# Patient Record
Sex: Male | Born: 1950 | Race: White | Hispanic: No | Marital: Single | Smoking: Former smoker
Health system: Southern US, Community
[De-identification: ages and names within clinical notes are randomized; demographics above are authoritative.]

## PROBLEM LIST (undated history)

## (undated) DIAGNOSIS — K219 Gastro-esophageal reflux disease without esophagitis: Secondary | ICD-10-CM

## (undated) DIAGNOSIS — IMO0001 Reserved for inherently not codable concepts without codable children: Secondary | ICD-10-CM

## (undated) DIAGNOSIS — F17200 Nicotine dependence, unspecified, uncomplicated: Secondary | ICD-10-CM

## (undated) DIAGNOSIS — I639 Cerebral infarction, unspecified: Secondary | ICD-10-CM

## (undated) DIAGNOSIS — F419 Anxiety disorder, unspecified: Secondary | ICD-10-CM

## (undated) DIAGNOSIS — I509 Heart failure, unspecified: Secondary | ICD-10-CM

## (undated) HISTORY — PX: LEG SURGERY: SHX1003

## (undated) HISTORY — PX: SPINE SURGERY: SHX786

---

## 2007-08-19 ENCOUNTER — Inpatient Hospital Stay (HOSPITAL_COMMUNITY): Admission: EM | Admit: 2007-08-19 | Discharge: 2007-08-23 | Payer: Self-pay | Admitting: Emergency Medicine

## 2007-08-22 ENCOUNTER — Ambulatory Visit: Payer: Self-pay | Admitting: Physical Medicine & Rehabilitation

## 2007-10-30 ENCOUNTER — Encounter: Admission: RE | Admit: 2007-10-30 | Discharge: 2007-11-28 | Payer: Self-pay | Admitting: Neurosurgery

## 2009-12-06 ENCOUNTER — Emergency Department (HOSPITAL_BASED_OUTPATIENT_CLINIC_OR_DEPARTMENT_OTHER): Admission: EM | Admit: 2009-12-06 | Discharge: 2009-12-06 | Payer: Self-pay | Admitting: Emergency Medicine

## 2009-12-06 ENCOUNTER — Ambulatory Visit: Payer: Self-pay | Admitting: Diagnostic Radiology

## 2010-01-11 ENCOUNTER — Encounter: Admission: RE | Admit: 2010-01-11 | Discharge: 2010-03-05 | Payer: Self-pay | Admitting: Orthopedic Surgery

## 2010-11-09 NOTE — Op Note (Signed)
[name] [name], [name] [name]                  ACCOUNT NO.:  [id number]   MEDICAL RECORD NO.:  [id number]          PATIENT TYPE:  INP   LOCATION:  3002                         FACILITY:  [facility]   PHYSICIAN:  Clydene Fake, M.D.  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   PROCEDURE:  Anterior cervical decompression, diskectomy, and fusion at  C3-4, with LifeNet allograft bone.  Trestle anterior cervical plate.   SURGEON:  Clydene Fake, M.D.   ASSISTANT:  Stefani Dama, M.D.   ANESTHESIA:  General endotracheal tube anesthesia.   ESTIMATED BLOOD LOSS:  Minimal.   BLOOD GIVEN:  None.   DRAINS:  None.   COMPLICATIONS:  None.   REASON FOR PROCEDURE:  The patient is a 60 year old gentleman who has  had progressive myelopathy.  He had a fall over the weekend and hit the  back of his head that prompted medical attention, and myelopathy was  found.  Workup included MRI of the cervical spine showing spondylytic  change, disc herniation causing cord compression and cord edema at C3-4,  and the patient is now admitted and now brought in for decompression.   PROCEDURE IN DETAIL:  The patient was brought into the operating room,  and anesthesia was induced.  The patient was placed in 10 pounds halter  traction, prepped, draped in a sterile fashion.  Site of incision was  injected with 10 mL of 1% lidocaine with epinephrine.  Incision was then  made from the midline to the anterior border of the sternocleidomastoid  muscle on the left side of the neck.  Incision taken down to the  platysma, and hemostasis obtained with Bovie cauterization.  The  platysma was incised with the Bovie, and blunt dissection  taken through  the anterior cervical fascia  to the anterior cervical spine.  Needle  was placed in the interspace.  X-rays were obtained showing we were at  the C3-4 interspace.  The disc space was incised with a 15 blade as the  needle was removed, and partial diskectomy performed with pituitary  rongeurs.  Longus colli muscle was reflected laterally on each side  using the Bovie, and self-retaining retractors were placed.  Diskectomy  continue with pituitary rongeurs, curettes, and Kerrison punches used to  remove anterior osteophytes.  Distraction pins placed in C3-4 and the  interspace distracted.  The microscope was brought in for  microdissection, and diskectomy continued with pituitary rongeurs and  the curettes.  1 and 2 mm Kerrison punches were used to remove posterior  disc, ligament, and osteophytes, decompressing the central canal and  performing bilateral foraminotomies.  Significant disk herniation to the  right side of the right foramen was seen.  This was all removed.  When  we were finished, we had good central decompression of the spinal cord,  and the  bilateral foramen were opened.  We used a high-speed drill to  remove cartilaginous endplate and prepared for interbody fusion.  We  measured the height of the disc space to be 6 mm, and a 6 mm LifeNet  allograft bone was tapped into place, countersunk a couple millimeters.  The distraction pins were removed.  Hemostasis obtained with Gelfoam and  thrombin.  The weight was removed from the traction.  A Trestle anterior  cervical plate was placed over the anterior cervical spine, and two  screws placed in the C3, and two in the C4.  These were tightened down.  Lateral x-rays were obtained showing good position of plates, screws,  and bone plug.  We had good hemostasis.  The wound was irrigated with  antibiotic solution, and the platysma was closed with 3-0 Vicryl with  interrupted sutures.  Subcutaneous tissue was closed  with the same.  The  skin was closed with benzoin and Steri-Strips.  Dressing was placed.  The patient was placed in a soft cervical collar, awakened from  anesthesia, and transferred to the recovery room in stable condition.           ______________________________  Clydene Fake, M.D.     JRH/MEDQ  D:  08/21/2007  T:  08/22/2007  Job:  [id number]

## 2010-11-12 NOTE — Discharge Summary (Signed)
[name], [name] [name]                  ACCOUNT NO.:  [id number]   MEDICAL RECORD NO.:  [id number]          PATIENT TYPE:  INP   LOCATION:  3002                         FACILITY:  [facility]   PHYSICIAN:  Clydene Fake, M.D.  DATE OF BIRTH:  [birth year]   DATE OF ADMISSION:  08/19/2007  DATE OF DISCHARGE:  08/23/2007                               DISCHARGE SUMMARY   ADMITTING DIAGNOSES:  1. Cervical stenosis.  2. Spondylosis.  3. Cord compression.  4. Myelopathy.   DISCHARGE DIAGNOSES:  1. Cervical stenosis.  2. Spondylosis.  3. Cord compression.  4. Myelopathy.   PROCEDURE:  Intracervical discectomy and fusion C3-C4 LifeNet bone  trestle anterior cervical plate.   REASON FOR ADMISSION:  The patient is a 60 year old white male with long  history of progressive upper and lower extremity clumsiness, weakness,  worsening over the last 3-4 weeks prior to admission.  He had a fall on  day of admission with a laceration.  Actually  he was found to be  extremely spastic when brought to the emergency room.  He had an MRI of  C-spine that was done.  CT of the head was negative.  C-spine showed  multiple degenerative changes with severe stenosis at C3-C4 with cord  compression, cord change, and the patient was admitted for IV steroids,  then prepared for surgical  intervention.   HOSPITAL COURSE:  The patient was admitted and remained stable on the  September 17, 2007, and on September 18, 2007, he underwent surgical  intervention of ACF and C3-C4 and did well.  Postop, he was transferred  to recovery room, then to floor.  On September 19, 2007, he was swallowing  well.  Voicewas doing well.  He was still _________ but he was moving  upper and lower extremities slightly better.  Worked on PT/OT consults,  trying to increase activity and he made some progress there.  Rehab was  consulted,  but thought he was doing too well and thought he would do  well with  home therapy.  On September 20, 2007, rapid  alternating  movements were improved, gait was steady, but improved.  Incision was  clean, dry, and intact.  He was eating well.  He was discharged to home  in a stable condition.  He was discharged with home PT, rolling walker;  medications are:  Wellbutrin, Percocet p.r.n., Flexeril p.r.n.  Follow  up will be in 3-4 weeks in our office.   Note:  His history of alcohol abuse and significant alcohol use so was  treated with alcohol withdrawal protocol during the hospitalization with  no problems.          ______________________________  Clydene Fake, M.D.    JRH/MEDQ  D:  09/20/2007  T:  09/21/2007  Job:  [id number]

## 2011-03-21 LAB — BASIC METABOLIC PANEL
BUN: 14
CO2: 24
Calcium: 8.9
Calcium: 9.4
Chloride: 106
Creatinine, Ser: 0.7
Creatinine, Ser: 0.71
GFR calc non Af Amer: >60
Glucose, Bld: 100 — ABNORMAL HIGH
Sodium: 139

## 2011-03-21 LAB — I-STAT 8, (EC8 V) (CONVERTED LAB)
Acid-base deficit: 2
BUN: 14
Glucose, Bld: 89
Operator id: 284141
Sodium: 136
TCO2: 20
pCO2, Ven: 26.4 — ABNORMAL LOW
pH, Ven: 7.475 — ABNORMAL HIGH

## 2011-03-21 LAB — PROTIME-INR: Prothrombin Time: 12.8

## 2011-03-21 LAB — APTT: aPTT: 26

## 2011-03-21 LAB — POCT CARDIAC MARKERS
Myoglobin, poc: 227
Operator id: 284141

## 2011-03-21 LAB — CBC
Hemoglobin: 14.1
MCV: 101.5 — ABNORMAL HIGH
RBC: 3.98 — ABNORMAL LOW
RDW: 12.7

## 2011-03-21 LAB — POCT I-STAT CREATININE
Creatinine, Ser: 0.9
Operator id: 284141

## 2014-07-21 ENCOUNTER — Inpatient Hospital Stay (HOSPITAL_COMMUNITY)
Admission: EM | Admit: 2014-07-21 | Discharge: 2014-08-04 | DRG: 291 | Disposition: A | Discharge status: Home Health | Payer: Self-pay | Attending: Family Medicine | Admitting: Family Medicine

## 2014-07-21 ENCOUNTER — Encounter (HOSPITAL_COMMUNITY): Payer: Self-pay | Admitting: Emergency Medicine

## 2014-07-21 ENCOUNTER — Emergency Department (HOSPITAL_COMMUNITY): Payer: Self-pay

## 2014-07-21 DIAGNOSIS — D539 Nutritional anemia, unspecified: Secondary | ICD-10-CM | POA: Diagnosis present

## 2014-07-21 DIAGNOSIS — E46 Unspecified protein-calorie malnutrition: Secondary | ICD-10-CM | POA: Diagnosis present

## 2014-07-21 DIAGNOSIS — F1010 Alcohol abuse, uncomplicated: Secondary | ICD-10-CM | POA: Insufficient documentation

## 2014-07-21 DIAGNOSIS — I10 Essential (primary) hypertension: Secondary | ICD-10-CM | POA: Diagnosis present

## 2014-07-21 DIAGNOSIS — D6959 Other secondary thrombocytopenia: Secondary | ICD-10-CM | POA: Diagnosis present

## 2014-07-21 DIAGNOSIS — Z8249 Family history of ischemic heart disease and other diseases of the circulatory system: Secondary | ICD-10-CM

## 2014-07-21 DIAGNOSIS — J9601 Acute respiratory failure with hypoxia: Secondary | ICD-10-CM | POA: Diagnosis present

## 2014-07-21 DIAGNOSIS — I248 Other forms of acute ischemic heart disease: Secondary | ICD-10-CM | POA: Diagnosis present

## 2014-07-21 DIAGNOSIS — R0602 Shortness of breath: Secondary | ICD-10-CM | POA: Insufficient documentation

## 2014-07-21 DIAGNOSIS — R0600 Dyspnea, unspecified: Secondary | ICD-10-CM

## 2014-07-21 DIAGNOSIS — Z452 Encounter for adjustment and management of vascular access device: Secondary | ICD-10-CM

## 2014-07-21 DIAGNOSIS — R404 Transient alteration of awareness: Secondary | ICD-10-CM | POA: Insufficient documentation

## 2014-07-21 DIAGNOSIS — N179 Acute kidney failure, unspecified: Secondary | ICD-10-CM | POA: Diagnosis not present

## 2014-07-21 DIAGNOSIS — E874 Mixed disorder of acid-base balance: Secondary | ICD-10-CM | POA: Diagnosis not present

## 2014-07-21 DIAGNOSIS — F04 Amnestic disorder due to known physiological condition: Secondary | ICD-10-CM | POA: Insufficient documentation

## 2014-07-21 DIAGNOSIS — J449 Chronic obstructive pulmonary disease, unspecified: Secondary | ICD-10-CM | POA: Diagnosis present

## 2014-07-21 DIAGNOSIS — R9431 Abnormal electrocardiogram [ECG] [EKG]: Secondary | ICD-10-CM | POA: Insufficient documentation

## 2014-07-21 DIAGNOSIS — E876 Hypokalemia: Secondary | ICD-10-CM | POA: Diagnosis not present

## 2014-07-21 DIAGNOSIS — F17210 Nicotine dependence, cigarettes, uncomplicated: Secondary | ICD-10-CM | POA: Diagnosis present

## 2014-07-21 DIAGNOSIS — J9600 Acute respiratory failure, unspecified whether with hypoxia or hypercapnia: Secondary | ICD-10-CM

## 2014-07-21 DIAGNOSIS — I426 Alcoholic cardiomyopathy: Secondary | ICD-10-CM | POA: Diagnosis present

## 2014-07-21 DIAGNOSIS — F10239 Alcohol dependence with withdrawal, unspecified: Secondary | ICD-10-CM | POA: Diagnosis not present

## 2014-07-21 DIAGNOSIS — E162 Hypoglycemia, unspecified: Secondary | ICD-10-CM | POA: Diagnosis not present

## 2014-07-21 DIAGNOSIS — Z6824 Body mass index (BMI) 24.0-24.9, adult: Secondary | ICD-10-CM

## 2014-07-21 DIAGNOSIS — J9690 Respiratory failure, unspecified, unspecified whether with hypoxia or hypercapnia: Secondary | ICD-10-CM

## 2014-07-21 DIAGNOSIS — G9341 Metabolic encephalopathy: Secondary | ICD-10-CM | POA: Diagnosis not present

## 2014-07-21 DIAGNOSIS — I4581 Long QT syndrome: Secondary | ICD-10-CM | POA: Diagnosis not present

## 2014-07-21 DIAGNOSIS — F10259 Alcohol dependence with alcohol-induced psychotic disorder, unspecified: Secondary | ICD-10-CM | POA: Diagnosis present

## 2014-07-21 DIAGNOSIS — J189 Pneumonia, unspecified organism: Secondary | ICD-10-CM | POA: Insufficient documentation

## 2014-07-21 DIAGNOSIS — I4891 Unspecified atrial fibrillation: Secondary | ICD-10-CM | POA: Diagnosis not present

## 2014-07-21 DIAGNOSIS — F10939 Alcohol use, unspecified with withdrawal, unspecified: Secondary | ICD-10-CM

## 2014-07-21 DIAGNOSIS — R4182 Altered mental status, unspecified: Secondary | ICD-10-CM

## 2014-07-21 DIAGNOSIS — I5023 Acute on chronic systolic (congestive) heart failure: Principal | ICD-10-CM | POA: Diagnosis present

## 2014-07-21 DIAGNOSIS — I509 Heart failure, unspecified: Secondary | ICD-10-CM

## 2014-07-21 DIAGNOSIS — J811 Chronic pulmonary edema: Secondary | ICD-10-CM

## 2014-07-21 DIAGNOSIS — D696 Thrombocytopenia, unspecified: Secondary | ICD-10-CM | POA: Insufficient documentation

## 2014-07-21 HISTORY — DX: Gastro-esophageal reflux disease without esophagitis: K21.9

## 2014-07-21 HISTORY — DX: Heart failure, unspecified: I50.9

## 2014-07-21 HISTORY — DX: Nicotine dependence, unspecified, uncomplicated: F17.200

## 2014-07-21 HISTORY — DX: Anxiety disorder, unspecified: F41.9

## 2014-07-21 HISTORY — DX: Reserved for inherently not codable concepts without codable children: IMO0001

## 2014-07-21 HISTORY — DX: Cerebral infarction, unspecified: I63.9

## 2014-07-21 LAB — COMPREHENSIVE METABOLIC PANEL
ALT: 30 U/L (ref 0–53)
ANION GAP: 11 (ref 5–15)
AST: 51 U/L — ABNORMAL HIGH (ref 0–37)
Albumin: 3.3 g/dL — ABNORMAL LOW (ref 3.5–5.2)
Alkaline Phosphatase: 78 U/L (ref 39–117)
BUN: 42 mg/dL — AB (ref 6–23)
CALCIUM: 9.3 mg/dL (ref 8.4–10.5)
CHLORIDE: 95 mmol/L — AB (ref 96–112)
CO2: 32 mmol/L (ref 19–32)
Creatinine, Ser: 1.31 mg/dL (ref 0.50–1.35)
GFR calc Af Amer: 65 mL/min — ABNORMAL LOW (ref >90)
GFR calc non Af Amer: 56 mL/min — ABNORMAL LOW (ref >90)
Glucose, Bld: 106 mg/dL — ABNORMAL HIGH (ref 70–99)
Potassium: 4 mmol/L (ref 3.5–5.1)
SODIUM: 138 mmol/L (ref 135–145)
TOTAL PROTEIN: 7.8 g/dL (ref 6.0–8.3)
Total Bilirubin: 2 mg/dL — ABNORMAL HIGH (ref 0.3–1.2)

## 2014-07-21 LAB — I-STAT ARTERIAL BLOOD GAS, ED
ACID-BASE EXCESS: 4 mmol/L — AB (ref 0.0–2.0)
Bicarbonate: 33.7 mEq/L — ABNORMAL HIGH (ref 20.0–24.0)
O2 Saturation: 97 %
PO2 ART: 98 mmHg (ref 80.0–100.0)
Patient temperature: 98.6
TCO2: 36 mmol/L (ref 0–100)
pCO2 arterial: 66.6 mmHg (ref 35.0–45.0)
pH, Arterial: 7.312 — ABNORMAL LOW (ref 7.350–7.450)

## 2014-07-21 LAB — CBC WITH DIFFERENTIAL/PLATELET
Basophils Absolute: 0 10*3/uL (ref 0.0–0.1)
Basophils Relative: 0 % (ref 0–1)
EOS ABS: 0 10*3/uL (ref 0.0–0.7)
EOS PCT: 0 % (ref 0–5)
HCT: 50.6 % (ref 39.0–52.0)
HEMOGLOBIN: 17.2 g/dL — AB (ref 13.0–17.0)
Lymphocytes Relative: 10 % — ABNORMAL LOW (ref 12–46)
Lymphs Abs: 0.9 10*3/uL (ref 0.7–4.0)
MCH: 36.6 pg — ABNORMAL HIGH (ref 26.0–34.0)
MCHC: 34 g/dL (ref 30.0–36.0)
MCV: 107.7 fL — AB (ref 78.0–100.0)
MONO ABS: 1 10*3/uL (ref 0.1–1.0)
Monocytes Relative: 12 % (ref 3–12)
Neutro Abs: 6.7 10*3/uL (ref 1.7–7.7)
Neutrophils Relative %: 78 % — ABNORMAL HIGH (ref 43–77)
PLATELETS: 127 10*3/uL — AB (ref 150–400)
RBC: 4.7 MIL/uL (ref 4.22–5.81)
RDW: 14.4 % (ref 11.5–15.5)
WBC: 8.5 10*3/uL (ref 4.0–10.5)

## 2014-07-21 LAB — AMMONIA: Ammonia: 21 umol/L (ref 11–32)

## 2014-07-21 LAB — PROTIME-INR
INR: 1.35 (ref 0.00–1.49)
PROTHROMBIN TIME: 16.8 s — AB (ref 11.6–15.2)

## 2014-07-21 LAB — TROPONIN I: Troponin I: 0.21 ng/mL — ABNORMAL HIGH (ref <0.031)

## 2014-07-21 LAB — BRAIN NATRIURETIC PEPTIDE: B NATRIURETIC PEPTIDE 5: 1685.5 pg/mL — AB (ref 0.0–100.0)

## 2014-07-21 MED ORDER — DEXTROSE 5 % IV SOLN
1.0000 g | Freq: Once | INTRAVENOUS | Status: AC
  Administered 2014-07-21: 1 g via INTRAVENOUS
  Filled 2014-07-21: qty 10

## 2014-07-21 MED ORDER — FUROSEMIDE 10 MG/ML IJ SOLN
40.0000 mg | Freq: Once | INTRAMUSCULAR | Status: AC
  Administered 2014-07-21: 40 mg via INTRAVENOUS
  Filled 2014-07-21: qty 4

## 2014-07-21 MED ORDER — LORAZEPAM 2 MG/ML IJ SOLN
1.0000 mg | Freq: Once | INTRAMUSCULAR | Status: AC
  Administered 2014-07-21: 1 mg via INTRAVENOUS
  Filled 2014-07-21: qty 1

## 2014-07-21 MED ORDER — DEXTROSE 5 % IV SOLN: 500.0000 mg | Freq: Once | INTRAVENOUS | Status: DC

## 2014-07-21 NOTE — ED Notes (Signed)
Pt. slid from a recliner at home and landed on the floor , SOB with edema at extremities and abdomen . O2 sat= 84% room air at triage .

## 2014-07-21 NOTE — ED Notes (Signed)
Pt monitored by pulse ox, bp cuff, and 12-lead. 

## 2014-07-21 NOTE — ED Provider Notes (Signed)
CSN: [id number]     Arrival date & time 07/21/14  2021 History   First MD Initiated Contact with Patient 07/21/14 2044     Chief Complaint  Patient presents with  . Fall  . Shortness of Breath     (Consider location/radiation/quality/duration/timing/severity/associated sxs/prior Treatment) HPI Comments: Patient has noted increasing generalized edema for the past several months.    Patient is a 64 y.o. male presenting with shortness of breath. The history is provided by the patient and a relative. No language interpreter was used.  Shortness of Breath Severity:  Severe Onset quality:  Gradual Timing:  Constant Progression:  Worsening Chronicity:  New Relieved by:  Nothing Worsened by:  Nothing tried Ineffective treatments:  None tried Associated symptoms: wheezing   Associated symptoms: no abdominal pain, no chest pain, no cough, no fever, no neck pain, no sore throat, no sputum production and no vomiting   Wheezing:    Severity:  Moderate   Onset quality:  Gradual   Timing:  Constant   Progression:  Unchanged   Past Medical History  Diagnosis Date  . Heavy smoker   . CHF (congestive heart failure)   . Anxiety   . Shortness of breath dyspnea   . GERD (gastroesophageal reflux disease)    Past Surgical History  Procedure Laterality Date  . Spine surgery    . Leg surgery     History reviewed. No pertinent family history. History  Substance Use Topics  . Smoking status: Current Every Day Smoker    Types: Cigarettes  . Smokeless tobacco: Not on file  . Alcohol Use: 105.0 oz/week    175 Cans of beer per week    Review of Systems  Constitutional: Negative for fever and chills.  HENT: Negative for sore throat.   Respiratory: Positive for shortness of breath and wheezing. Negative for cough and sputum production.   Cardiovascular: Positive for leg swelling. Negative for chest pain.  Gastrointestinal: Negative for vomiting and abdominal pain.  Genitourinary: Negative  for dysuria, urgency and frequency.  Musculoskeletal: Negative for neck pain.  All other systems reviewed and are negative.     Allergies  Review of patient's allergies indicates no known allergies.  Home Medications   Prior to Admission medications   Medication Sig Start Date End Date Taking? Authorizing Provider  naproxen sodium (ANAPROX) 220 MG tablet Take 440 mg by mouth 2 (two) times daily as needed (pain).   Yes Historical Provider, MD   BP 147/92 mmHg  Pulse 105  Temp(Src) 97.6 F (36.4 C) (Axillary)  Resp 18  Ht 6' (1.829 m)  Wt 200 lb 6.4 oz (90.9 kg)  BMI 27.17 kg/m2  SpO2 97% Physical Exam  Constitutional: He is oriented to person, place, and time. He appears well-developed and well-nourished. No distress.  HENT:  Head: Normocephalic and atraumatic.  Eyes: Pupils are equal, round, and reactive to light.  Neck: Normal range of motion.  Cardiovascular: Normal rate, regular rhythm and normal heart sounds.   Pulses:      Radial pulses are 2+ on the right side, and 2+ on the left side.       Dorsalis pedis pulses are 0 on the right side, and 0 on the left side.       Posterior tibial pulses are 0 on the right side, and 0 on the left side.  Generalized edema worse in the sacrum and back.  Able to doppler DP pulses to bilateral feet.  Unable to doppler bilateral  PT pulses.    Pulmonary/Chest: Effort normal. No respiratory distress. He has no decreased breath sounds. He has wheezes in the right upper field, the right middle field, the left upper field, the left middle field and the left lower field. He has rhonchi in the right upper field, the right middle field, the right lower field, the left upper field, the left middle field and the left lower field. He has no rales.  Abdominal: Soft. He exhibits no distension. There is no tenderness. There is no rebound and no guarding.  Musculoskeletal: He exhibits no edema or tenderness.  Neurological: He is alert and oriented to  person, place, and time. He exhibits normal muscle tone.  Skin: Skin is warm and dry.  Erythema to the chest, abdomen and the bilateral legs above the knees.   Nursing note and vitals reviewed.   ED Course  Procedures (including critical care time) Labs Review Labs Reviewed  CBC WITH DIFFERENTIAL/PLATELET - Abnormal; Notable for the following:    Hemoglobin 17.2 (*)    MCV 107.7 (*)    MCH 36.6 (*)    Platelets 127 (*)    Neutrophils Relative % 78 (*)    Lymphocytes Relative 10 (*)    All other components within normal limits  COMPREHENSIVE METABOLIC PANEL - Abnormal; Notable for the following:    Chloride 95 (*)    Glucose, Bld 106 (*)    BUN 42 (*)    Albumin 3.3 (*)    AST 51 (*)    Total Bilirubin 2.0 (*)    GFR calc non Af Amer 56 (*)    GFR calc Af Amer 65 (*)    All other components within normal limits  BRAIN NATRIURETIC PEPTIDE - Abnormal; Notable for the following:    B Natriuretic Peptide 1685.5 (*)    All other components within normal limits  PROTIME-INR - Abnormal; Notable for the following:    Prothrombin Time 16.8 (*)    All other components within normal limits  TROPONIN I - Abnormal; Notable for the following:    Troponin I 0.21 (*)    All other components within normal limits  CBC - Abnormal; Notable for the following:    MCV 109.0 (*)    MCH 35.6 (*)    Platelets 110 (*)    All other components within normal limits  BASIC METABOLIC PANEL - Abnormal; Notable for the following:    BUN 40 (*)    GFR calc non Af Amer 66 (*)    GFR calc Af Amer 77 (*)    All other components within normal limits  TROPONIN I - Abnormal; Notable for the following:    Troponin I 0.21 (*)    All other components within normal limits  TROPONIN I - Abnormal; Notable for the following:    Troponin I 0.24 (*)    All other components within normal limits  LIPID PANEL - Abnormal; Notable for the following:    HDL 12 (*)    All other components within normal limits  BLOOD  GAS, ARTERIAL - Abnormal; Notable for the following:    pH, Arterial 7.296 (*)    pCO2 arterial 71.3 (*)    Bicarbonate 33.7 (*)    Acid-Base Excess 7.4 (*)    All other components within normal limits  I-STAT ARTERIAL BLOOD GAS, ED - Abnormal; Notable for the following:    pH, Arterial 7.312 (*)    pCO2 arterial 66.6 (*)    Bicarbonate  33.7 (*)    Acid-Base Excess 4.0 (*)    All other components within normal limits  MRSA PCR SCREENING  CULTURE, BLOOD (ROUTINE X 2)  CULTURE, BLOOD (ROUTINE X 2)  MRSA PCR SCREENING  AMMONIA  TSH  TROPONIN I  HEMOGLOBIN A1C  BLOOD GAS, ARTERIAL  URINALYSIS, ROUTINE W REFLEX MICROSCOPIC    Imaging Review Ct Angio Chest Pe W/cm &/or Wo Cm  07/22/2014   CLINICAL DATA:  Progressive dyspnea, swelling, and lethargy for 2 weeks. Initial symptoms 2 months ago.  EXAM: CT ANGIOGRAPHY CHEST WITH CONTRAST  TECHNIQUE: Multidetector CT imaging of the chest was performed using the standard protocol during bolus administration of intravenous contrast. Multiplanar CT image reconstructions and MIPs were obtained to evaluate the vascular anatomy.  CONTRAST:  80mL OMNIPAQUE IOHEXOL 350 MG/ML SOLN  COMPARISON:  None.  FINDINGS: Technically adequate study with good opacification of the central and segmental pulmonary arteries. No focal filling defects demonstrated. No evidence of significant pulmonary embolus.  Cardiac enlargement. Normal caliber thoracic aorta with calcification. Esophagus is decompressed. No significant lymphadenopathy in the chest.  Bilateral pleural effusions with basilar atelectasis or consolidation, greater on the right. Emphysematous changes throughout the lungs. No pneumothorax.  Included portions of the upper abdominal organs demonstrate diffuse abdominal ascites. Reflux of contrast material into the hepatic veins is consistent with passive congestion. Degenerative changes in the spine. No destructive bone lesions.  Review of the MIP images confirms the  above findings.  IMPRESSION: No evidence of significant pulmonary embolus. Cardiac enlargement with passive congestion of the liver. Bilateral pleural effusions and basilar atelectasis or consolidation, greater on the right. Findings suggest congestive heart failure. Diffuse abdominal ascites.   Electronically Signed   By: Burman Nieves M.D.   On: 07/22/2014 04:03   Dg Chest Port 1 View  07/22/2014   CLINICAL DATA:  LEFT jugular line, intubation, abdominal distension, acute respiratory failure, smoker, COPD, GERD, CHF, asthma  EXAM: PORTABLE CHEST - 1 VIEW  COMPARISON:  Portable exam 1147 hr compared to 07/21/2014  FINDINGS: Tip of endotracheal tube projects approximately 6.1 cm above carina.  Nasogastric tube extends into abdomen.  LEFT jugular central venous catheter tip projects over SVC.  Enlargement of cardiac silhouette with pulmonary vascular congestion.  Perihilar infiltrates bilaterally with RIGHT pleural effusion identified, favor pulmonary edema.  Atherosclerotic calcification aorta.  No pneumothorax or acute osseous findings.  IMPRESSION: No pneumothorax following central line placement.  Probable CHF with small RIGHT pleural effusion.   Electronically Signed   By: Ulyses Southward M.D.   On: 07/22/2014 12:18   Dg Chest Port 1 View  07/21/2014   CLINICAL DATA:  64 year old male with respiratory distress and shortness of breath for 1 day. Initial encounter.  EXAM: PORTABLE CHEST - 1 VIEW  COMPARISON:  08/19/2007.  FINDINGS: Portable AP view at 2043 hrs. New patchy in veiling opacity at both lung bases. Increased pulmonary vascularity. No pneumothorax. No definite consolidation. Calcified atherosclerosis of the aorta. Normal cardiac size and mediastinal contours. Visualized tracheal air column is within normal limits.  IMPRESSION: Increased pulmonary vascularity with patchy and veiling opacity at both lung bases.  Top differential considerations include pulmonary interstitial edema with pleural  effusions and bibasilar pneumonia.   Electronically Signed   By: Augusto Gamble M.D.   On: 07/21/2014 21:26     EKG Interpretation   Date/Time:  Monday July 21 2014 20:29:51 EST Ventricular Rate:  116 PR Interval:  194 QRS Duration: 114 QT Interval:  336 QTC Calculation: 467 R Axis:   -82 Text Interpretation:  Sinus tachycardia with Premature supraventricular  complexes and with occasional Premature ventricular complexes Left axis  deviation Low voltage QRS RSR' or QR pattern in V1 suggests right  ventricular conduction delay Inferior infarct , age undetermined  Anterolateral infarct , age undetermined Abnormal ECG Confirmed by RAY MD,  Duwayne HeckANIELLE [phone]([id number]) on 07/21/2014 8:34:04 PM      MDM   Final diagnoses:  SOB (shortness of breath)  Dyspnea  Acute respiratory failure  Encounter for central line placement  Acute respiratory failure with hypoxia   Patient is a 64 year old Caucasian male with no pertinent past medical history but no previous visits to a physician in the past several years who comes to the emergency department today after he slid out of his chair and was unable to get up. Patient has been having progressive generalized edema for the past several months which has not yet been evaluated. Patient was found to be hypoxic with an O2 saturation around 70% in triage. Patient was placed on a nonrebreather with improvement of his oxygen saturations to 97%. Physical exam as above. Patient is in moderate respiratory distress with significant crackles throughout all lung fields and significant edema concerning for liver failure, renal failure, heart failure, COPD, or pneumonia. Initial workup included a chest x-ray, ABG, ammonia, INR, troponin, CBC, CMP, EKG, and a BNP. Chest x-ray with significant pulmonary congestion versus consolidations. As result patient was treated with azithromycin and Rocephin for potential community-acquired pneumonia and blood cultures 2 are drawn. With  patient's pulmonary congestion a 40 mg dose of IV Lasix was administered. The remainder of the workup demonstrated a BNP which is elevated at 1600. Unremarkable CBC with hemoglobin of 17.2. Troponin was elevated at 0.21. An ABG demonstrated a PCO2 of 66 and pH of 7.31. The patient's elevated troponin is likely secondary to demand from his pulmonary congestion and potential pneumonia. Patient improved significantly after being placed on BiPAP with a decrease in his respiratory rate down into the teens and decrease of his heart rate from the 120s down into the 90s. I feel the patient requires admission to the stepdown unit for potential heart failure versus pneumonia or COPD exacerbation. The patient was admitted to the medicine service in a guarded stable condition. Labs and imaging reviewed by myself and considered in medical decision making. Imaging was interpreted by Radiology. Care was discussed with my attending Dr. Rosalia Hammersay.       Bethann BerkshireAaron Blenda Wisecup, MD 07/22/14 1400  Hilario Quarryanielle S Ray, MD 07/22/14 [phone]

## 2014-07-21 NOTE — H&P (Signed)
Family Medicine Teaching [facility] Admission History and Physical Service Pager: [phone]  Patient name: [name] [name] Hartzog Medical record number: [id number] Date of birth: Apr 30, 1951 Age: 64 y.o. Gender: male  Primary Care Provider: No primary care provider on file. Consultants: Cards Code Status: Full  Chief Complaint: SOB and swelling  Assessment and Plan: [name] [name] Wist is a 64 y.o. male presenting with edema and dyspnea. No past medical history.  Pulm: A: -Pulmonary edema- seen on CXR likely related to new onset CHF -Likely COPD - compensated CO2 retention with elevated bicarb -Concern for PE - Wells score 3.0; patient in moderate risk group -Tachypnea and hypoxia - elevated bicarb (34) on ABG  P: -CTA ordered due to concern for possible PE - and to further elucidate the source of his infiltrates -Continue on Bipap for CO2 retention -continue Lasix as below -repeat ABG in am and wean off BiPAP as tolerated  CV: A: -New-onset CHF: BNP 1685 - possibly ischemic given extensive smoking history vs non-ischemic given extensive alcohol abuse -HTN -Tachycardia -Abnormal ECG - Sinus tachycardia with Premature supraventricular complexes and with occasional Premature ventricular complexes, Inferior infarct(age undetermined), Anterolateral infarct (age undetermined) -Elevated Trop 0.21 - most likely elevated due to CHF exacerbation  P:  -cardiology consulted; follow-up recs -Continue IV Lasix 40mg  q12 hrs -Echo ordered -strict I/Os  -daily weights -cycle troponins -repeat EKG in am -risk stratification labs ordered: TSH, A1c, lipid panel -monitor BPs considering adding ACEi and b-blocker - though would wait until out of acute phase to initiate beta blocker -monitor on tele  ID: A:  -CAP - opacities at both lung bases concerning for bibasilar PNA.  -No leukocytosis or fevers.   P: -Patient initially given azithro/rocephin in ED however concern for worsening erythema so  discontinued -Abx switched to IV Levaquin -benadryl prn for itching and rash -blood cultures pending  Hepatorenal: A: Abdominal distention concerning for ascites - liver disease possible given history of alcohol abuse Enlarged liver - mildly elevated AST(51)  Elevated BUN/Cr - 42/1.3 - AKI  P: -trend CMET -avoid nephrotoxic agents -consider abdominal imaging if patient develops abdominal pain   Social: A:  -Known alcoholic(25 beers a day) and heavy smoker (1ppd for >8479yrs) -Lives alone with reported weakness  P: -CIWA protocol -PT/OT eval  -nicotine patch -encourage smoking cessation   FEN/GI: NPO/ SLIV Prophylaxis: Heparin Subq  Disposition: Admit to stepdown unit under Dr. Randolm IdolFletke  History of Present Illness: [name] [name] [name] [name] is a 64 y.o. male presenting with edema and dyspnea. Patient has no known medical conditions. He has not seen a doctor in over 7 years. Patient was brought to the hospital by EMS. He states that he has been having increased swelling since Christmas. Then as of 2 weeks ago he started having shortness of breath. Patient states that his shortness of breath worsens when he is lying flat on his back and occasionally wakes him up at night. He also endorses being weak. Denies any falls. Of note friend today found patient on floor. Patient states that he was found about 5-10 minutes later. Patient also states that he is short of breath when he walks. He has had a previous episode last year with the same symptoms but this went away after a short period of time and he had no issues with this until recently.  In the ED, patient was tachypneic and tachycardic. He was hypoxic to 70% on room air. BiPAP was started. Chest x-ray also concerning for pneumonia and pulmonary edema. He was  given Lasix and antibiotics (rocephin and azithro). Blood cultures were also drawn.   Review Of Systems: Per HPI.  Otherwise 12 point review of systems was performed and was  unremarkable.  Patient Active Problem List   Diagnosis Date Noted  . CHF (congestive heart failure) 07/21/2014   Past Medical History: Past Medical History  Diagnosis Date  . Heavy smoker    Past Surgical History: Past Surgical History  Procedure Laterality Date  . Spine surgery    . Leg surgery     Social History: History  Substance Use Topics  . Smoking status: Current Every Day Smoker    Types: Cigarettes  . Smokeless tobacco: Not on file  . Alcohol Use: Yes   Additional social history: Lives at home alone. Alcoholic and Heavy smoker. Denies illicit drug use. Please also refer to relevant sections of EMR.  Family History: No family history on file. Allergies and Medications: No Known Allergies No current facility-administered medications on file prior to encounter.   No current outpatient prescriptions on file prior to encounter.    Objective: BP 161/93 mmHg  Pulse 106  Temp(Src) 97.6 F (36.4 C) (Oral)  Resp 14  Ht 6' (1.829 m)  Wt 180 lb (81.647 kg)  BMI 24.41 kg/m2  SpO2 100% Exam: General: Alert, NAD, cooperative. Appears to have anasarca. HEENT: NCAT, vision grossly intact, PERRLA, no injection and anicteric. MMM. Bipap mask in place.  Lungs: Tachypneic, Coarse breath sounds, comfortable work of breathing, no wheezes appreciated Heart: Tachycardic, Normal heart sounds. Unable to appreciate if any murmur due to BiPAP and coarse breath sounds.  Abdomen: Bowel sounds normal; abdomen distended and nontender. Hepatomegaly.  Pulses: DP/PT are faint bilaterally.  Extremities: Upper and lower extremities with 2+ edema. Neurologic: No focal deficits, A&Ox3.  Skin: Intact. Erythematous rash noted diffusely. Warm and dry.   Labs and Imaging: Results for orders placed or performed during the hospital encounter of 07/21/14 (from the past 24 hour(s))  CBC with Differential     Status: Abnormal   Collection Time: 07/21/14  8:38 PM  Result Value Ref Range   WBC  8.5 4.0 - 10.5 K/uL   RBC 4.70 4.22 - 5.81 MIL/uL   Hemoglobin 17.2 (H) 13.0 - 17.0 g/dL   HCT 16.1 09.6 - 04.5 %   MCV 107.7 (H) 78.0 - 100.0 fL   MCH 36.6 (H) 26.0 - 34.0 pg   MCHC 34.0 30.0 - 36.0 g/dL   RDW 40.9 81.1 - 91.4 %   Platelets 127 (L) 150 - 400 K/uL   Neutrophils Relative % 78 (H) 43 - 77 %   Neutro Abs 6.7 1.7 - 7.7 K/uL   Lymphocytes Relative 10 (L) 12 - 46 %   Lymphs Abs 0.9 0.7 - 4.0 K/uL   Monocytes Relative 12 3 - 12 %   Monocytes Absolute 1.0 0.1 - 1.0 K/uL   Eosinophils Relative 0 0 - 5 %   Eosinophils Absolute 0.0 0.0 - 0.7 K/uL   Basophils Relative 0 0 - 1 %   Basophils Absolute 0.0 0.0 - 0.1 K/uL  Comprehensive metabolic panel     Status: Abnormal   Collection Time: 07/21/14  8:38 PM  Result Value Ref Range   Sodium 138 135 - 145 mmol/L   Potassium 4.0 3.5 - 5.1 mmol/L   Chloride 95 (L) 96 - 112 mmol/L   CO2 32 19 - 32 mmol/L   Glucose, Bld 106 (H) 70 - 99 mg/dL   BUN 42 (  H) 6 - 23 mg/dL   Creatinine, Ser 1.61 0.50 - 1.35 mg/dL   Calcium 9.3 8.4 - 09.6 mg/dL   Total Protein 7.8 6.0 - 8.3 g/dL   Albumin 3.3 (L) 3.5 - 5.2 g/dL   AST 51 (H) 0 - 37 U/L   ALT 30 0 - 53 U/L   Alkaline Phosphatase 78 39 - 117 U/L   Total Bilirubin 2.0 (H) 0.3 - 1.2 mg/dL   GFR calc non Af Amer 56 (L) >90 mL/min   GFR calc Af Amer 65 (L) >90 mL/min   Anion gap 11 5 - 15  Brain natriuretic peptide     Status: Abnormal   Collection Time: 07/21/14  8:38 PM  Result Value Ref Range   B Natriuretic Peptide 1685.5 (H) 0.0 - 100.0 pg/mL  Protime-INR     Status: Abnormal   Collection Time: 07/21/14  9:06 PM  Result Value Ref Range   Prothrombin Time 16.8 (H) 11.6 - 15.2 seconds   INR 1.35 0.00 - 1.49  Troponin I     Status: Abnormal   Collection Time: 07/21/14  9:06 PM  Result Value Ref Range   Troponin I 0.21 (H) <0.031 ng/mL  Ammonia     Status: None   Collection Time: 07/21/14  9:26 PM  Result Value Ref Range   Ammonia 21 11 - 32 umol/L  I-Stat Arterial Blood Gas,  ED - (order at [facility] and [facility] only)     Status: Abnormal   Collection Time: 07/21/14  9:32 PM  Result Value Ref Range   pH, Arterial 7.312 (L) 7.350 - 7.450   pCO2 arterial 66.6 (HH) 35.0 - 45.0 mmHg   pO2, Arterial 98.0 80.0 - 100.0 mmHg   Bicarbonate 33.7 (H) 20.0 - 24.0 mEq/L   TCO2 36 0 - 100 mmol/L   O2 Saturation 97.0 %   Acid-Base Excess 4.0 (H) 0.0 - 2.0 mmol/L   Patient temperature 98.6 F    Collection site RADIAL, ALLEN'S TEST ACCEPTABLE    Drawn by RT    Sample type ARTERIAL    Comment NOTIFIED PHYSICIAN   MRSA PCR Screening     Status: None   Collection Time: 07/22/14 12:46 AM  Result Value Ref Range   MRSA by PCR NEGATIVE NEGATIVE   Dg Chest Port 1 View  07/21/2014   CLINICAL DATA:  64 year old male with respiratory distress and shortness of breath for 1 day. Initial encounter.  EXAM: PORTABLE CHEST - 1 VIEW  COMPARISON:  08/19/2007.  FINDINGS: Portable AP view at 2043 hrs. New patchy in veiling opacity at both lung bases. Increased pulmonary vascularity. No pneumothorax. No definite consolidation. Calcified atherosclerosis of the aorta. Normal cardiac size and mediastinal contours. Visualized tracheal air column is within normal limits.  IMPRESSION: Increased pulmonary vascularity with patchy and veiling opacity at both lung bases.  Top differential considerations include pulmonary interstitial edema with pleural effusions and bibasilar pneumonia.   Electronically Signed   By: Augusto Gamble M.D.   On: 07/21/2014 21:26    Pincus Large, DO 07/22/2014, 2:58 AM PGY-1, [place] [facility] FPTS Intern pager: [phone], text pages welcome  Upper Level Addendum:  I have seen and evaluated this patient along with Dr. Doroteo Glassman and reviewed the above note, making necessary revisions in red.   Marikay Alar, MD Family Medicine PGY-3

## 2014-07-21 NOTE — ED Notes (Signed)
Dr Rosalia Hammersay and Dr Craige CottaSchmitt at bedside.

## 2014-07-22 ENCOUNTER — Encounter (HOSPITAL_COMMUNITY): Payer: Self-pay | Admitting: *Deleted

## 2014-07-22 ENCOUNTER — Inpatient Hospital Stay (HOSPITAL_COMMUNITY): Payer: Self-pay

## 2014-07-22 DIAGNOSIS — I509 Heart failure, unspecified: Secondary | ICD-10-CM

## 2014-07-22 DIAGNOSIS — F10239 Alcohol dependence with withdrawal, unspecified: Secondary | ICD-10-CM

## 2014-07-22 DIAGNOSIS — J189 Pneumonia, unspecified organism: Secondary | ICD-10-CM | POA: Insufficient documentation

## 2014-07-22 DIAGNOSIS — I5022 Chronic systolic (congestive) heart failure: Secondary | ICD-10-CM

## 2014-07-22 DIAGNOSIS — R0600 Dyspnea, unspecified: Secondary | ICD-10-CM

## 2014-07-22 DIAGNOSIS — R7989 Other specified abnormal findings of blood chemistry: Secondary | ICD-10-CM

## 2014-07-22 DIAGNOSIS — J9601 Acute respiratory failure with hypoxia: Secondary | ICD-10-CM

## 2014-07-22 DIAGNOSIS — J9600 Acute respiratory failure, unspecified whether with hypoxia or hypercapnia: Secondary | ICD-10-CM | POA: Insufficient documentation

## 2014-07-22 DIAGNOSIS — J9602 Acute respiratory failure with hypercapnia: Secondary | ICD-10-CM

## 2014-07-22 DIAGNOSIS — I5021 Acute systolic (congestive) heart failure: Secondary | ICD-10-CM

## 2014-07-22 DIAGNOSIS — F10939 Alcohol use, unspecified with withdrawal, unspecified: Secondary | ICD-10-CM

## 2014-07-22 DIAGNOSIS — I426 Alcoholic cardiomyopathy: Secondary | ICD-10-CM

## 2014-07-22 DIAGNOSIS — F10231 Alcohol dependence with withdrawal delirium: Secondary | ICD-10-CM

## 2014-07-22 LAB — CBC
HEMATOCRIT: 46.2 % (ref 39.0–52.0)
HEMOGLOBIN: 15.1 g/dL (ref 13.0–17.0)
MCH: 35.6 pg — AB (ref 26.0–34.0)
MCHC: 32.7 g/dL (ref 30.0–36.0)
MCV: 109 fL — ABNORMAL HIGH (ref 78.0–100.0)
Platelets: 110 10*3/uL — ABNORMAL LOW (ref 150–400)
RBC: 4.24 MIL/uL (ref 4.22–5.81)
RDW: 14.7 % (ref 11.5–15.5)
WBC: 8.3 10*3/uL (ref 4.0–10.5)

## 2014-07-22 LAB — GLUCOSE, CAPILLARY
Glucose-Capillary: 106 mg/dL — ABNORMAL HIGH (ref 70–99)
Glucose-Capillary: 92 mg/dL (ref 70–99)

## 2014-07-22 LAB — BLOOD GAS, ARTERIAL
Acid-Base Excess: 7.4 mmol/L — ABNORMAL HIGH (ref 0.0–2.0)
Bicarbonate: 33.7 mEq/L — ABNORMAL HIGH (ref 20.0–24.0)
Delivery systems: POSITIVE
Drawn by: 281251
EXPIRATORY PAP: 7
FIO2: 0.5 %
Inspiratory PAP: 14
LHR: 15 {breaths}/min
Mode: POSITIVE
O2 Saturation: 95.2 %
PCO2 ART: 71.3 mmHg — AB (ref 35.0–45.0)
PH ART: 7.296 — AB (ref 7.350–7.450)
Patient temperature: 98.6
TCO2: 35.9 mmol/L (ref 0–100)
pO2, Arterial: 82.9 mmHg (ref 80.0–100.0)

## 2014-07-22 LAB — HEMOGLOBIN A1C
Hgb A1c MFr Bld: 6.1 % — ABNORMAL HIGH (ref <5.7)
MEAN PLASMA GLUCOSE: 128 mg/dL — AB (ref <117)

## 2014-07-22 LAB — URINALYSIS, ROUTINE W REFLEX MICROSCOPIC
BILIRUBIN URINE: NEGATIVE
GLUCOSE, UA: NEGATIVE mg/dL
HGB URINE DIPSTICK: NEGATIVE
KETONES UR: NEGATIVE mg/dL
LEUKOCYTES UA: NEGATIVE
Nitrite: NEGATIVE
PH: 5.5 (ref 5.0–8.0)
Protein, ur: NEGATIVE mg/dL
Specific Gravity, Urine: 1.009 (ref 1.005–1.030)
Urobilinogen, UA: 0.2 mg/dL (ref 0.0–1.0)

## 2014-07-22 LAB — POCT I-STAT 3, ART BLOOD GAS (G3+)
ACID-BASE EXCESS: 7 mmol/L — AB (ref 0.0–2.0)
Bicarbonate: 33.9 mEq/L — ABNORMAL HIGH (ref 20.0–24.0)
O2 SAT: 90 %
PH ART: 7.415 (ref 7.350–7.450)
PO2 ART: 60 mmHg — AB (ref 80.0–100.0)
TCO2: 35 mmol/L (ref 0–100)
pCO2 arterial: 52.8 mmHg — ABNORMAL HIGH (ref 35.0–45.0)

## 2014-07-22 LAB — BASIC METABOLIC PANEL
Anion gap: 13 (ref 5–15)
BUN: 40 mg/dL — ABNORMAL HIGH (ref 6–23)
CHLORIDE: 97 mmol/L (ref 96–112)
CO2: 28 mmol/L (ref 19–32)
CREATININE: 1.14 mg/dL (ref 0.50–1.35)
Calcium: 8.5 mg/dL (ref 8.4–10.5)
GFR calc Af Amer: 77 mL/min — ABNORMAL LOW (ref >90)
GFR calc non Af Amer: 66 mL/min — ABNORMAL LOW (ref >90)
Glucose, Bld: 86 mg/dL (ref 70–99)
POTASSIUM: 3.9 mmol/L (ref 3.5–5.1)
Sodium: 138 mmol/L (ref 135–145)

## 2014-07-22 LAB — LIPID PANEL
CHOLESTEROL: 97 mg/dL (ref 0–200)
HDL: 12 mg/dL — AB (ref >39)
LDL Cholesterol: 68 mg/dL (ref 0–99)
TRIGLYCERIDES: 83 mg/dL (ref <150)
Total CHOL/HDL Ratio: 8.1 RATIO
VLDL: 17 mg/dL (ref 0–40)

## 2014-07-22 LAB — TSH: TSH: 2.175 u[IU]/mL (ref 0.350–4.500)

## 2014-07-22 LAB — TROPONIN I
TROPONIN I: 0.21 ng/mL — AB (ref <0.031)
TROPONIN I: 0.23 ng/mL — AB (ref <0.031)
Troponin I: 0.24 ng/mL — ABNORMAL HIGH (ref <0.031)

## 2014-07-22 LAB — MRSA PCR SCREENING
MRSA BY PCR: NEGATIVE
MRSA by PCR: NEGATIVE

## 2014-07-22 MED ORDER — LORAZEPAM 2 MG/ML IJ SOLN
2.0000 mg | INTRAMUSCULAR | Status: DC | PRN
  Administered 2014-07-22 (×2): 2 mg via INTRAVENOUS
  Filled 2014-07-22 (×2): qty 1

## 2014-07-22 MED ORDER — FUROSEMIDE 10 MG/ML IJ SOLN
40.0000 mg | Freq: Two times a day (BID) | INTRAMUSCULAR | Status: DC
  Administered 2014-07-22 – 2014-07-26 (×9): 40 mg via INTRAVENOUS
  Filled 2014-07-22 (×15): qty 4

## 2014-07-22 MED ORDER — FUROSEMIDE 10 MG/ML IJ SOLN: 40.0000 mg | Freq: Two times a day (BID) | INTRAMUSCULAR | Status: DC

## 2014-07-22 MED ORDER — DEXTROSE 5 % IV SOLN
500.0000 mg | INTRAVENOUS | Status: DC
  Administered 2014-07-22: 500 mg via INTRAVENOUS
  Filled 2014-07-22 (×3): qty 500

## 2014-07-22 MED ORDER — SUCCINYLCHOLINE CHLORIDE 20 MG/ML IJ SOLN
INTRAMUSCULAR | Status: AC
  Filled 2014-07-22: qty 1

## 2014-07-22 MED ORDER — IPRATROPIUM-ALBUTEROL 0.5-2.5 (3) MG/3ML IN SOLN
3.0000 mL | RESPIRATORY_TRACT | Status: DC
  Administered 2014-07-22 – 2014-07-25 (×17): 3 mL via RESPIRATORY_TRACT
  Filled 2014-07-22 (×17): qty 3

## 2014-07-22 MED ORDER — LEVOFLOXACIN IN D5W 750 MG/150ML IV SOLN
750.0000 mg | INTRAVENOUS | Status: DC
  Administered 2014-07-22: 750 mg via INTRAVENOUS
  Filled 2014-07-22: qty 150

## 2014-07-22 MED ORDER — FENTANYL CITRATE 0.05 MG/ML IJ SOLN
25.0000 ug | INTRAMUSCULAR | Status: DC | PRN
  Administered 2014-07-22 – 2014-07-23 (×2): 100 ug via INTRAVENOUS
  Filled 2014-07-22 (×2): qty 2

## 2014-07-22 MED ORDER — LIDOCAINE HCL (CARDIAC) 20 MG/ML IV SOLN
INTRAVENOUS | Status: AC
  Filled 2014-07-22: qty 5

## 2014-07-22 MED ORDER — LORAZEPAM 2 MG/ML IJ SOLN
1.0000 mg | INTRAMUSCULAR | Status: DC | PRN
  Administered 2014-07-22: 2 mg via INTRAVENOUS
  Filled 2014-07-22: qty 1

## 2014-07-22 MED ORDER — DIPHENHYDRAMINE HCL 50 MG/ML IJ SOLN: 12.5000 mg | Freq: Four times a day (QID) | INTRAMUSCULAR | Status: DC | PRN

## 2014-07-22 MED ORDER — DEXTROSE-NACL 5-0.9 % IV SOLN
INTRAVENOUS | Status: DC
  Administered 2014-07-22: 13:00:00 via INTRAVENOUS

## 2014-07-22 MED ORDER — DIPHENHYDRAMINE HCL 25 MG PO CAPS
50.0000 mg | ORAL_CAPSULE | Freq: Once | ORAL | Status: DC
  Filled 2014-07-22: qty 1

## 2014-07-22 MED ORDER — CEFTRIAXONE SODIUM IN DEXTROSE 20 MG/ML IV SOLN
1.0000 g | INTRAVENOUS | Status: DC
  Administered 2014-07-22: 1 g via INTRAVENOUS
  Filled 2014-07-22 (×3): qty 50

## 2014-07-22 MED ORDER — SODIUM CHLORIDE 0.9 % IJ SOLN: 3.0000 mL | Freq: Two times a day (BID) | INTRAMUSCULAR | Status: DC

## 2014-07-22 MED ORDER — ETOMIDATE 2 MG/ML IV SOLN
INTRAVENOUS | Status: AC
  Administered 2014-07-22: 20 mg
  Filled 2014-07-22: qty 20

## 2014-07-22 MED ORDER — ALBUTEROL SULFATE (2.5 MG/3ML) 0.083% IN NEBU: 2.5000 mg | INHALATION_SOLUTION | RESPIRATORY_TRACT | Status: DC | PRN

## 2014-07-22 MED ORDER — IOHEXOL 350 MG/ML SOLN
80.0000 mL | Freq: Once | INTRAVENOUS | Status: AC | PRN
  Administered 2014-07-22: 80 mL via INTRAVENOUS

## 2014-07-22 MED ORDER — ADULT MULTIVITAMIN W/MINERALS CH
1.0000 | ORAL_TABLET | Freq: Every day | ORAL | Status: DC
  Administered 2014-07-22 – 2014-08-04 (×14): 1 via ORAL
  Filled 2014-07-22 (×15): qty 1

## 2014-07-22 MED ORDER — SODIUM CHLORIDE 0.9 % IJ SOLN
3.0000 mL | Freq: Two times a day (BID) | INTRAMUSCULAR | Status: DC
  Administered 2014-07-22 – 2014-08-03 (×20): 3 mL via INTRAVENOUS

## 2014-07-22 MED ORDER — CETYLPYRIDINIUM CHLORIDE 0.05 % MT LIQD
7.0000 mL | Freq: Four times a day (QID) | OROMUCOSAL | Status: DC
  Administered 2014-07-23 – 2014-07-24 (×6): 7 mL via OROMUCOSAL

## 2014-07-22 MED ORDER — CHLORHEXIDINE GLUCONATE 0.12 % MT SOLN
15.0000 mL | Freq: Two times a day (BID) | OROMUCOSAL | Status: DC
  Administered 2014-07-22 – 2014-07-24 (×4): 15 mL via OROMUCOSAL
  Filled 2014-07-22 (×4): qty 15

## 2014-07-22 MED ORDER — DEXMEDETOMIDINE HCL IN NACL 200 MCG/50ML IV SOLN
0.2000 ug/kg/h | INTRAVENOUS | Status: AC
  Administered 2014-07-22: 0.2 ug/kg/h via INTRAVENOUS
  Administered 2014-07-22 – 2014-07-23 (×3): 0.4 ug/kg/h via INTRAVENOUS
  Filled 2014-07-22 (×5): qty 50

## 2014-07-22 MED ORDER — SODIUM CHLORIDE 0.9 % IV SOLN
250.0000 mL | INTRAVENOUS | Status: DC | PRN
  Administered 2014-07-25: 250 mL via INTRAVENOUS

## 2014-07-22 MED ORDER — DIPHENHYDRAMINE HCL 50 MG/ML IJ SOLN
50.0000 mg | Freq: Once | INTRAMUSCULAR | Status: AC
Start: 2014-07-22 — End: 2014-07-22
  Administered 2014-07-22: 50 mg via INTRAVENOUS
  Filled 2014-07-22: qty 1

## 2014-07-22 MED ORDER — ROCURONIUM BROMIDE 50 MG/5ML IV SOLN
INTRAVENOUS | Status: AC
  Administered 2014-07-22: 10 mg
  Filled 2014-07-22: qty 2

## 2014-07-22 MED ORDER — MIDAZOLAM HCL 2 MG/2ML IJ SOLN
INTRAMUSCULAR | Status: AC
  Administered 2014-07-22: 2 mg
  Filled 2014-07-22: qty 2

## 2014-07-22 MED ORDER — SODIUM CHLORIDE 0.9 % IJ SOLN: 3.0000 mL | INTRAMUSCULAR | Status: DC | PRN

## 2014-07-22 MED ORDER — DIPHENHYDRAMINE HCL 25 MG PO CAPS: 25.0000 mg | ORAL_CAPSULE | Freq: Four times a day (QID) | ORAL | Status: DC | PRN

## 2014-07-22 MED ORDER — HEPARIN SODIUM (PORCINE) 5000 UNIT/ML IJ SOLN
5000.0000 [IU] | Freq: Three times a day (TID) | INTRAMUSCULAR | Status: DC
  Administered 2014-07-22 – 2014-07-24 (×7): 5000 [IU] via SUBCUTANEOUS
  Filled 2014-07-22 (×10): qty 1

## 2014-07-22 MED ORDER — FENTANYL CITRATE 0.05 MG/ML IJ SOLN
INTRAMUSCULAR | Status: AC
  Administered 2014-07-22: 100 ug
  Filled 2014-07-22: qty 2

## 2014-07-22 MED ORDER — FAMOTIDINE 40 MG/5ML PO SUSR
20.0000 mg | Freq: Two times a day (BID) | ORAL | Status: DC
  Administered 2014-07-22 – 2014-07-23 (×3): 20 mg
  Filled 2014-07-22 (×8): qty 2.5

## 2014-07-22 MED ORDER — VITAMIN B-1 100 MG PO TABS
100.0000 mg | ORAL_TABLET | Freq: Every day | ORAL | Status: DC
  Administered 2014-07-22 – 2014-07-28 (×7): 100 mg via ORAL
  Filled 2014-07-22 (×9): qty 1

## 2014-07-22 MED ORDER — FENTANYL CITRATE 0.05 MG/ML IJ SOLN
25.0000 ug | INTRAMUSCULAR | Status: DC | PRN
  Administered 2014-07-22: 25 ug via INTRAVENOUS
  Filled 2014-07-22: qty 2

## 2014-07-22 MED ORDER — FOLIC ACID 1 MG PO TABS
1.0000 mg | ORAL_TABLET | Freq: Every day | ORAL | Status: DC
  Administered 2014-07-22 – 2014-08-04 (×14): 1 mg via ORAL
  Filled 2014-07-22 (×15): qty 1

## 2014-07-22 NOTE — Progress Notes (Signed)
CRITICAL VALUE ALERT  Critical value received: CO2 71.3  Date of notification:  07/22/2014  Time of notification: 1010  Critical value read back:yes  Nurse who received alert: Legrand Comohristian Alrick Cubbage RN  MD notified (1st page):  [place]  Time of first page:  1015  MD notified (2nd page):  Time of second page:  Responding MD: Antony HasteSonnenburg  Time MD responded:  1017

## 2014-07-22 NOTE — Progress Notes (Signed)
Pt skin observed to be red upon arrival to unit. Family Medicine made aware and benadryl ordered. Pt also appeared to have EKG changes from ST to A.Fib. STAT EKG completed; Family Medicine called. Will continue to monitor pt.

## 2014-07-22 NOTE — Progress Notes (Signed)
Pt's sats decreased to 86% and FiO2 increased to 100%. Awaiting respiratory for transport

## 2014-07-22 NOTE — Care Management Note (Signed)
    Page 1 of 2   08/04/2014     5:17:46 PM CARE MANAGEMENT NOTE 08/04/2014  Patient:  [name] [name]   Account Number:  [id number]  Date Initiated:  07/22/2014  Documentation initiated by:  Junius CreamerWELL,DEBBIE  Subjective/Objective Assessment:   adm w heart failure     Action/Plan:   lives at home alone  pt eval- rec snf- patient refuses.   Anticipated DC Date:  08/04/2014   Anticipated DC Plan:  SKILLED NURSING FACILITY  In-house referral  Clinical Social Worker      DC Associate Professorlanning Services  CM consult  MATCH Program      [facility]  [facility]  DURABLE MEDICAL EQUIPMENT   Choice offered to / List presented to:  C-1 Patient   DME arranged  OXYGEN      DME agency  [facility]     [facility]  HH-1 RN  HH-6 SOCIAL WORKER      [facility]  [facility]   Status of service:  Completed, signed off Medicare Important Message given?  NO (If response is "NO", the following Medicare IM given date fields will be blank) Date Medicare IM given:   Medicare IM given by:   Date Additional Medicare IM given:   Additional Medicare IM given by:    Discharge Disposition:  HOME W HOME HEALTH SERVICES  Per UR Regulation:  Reviewed for med. necessity/level of care/duration of stay  If discussed at Long Length of Stay Meetings, dates discussed:    Comments:  ContactKarl Bales:  FERGUSON,MICHELLE  [phone]    Olivieri,Nancy  08/04/14 1714 Letha Capeeborah Izzabelle Bouley RN, BSN [phone] patient refused snf, NCM set up [facility] and Child psychotherapistocial Worker for [facility], referral made, McCombStephanie notified.  Soc will begin 24-48 hr post dc.  [facility].  Jermaine brought oxygen up to rooom.  Patient states he needs ast with medications, NCM ast patient with Match letter.  [facility].  08/01/14- 1330- Donn PieriniKristi Webster RN, BSN [phone] Pt continues to have safety issues- CSW working on SNF placement for pt- plan is for  SNF when bed available.  07/31/14 Sidney AceJulie Amerson, RN, BSN [phone] Pt transferred to 3S as pt had critical ABG, increased lethargy.  Psych eval pending for capacity.  Will follow progress.  07/29/14 Sidney AceJulie Amerson, RN, BSN [phone]    Pt remains confused.  PT/OT recommending SNF at dc; CSW following for SNF at dc, as pt from home alone.  07-24-14 10:30am Avie ArenasSarah Brown, RNBSN [phone] Self extubated today.  07-22-14  4pm Avie ArenasSarah Brown, RNBSN [phone] Talked with above contacts in room.  Are just friends - patient [place] two [place] aware of admission and will be up later and on in [place] area??  Patient has not been in contact with either brother. Patient lives by self - does drink alot and is a chain smoker. Above friends state they help him out and care for him as much as they can.  CM will continue to follow.

## 2014-07-22 NOTE — Progress Notes (Signed)
Late entry- Entered pt's room at 1035 and pt was combative, removing EKG leads, aggressive with echo tech, and pulling at bipap. Sats 84% and pt struggling to breathe. Family medicine aware. Critical care at bedside and pt intubated at 1110. See MAR for medication administration. Family informed of what is going on and password created. Contact people obtained. Report called and pt will be transported to 16M.

## 2014-07-22 NOTE — Progress Notes (Addendum)
PT Cancellation Note  Patient Details Name: [name] RombergClyde [name] MRN: [id number] DOB: 06/21/1951   Cancelled Treatment:    Reason Eval/Treat Not Completed: Medical issues which prohibited therapy (Pt currently on bipap and also has bedrest orders.) Will need increased activity orders and off of bipap prior to PT eval. Will check back later.   Yoltzin Barg 07/22/2014, 8:31 AM  [facility] [place] [facility] [phone]

## 2014-07-22 NOTE — Progress Notes (Signed)
Echocardiogram 2D Echocardiogram has been performed.  [name] [name] Day 07/22/2014, 11:06 AM

## 2014-07-22 NOTE — Consult Note (Signed)
Reason for Consult: heart failure, elevated troponin Primary Cardiogist: new Referring Physician: Dr. Gerarda Fraction, family medicine  [name] [name] is an 64 y.o. male.  HPI: [name] [name] is a 64 yo with PMH alcohol use (heavy) last use 5 weeks ago and of family history of CAD and ongoing tobacco use who presents via eMS with progressive dyspnea, swelling and lethargy for the last two weeks with initial symptoms probably 1-2 months ago. He has not kept up with a  Physician and he is accompanied by his brother. He's had worsening orthopnea and PND. He was found on the floor at home by a friend confused and the patient thought he had slid out of bed. In our ER he was found to be hypoxi and he was given IV lasix and placed on BIPAP. Cardiology consulted given elevated troponin and BNP. He was also started on IV antibiotics after blood cultures were drawn. No chest pain or syncope.   Past Medical History  Diagnosis Date  . Heavy smoker     Past Surgical History  Procedure Laterality Date  . Spine surgery    . Leg surgery     Family history of CAD  Social History:  reports that he has been smoking Cigarettes.  He does not have any smokeless tobacco history on file. He reports that he drinks alcohol. He reports that he does not use illicit drugs.  Allergies: No Known Allergies  Medications:  I have reviewed the patient's current medications. Prior to Admission:  Prescriptions prior to admission  Medication Sig Dispense Refill Last Dose  . naproxen sodium (ANAPROX) 220 MG tablet Take 440 mg by mouth 2 (two) times daily as needed (pain).   Past Month at Unknown time   Scheduled: . levofloxacin (LEVAQUIN) IV  750 mg Intravenous Q24H    Results for orders placed or performed during the hospital encounter of 07/21/14 (from the past 48 hour(s))  CBC with Differential     Status: Abnormal   Collection Time: 07/21/14  8:38 PM  Result Value Ref Range   WBC 8.5 4.0 - 10.5 K/uL   RBC 4.70 4.22 - 5.81  MIL/uL   Hemoglobin 17.2 (H) 13.0 - 17.0 g/dL   HCT 50.6 39.0 - 52.0 %   MCV 107.7 (H) 78.0 - 100.0 fL   MCH 36.6 (H) 26.0 - 34.0 pg   MCHC 34.0 30.0 - 36.0 g/dL   RDW 14.4 11.5 - 15.5 %   Platelets 127 (L) 150 - 400 K/uL   Neutrophils Relative % 78 (H) 43 - 77 %   Neutro Abs 6.7 1.7 - 7.7 K/uL   Lymphocytes Relative 10 (L) 12 - 46 %   Lymphs Abs 0.9 0.7 - 4.0 K/uL   Monocytes Relative 12 3 - 12 %   Monocytes Absolute 1.0 0.1 - 1.0 K/uL   Eosinophils Relative 0 0 - 5 %   Eosinophils Absolute 0.0 0.0 - 0.7 K/uL   Basophils Relative 0 0 - 1 %   Basophils Absolute 0.0 0.0 - 0.1 K/uL  Comprehensive metabolic panel     Status: Abnormal   Collection Time: 07/21/14  8:38 PM  Result Value Ref Range   Sodium 138 135 - 145 mmol/L   Potassium 4.0 3.5 - 5.1 mmol/L   Chloride 95 (L) 96 - 112 mmol/L   CO2 32 19 - 32 mmol/L   Glucose, Bld 106 (H) 70 - 99 mg/dL   BUN 42 (H) 6 - 23 mg/dL   Creatinine,  Ser 1.31 0.50 - 1.35 mg/dL   Calcium 9.3 8.4 - 10.5 mg/dL   Total Protein 7.8 6.0 - 8.3 g/dL   Albumin 3.3 (L) 3.5 - 5.2 g/dL   AST 51 (H) 0 - 37 U/L   ALT 30 0 - 53 U/L   Alkaline Phosphatase 78 39 - 117 U/L   Total Bilirubin 2.0 (H) 0.3 - 1.2 mg/dL   GFR calc non Af Amer 56 (L) >90 mL/min   GFR calc Af Amer 65 (L) >90 mL/min    Comment: (NOTE) The eGFR has been calculated using the CKD EPI equation. This calculation has not been validated in all clinical situations. eGFR's persistently <90 mL/min signify possible Chronic Kidney Disease.    Anion gap 11 5 - 15  Brain natriuretic peptide     Status: Abnormal   Collection Time: 07/21/14  8:38 PM  Result Value Ref Range   B Natriuretic Peptide 1685.5 (H) 0.0 - 100.0 pg/mL  Protime-INR     Status: Abnormal   Collection Time: 07/21/14  9:06 PM  Result Value Ref Range   Prothrombin Time 16.8 (H) 11.6 - 15.2 seconds   INR 1.35 0.00 - 1.49  Troponin I     Status: Abnormal   Collection Time: 07/21/14  9:06 PM  Result Value Ref Range    Troponin I 0.21 (H) <0.031 ng/mL    Comment:        PERSISTENTLY INCREASED TROPONIN VALUES IN THE RANGE OF 0.04-0.49 ng/mL CAN BE SEEN IN:       -UNSTABLE ANGINA       -CONGESTIVE HEART FAILURE       -MYOCARDITIS       -CHEST TRAUMA       -ARRYHTHMIAS       -LATE PRESENTING MYOCARDIAL INFARCTION       -COPD   CLINICAL FOLLOW-UP RECOMMENDED.   Ammonia     Status: None   Collection Time: 07/21/14  9:26 PM  Result Value Ref Range   Ammonia 21 11 - 32 umol/L  I-Stat Arterial Blood Gas, ED - (order at [facility] and [facility] only)     Status: Abnormal   Collection Time: 07/21/14  9:32 PM  Result Value Ref Range   pH, Arterial 7.312 (L) 7.350 - 7.450   pCO2 arterial 66.6 (HH) 35.0 - 45.0 mmHg   pO2, Arterial 98.0 80.0 - 100.0 mmHg   Bicarbonate 33.7 (H) 20.0 - 24.0 mEq/L   TCO2 36 0 - 100 mmol/L   O2 Saturation 97.0 %   Acid-Base Excess 4.0 (H) 0.0 - 2.0 mmol/L   Patient temperature 98.6 F    Collection site RADIAL, ALLEN'S TEST ACCEPTABLE    Drawn by RT    Sample type ARTERIAL    Comment NOTIFIED PHYSICIAN     Dg Chest Port 1 View  07/21/2014   CLINICAL DATA:  64 year old male with respiratory distress and shortness of breath for 1 day. Initial encounter.  EXAM: PORTABLE CHEST - 1 VIEW  COMPARISON:  08/19/2007.  FINDINGS: Portable AP view at 2043 hrs. New patchy in veiling opacity at both lung bases. Increased pulmonary vascularity. No pneumothorax. No definite consolidation. Calcified atherosclerosis of the aorta. Normal cardiac size and mediastinal contours. Visualized tracheal air column is within normal limits.  IMPRESSION: Increased pulmonary vascularity with patchy and veiling opacity at both lung bases.  Top differential considerations include pulmonary interstitial edema with pleural effusions and bibasilar pneumonia.   Electronically Signed   By: Lars Pinks  M.D.   On: 07/21/2014 21:26    Review of Systems  Constitutional: Positive for malaise/fatigue. Negative for fever and chills.    HENT: Positive for hearing loss. Negative for ear discharge and ear pain.   Eyes: Negative for photophobia.  Respiratory: Positive for cough and shortness of breath.   Cardiovascular: Positive for orthopnea and leg swelling. Negative for chest pain.  Gastrointestinal: Negative for nausea, vomiting and melena.  Genitourinary: Negative for dysuria and hematuria.  Musculoskeletal: Negative for neck pain.  Skin: Negative for rash.  Neurological: Negative for tingling and tremors.  Endo/Heme/Allergies: Negative for polydipsia.  Psychiatric/Behavioral: Negative for hallucinations. The patient is not nervous/anxious.    Blood pressure 161/93, pulse 106, temperature 97.6 F (36.4 C), temperature source Oral, resp. rate 14, height 6' (1.829 m), weight 81.647 kg (180 lb), SpO2 100 %. Physical Exam  Nursing note and vitals reviewed. Constitutional: He is oriented to person, place, and time. He appears distressed.  Appears older than stated age; accompanied by his brother  HENT:  Nose: Nose normal.  Mouth/Throat: Oropharynx is clear and moist. No oropharyngeal exudate.  Eyes: Conjunctivae and EOM are normal. Pupils are equal, round, and reactive to light. No scleral icterus.  Neck: Normal range of motion. Neck supple. JVD present. No tracheal deviation present.  Cardiovascular: Regular rhythm, normal heart sounds and intact distal pulses.  Exam reveals no gallop.   No murmur heard. tachycardic  Respiratory: He is in respiratory distress. He has no wheezes. He has rales.  GI: Soft. Bowel sounds are normal. He exhibits distension. There is no tenderness. There is no rebound.  Musculoskeletal: Normal range of motion. He exhibits edema. He exhibits no tenderness.  Neurological: He is alert and oriented to person, place, and time. No cranial nerve deficit.  Skin: Skin is dry. No rash noted. He is not diaphoretic. No erythema.  lukewarm  Psychiatric: He has a normal mood and affect. His behavior is  normal.  labs reviewed; wbc 8.5, plt 127, h/h 17/50.6, na 138, K 4.0, bun/cr 42/1.3, glucose 106, Trop I 0.21, INR 1.35, ast/alt 51/30, albumin 3.3, total bili 2.0, alk phos 78, total protein 7.8 abg 7.31/67/98 ECG: sinus tachycardia, PVCs  Assessment/Plan: Problem List Dyspnea/Shortness of Breath Elevated BNP Acute heart failure Tobacco abuse Alcohol abuse Elevated Creatinine - renal insufficiency  Elevated hemoglobin/hematocrit Hypoxia and Hypoxemia  Elevated AST/ALT Elevated INR ? COPD Exacerbation vs PNA [name] [name] is a 64 yo man with PMH of alcohol abuse, tobacco abuse and family history of CAD who presents with several weeks to a few months of dyspnea found to have mildly elevated troponin, diffuse edema/anasarca with elevated BNP and other findings of hepatic injury or congestion.  Differential diagnosis includes pneumonia, COPD exacerbation, acute bronchitis, deconditioning, anginal equivalent, heart failure - systolic and/or diastolic among other etiologies and liver disease. I favor a diagnosis of heart failure with hepatic congestion or liver disease. We will obtain an echocardiogram in AM. We've already started IV diuresis and use of CPAP. Reasonable to continue treatment for PNA and use of BIPAP. Ok to continue heparin for now but if troponins flat and Echo without WMA would stop and presume mildly elevated troponin 2/2 heart failure.  - stepdown, trend cardiac biomarkers - echocardiogram in AM - lasix 40 mg bid - smoking cessation daily  - daily aspirin 81 mg - continue BIPAP for CO2 retention and COPD    [name] [name] 07/22/2014, 2:18 AM

## 2014-07-22 NOTE — Progress Notes (Signed)
UR Completed.  [phone]  

## 2014-07-22 NOTE — Progress Notes (Signed)
Family Medicine Teaching Service Daily Progress Note Intern Pager: [phone]  Patient name: [name] [name] Medical record number: [id number] Date of birth: February 14, 1951 Age: 64 y.o. Gender: male  Primary Care Provider: No primary care provider on file. Consultants: Cards Code Status: Full  Pt Overview and Major Events to Date:  1/25 - admitted for dyspnea, anasarca, elevated BNP; presumed acute CHF.  Hypoxic, placed on BiPap 1/26 - echo performed  Assessment and Plan: [name] [name] is a 64 y.o. male presenting with edema and dyspnea. No past medical history except tobacco and alcohol abuse.  Pulm:   Pt placed on Bipap overnight for sats in 70's.  Satting well this AM; trial removal from bipap resulted in quick decompensation, satting in 80's, bipap replaced.  Pt transferred to CCU,  GCS 5, intubated. -Pulmonary edema- seen on CXR likely related to new onset CHF, confounding factor possible pneumonia -Likely COPD - compensated CO2 retention with elevated bicarb; CO2 retention worsening this AM -Concern for PE - Wells score 3.0; patient in moderate risk group-- CTA negative for PE but showed bilateral pleural effusions -Tachypnea and hypoxia - elevated bicarb (34) on ABG, pCO2 worsening 66.6>71.3 today -Given worsening CO2 retention, GCS 5, will require transfer to CCU and intubation *NOTE* Pt became combative in cardiac unit prior to transfer order, likely 2/2 EtOH withdrawal, dyspnea; required emergent intubation, ativan, transfer to CCU -continue Lasix as below  CV: -New-onset CHF: BNP 1685 - possibly ischemic given extensive smoking history, healthcare naivety vs non-ischemic given extensive alcohol abuse -HTN SBP 138-160s/80s-100s overnight -Tachycardia -Abnormal ECG - Sinus tachycardia with Premature supraventricular complexes and with occasional Premature ventricular complexes, Inferior infarct (age undetermined), Anterolateral infarct (age undetermined).  Repeat EKG obtained today, again  with significant artifact; will repeat once pt stabilized, heparin therapy for presumed Afib in the interim. 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  -No leukocytosis or fevers.  -Patient initially given azithro/rocephin in ED however concern for worsening erythema so discontinued -Abx switched to IV Levaquin -benadryl prn for itching and rash -blood cultures pending  Hepatorenal: Abdominal distention concerning for ascites - liver disease suspected given history of alcohol abuse Enlarged liver - mildly elevated AST(51)  Elevated BUN/Cr - 42/1.3 - AKI -trend CMET -avoid nephrotoxic agents -consider abdominal imaging if patient develops abdominal pain   Social: -Known alcoholic(25 beers a day) and heavy smoker (1ppd for >37yrs) -Lives alone with reported weakness -CIWA protocol -PT/OT eval - signed off for worsening clinical status -nicotine patch -encourage smoking cessation  FEN/GI: NPO/ SLIV Prophylaxis: Heparin Subq  Disposition: Transfer to CCU  Subjective:  Pt sleeping heavily this AM, unable to wake, but responds to pain.  Nurse reports he was removed from Bipap this morning, decompensated quickly, became very irritable with physical outbursts, and was given a dose of Ativan.  She reports he alternates quickly between periods of wakefulness and deep sleeps.  AM ABG being drawn while provider in the room.  Objective: Temp:  [97 F  (36.1 C)-98.7 F (37.1 C)] 97 F (36.1 C) (01/26  0749) Pulse Rate:  [95-112] 103 (01/26 0749) Resp:  [14-28] 20 (01/26 0749) BP: (138-161)/(57-103) 142/77 mmHg (01/26 0749) SpO2:  [93 %-100 %] 93 % (01/26 0749) Weight:  [81.647 kg (180 lb)-92.4 kg (203 lb 11.3 oz)] 92.4 kg (203 lb 11.3 oz) (01/26 0500) Physical Exam: General: GCS 5; nonverbal, eyes closed, localizes pain.   Cardiovascular: distant heart sounds, irregular,  Auscultation complicated by upper airway noise. Respiratory: Turbulent upper airway sounds throughout on bipap, no crackles, rhonchi appreciated  Abdomen: nl BS, distended, fluid wave present Extremities: anasarcous throughout to abdominal level.  2+ pulses BLE.    Laboratory:  Recent Labs Lab 07/21/14 2038 07/22/14 0530  WBC 8.5 8.3  HGB 17.2* 15.1  HCT 50.6 46.2  PLT 127* 110*    Recent Labs Lab 07/21/14 2038 07/22/14 0530  NA 138 138  K 4.0 3.9  CL 95* 97  CO2 32 28  BUN 42* 40*  CREATININE 1.31 1.14  CALCIUM 9.3 8.5  PROT 7.8  --   BILITOT 2.0*  --   ALKPHOS 78  --   ALT 30  --   AST 51*  --   GLUCOSE 106* 86   Results for [name] [name], [name] [name] (MRN [id number]) as of 07/22/2014 12:25  Ref. Range 07/21/2014 21:06 07/22/2014 05:30 07/22/2014 10:25  Troponin I Latest Range: <0.031 ng/mL 0.21 (H) 0.21 (H) 0.24 (H)  Results for [name] [name], [name] [name] (MRN [id number]) as of 07/22/2014 12:25  Ref. Range 07/21/2014 21:32 07/22/2014 09:50  Sample type No range found ARTERIAL ARTERIAL DRAW  Delivery systems No range found  BILEVEL POSITIVE .Marland Kitchen.Marland Kitchen.  FIO2 Latest Units: %  0.50  Mode No range found  BILEVEL POSITIVE .Marland Kitchen.Marland Kitchen.  Inspiratory PAP No range found  14  Expiratory PAP No range found  7  pH, Arterial Latest Range: 7.350-7.450  7.312 (L) 7.296 (L)  pCO2 arterial Latest Range: 35.0-45.0 mmHg 66.6 (HH) 71.3 (HH)  pO2, Arterial Latest Range: 80.0-100.0 mmHg 98.0 82.9  Bicarbonate Latest Range: 20.0-24.0 mEq/L 33.7 (H) 33.7 (H)  TCO2 Latest Range: 0-100 mmol/L 36 35.9  Acid-Base  Excess Latest Range: 0.0-2.0 mmol/L 4.0 (H) 7.4 (H)  O2 Saturation Latest Units: % 97.0 95.2  Patient temperature No range found 98.6 F 98.6  Collection site No range found RADIAL, ALLEN'S T... RIGHT RADIAL   Results for [name] [name], [name] [name] (MRN [id number]) as of 07/22/2014 12:25  Ref. Range 07/22/2014 05:30  Cholesterol Latest Range: 0-200 mg/dL 97  Triglycerides Latest Range: <150 mg/dL 83  HDL Latest Range: >63>39 mg/dL 12 (L)  LDL (calc) Latest Range: 0-99 mg/dL 68  VLDL Latest Range: 0-40 mg/dL 17  Total CHOL/HDL Ratio Latest Units: RATIO 8.1    Imaging/Diagnostic Tests: CXR 07/22/14:  IMPRESSION: No pneumothorax following central line placement. Probable CHF with small RIGHT pleural effusion.  CTA 07/22/14: IMPRESSION: No evidence of significant pulmonary embolus. Cardiac enlargement with passive congestion of the liver. Bilateral pleural effusions and basilar atelectasis or consolidation, greater on the right. Findings suggest congestive heart failure. Diffuse abdominal ascites.  Thomasenia SalesMary A Logsdon, Med Student 07/22/2014, 8:30 AM MS4, [facility] FPTS Intern pager: [phone], text pages welcome  RESIDENT ADDENDUM I have separately seen and examined the patient. I have discussed the findings and exam with the medical student and agree with the above note. Additionally I have outlined my exam and assessment/plan below:  PE: General: bipap in place, not easily aroused HEENT:  CV: regular, distance heart sounds, no murmur appreciated Resp: crackles at the bases Abd: obese/distended, +  ascites fluid wave, nontender, presacral edema Extremities: 2+ pitting edema bilaterally throughout LE Neuro: obtunded, responds to sternal rub with arm movement only, no spontaneous eye opening Skin: erythematous L foot  A/P: [name] [name] is a 64 y.o. male admitted for new onset acute CHF exacerbation, probable COPD with pneumonia, alcohol abuse.  Pt noted to be combative this AM and  received  ativan, trial of [place] but failed and went back on bipap, antibiotics switched overnight due to concern of allergic reaction. Concern for worsening hypercarbia on bipap with repeat ABG and severe alcohol withdrawals, CCM called and pt now intubated/sedated on their service. Appreciate their assistance in the management of this complex patient. We will continue to follow patient until they are stable to come back to our service.  Tawni Carnes, MD 07/22/2014, 2:26 PM PGY-2, [facility] Family Medicine FPTS Intern Pager: [phone], text pages welcome

## 2014-07-22 NOTE — Procedures (Signed)
Central Venous Catheter Insertion Procedure Note Leonidas RombergClyde Sadowski [id number] 1951-03-29  Procedure: Insertion of Central Venous Catheter Indications: Assessment of intravascular volume, Drug and/or fluid administration and Frequent blood sampling  Procedure Details Consent: Risks of procedure as well as the alternatives and risks of each were explained to the (patient/caregiver).  Consent for procedure obtained. Time Out: Verified patient identification, verified procedure, site/side was marked, verified correct patient position, special equipment/implants available, medications/allergies/relevent history reviewed, required imaging and test results available.  Performed  Maximum sterile technique was used including antiseptics, cap, gloves, gown, hand hygiene, mask and sheet. Skin prep: Chlorhexidine; local anesthetic administered A antimicrobial bonded/coated triple lumen catheter was placed in the left internal jugular vein using the Seldinger technique. Ultrasound guidance used.Yes.   Catheter placed to 20 cm. Blood aspirated via all 3 ports and then flushed x 3. Line sutured x 2 and dressing applied.  Evaluation Blood flow good Complications: No apparent complications Patient did tolerate procedure well. Chest X-ray ordered to verify placement.  CXR: pending.  Brett CanalesSteve Minor ACNP [facility] Pager [phone] till 3 pm If no answer page [phone] 07/22/2014, 11:40 AM   U/S used in placement.  I was present and supervised the entire procedure.  Alyson ReedyWesam G. Yacoub, M.D. [facility] Medicine. Pager: [phone]. After hours pager: [phone].

## 2014-07-22 NOTE — Procedures (Signed)
Intubation Procedure Note [name] RombergClyde [name] [id number] 1951-02-27  Procedure: Intubation Indications: Respiratory insufficiency  Procedure Details Consent: Risks of procedure as well as the alternatives and risks of each were explained to the (patient/caregiver).  Consent for procedure obtained. Time Out: Verified patient identification, verified procedure, site/side was marked, verified correct patient position, special equipment/implants available, medications/allergies/relevent history reviewed, required imaging and test results available.  Performed  MAC and 4 Medications:  Fentanyl  Etomidate Versed NMB    Evaluation Hemodynamic Status: BP stable throughout; O2 sats: stable throughout Patient's Current Condition: stable Complications: No apparent complications Patient did tolerate procedure well. Chest X-ray ordered to verify placement.  CXR: pending.   Brett CanalesSteve Minor ACNP [facility] Pager [phone] till 3 pm If no answer page [phone] 07/22/2014, 11:41 AM  I was present and supervised the entire procedure.  Alyson ReedyWesam G. Jonatan Wilsey, M.D. [facility]. Pager: [phone]. After hours pager: [phone].

## 2014-07-22 NOTE — Progress Notes (Signed)
Physical Therapy Discharge Patient Details Name: [name] RombergClyde [name] MRN: [id number] DOB: [birth year] Today's Date: 07/22/2014 Time:  -     Patient discharged from PT services secondary to medical decline - will need to re-order PT to resume therapy services.  Please see latest therapy progress note for current level of functioning and progress toward goals.    Progress and discharge plan discussed with patient and/or caregiver: Patient unable to participate in discharge planning and no caregivers available  GP     [facility] HospitalMAYCOCK,[name] 07/22/2014, 1:22 PM  [facility] Khal[facility] Barnick PT [phone]

## 2014-07-22 NOTE — Progress Notes (Signed)
PROGRESS NOTE  Subjective:    64 yo man with hx of heavy ETOH abuse, smoker, family hx of CAD.  Presents with lethargy, swelling, and progressive dyspnea.  Today, he is combative, no hx could be obtained.  Objective:    Vital Signs:   Temp:  [97 F (36.1 C)-98.7 F (37.1 C)] 97 F (36.1 C) (01/26 0749) Pulse Rate:  [63-112] 63 (01/26 0810) Resp:  [14-28] 23 (01/26 0810) BP: (136-161)/(57-103) 136/95 mmHg (01/26 0800) SpO2:  [88 %-100 %] 95 % (01/26 0810) FiO2 (%):  [100 %] 100 % (01/26 0810) Weight:  [180 lb (81.647 kg)-203 lb 11.3 oz (92.4 kg)] 203 lb 11.3 oz (92.4 kg) (01/26 0500)  Last BM Date: 07/21/14   24-hour weight change: Weight change:   Weight trends: Filed Weights   07/21/14 2034 07/22/14 0500  Weight: 180 lb (81.647 kg) 203 lb 11.3 oz (92.4 kg)    Intake/Output:  01/25 0701 - 01/26 0700 In: 150 [IV Piggyback:150] Out: 450 [Urine:450] Total I/O In: 135 [P.O.:120; I.V.:15] Out: 375 [Urine:375]   Physical Exam: BP 136/95 mmHg  Pulse 63  Temp(Src) 97 F (36.1 C) (Axillary)  Resp 23  Ht 6' (1.829 m)  Wt 203 lb 11.3 oz (92.4 kg)  BMI 27.62 kg/m2  SpO2 95%  Wt Readings from Last 3 Encounters:  07/22/14 203 lb 11.3 oz (92.4 kg)    General: Vital signs reviewed and noted. Chronically ill appearing man  Head: Normocephalic, atraumatic.  BIPAP mask is on  Eyes: Closed   Throat: normal  Neck:  thin  Lungs:    BIPAP   Heart:  RR,   Abdomen:  Soft, non-tender, non-distended    Extremities: 1-2 + leg edema,  All extremities are very red.  Appears to have chronic changes.    Neurologic: Combative, moves all extremities.  Uncooperative   Psych: Unable to assess     Labs: BMET:  Recent Labs  07/21/14 2038 07/22/14 0530  NA 138 138  K 4.0 3.9  CL 95* 97  CO2 32 28  GLUCOSE 106* 86  BUN 42* 40*  CREATININE 1.31 1.14  CALCIUM 9.3 8.5    Liver function tests:  Recent Labs  07/21/14 2038  AST 51*  ALT 30  ALKPHOS 78    BILITOT 2.0*  PROT 7.8  ALBUMIN 3.3*   No results for input(s): LIPASE, AMYLASE in the last 72 hours.  CBC:  Recent Labs  07/21/14 2038 07/22/14 0530  WBC 8.5 8.3  NEUTROABS 6.7  --   HGB 17.2* 15.1  HCT 50.6 46.2  MCV 107.7* 109.0*  PLT 127* 110*    Cardiac Enzymes:  Recent Labs  07/21/14 2106 07/22/14 0530  TROPONINI 0.21* 0.21*    Coagulation Studies:  Recent Labs  07/21/14 2106  LABPROT 16.8*  INR 1.35    Other: Invalid input(s): POCBNP No results for input(s): DDIMER in the last 72 hours. No results for input(s): HGBA1C in the last 72 hours.  Recent Labs  07/22/14 0530  CHOL 97  HDL 12*  LDLCALC 68  TRIG 83  CHOLHDL 8.1    Recent Labs  07/22/14 0530  TSH 2.175   No results for input(s): VITAMINB12, FOLATE, FERRITIN, TIBC, IRON, RETICCTPCT in the last 72 hours.   Other results:  EKG :  ? Sinus tach with PACs,  Vs. Afib.   Lots of artifact.   Will repeat ECG once he is more stable.   Medications:  Infusions:    Scheduled Medications: . fentaNYL      . furosemide  40 mg Intravenous BID  . heparin  5,000 Units Subcutaneous 3 times per day  . levofloxacin (LEVAQUIN) IV  750 mg Intravenous Q24H  . midazolam      . sodium chloride  3 mL Intravenous Q12H  . sodium chloride  3 mL Intravenous Q12H    Assessment/ Plan:    1. Chronic systolic CHf:  The patient has a long hx of heavy ETOH drinking ( 25 beers a day) he has moderate LV and RV dysfunction.    Will start CHF medications as tolerated.   2. 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  Will need to watch for DTs.      Disposition:  Length of Stay: 1  Vesta MixerPhilip J. Nahser, Montez HagemanJr., MD, [facility] 07/22/2014, 11:02 AM Office [phone] Pager [phone]

## 2014-07-22 NOTE — Progress Notes (Signed)
OT Cancellation Note  Patient Details Name: [name] RombergClyde [name] MRN: [id number] DOB: Jul 11, 1950   Cancelled Treatment:    Reason Eval/Treat Not Completed: Medical issues which prohibited therapy (Pt intubated this morning.  Will follow.)  Evern BioMayberry, Audre Cenci Lynn 07/22/2014, 12:30 PM

## 2014-07-22 NOTE — Consult Note (Signed)
PULMONARY / CRITICAL CARE MEDICINE   Name: [name] [name] MRN: [id number] DOB: [birth year]    ADMISSION DATE:  07/21/2014 CONSULTATION DATE:  07/22/14  REFERRING MD :  Dr. Randolm Idol / [facility]PTS  CHIEF COMPLAINT: Acute Respiratory Failure   INITIAL PRESENTATION: 64 y/o M admitted 1/25 with c/o SOB, swelling to LE's up to abdomen.  Work up consistent with decompensated CHF, +/- CAP.  Pt decompensated with hypercarbic respiratory failure on 1/26, PCCM consulted.    STUDIES:  1/25  CXR >> increased pulmonary vascularity, opacities in both lung bases  1/26  CTA Chest >> neg for PE, cardiac enlargement, passive congestion of liver, bilateral pleural effusions, basilar atx  SIGNIFICANT EVENTS: 1/25  Admit for decompensated CHF   HISTORY OF PRESENT ILLNESS:   64 y/o M, heavy smoker & ETOH abuse (reported 25 beers per day) with a PMH of anxiety, GERD, spinal surgery who presented to [facility] on 1/25 with a 1-2 month history of worsening shortness of breath, difficulty lying flat, swelling in LE's up to abdomen.  On day of admission, he was found at home in the floor by a friend.  The patient had reportedly slipped out of his recliner in to the floor.  Per report, he has not seen an MD in > 7 years.    On arrival to the the ER, he was noted to be hypoxic with saturations into the 70's on room air.  Initial cxr was concerning for pulmonary edema and possible CAP.  The patient was admitted by FPTS, treated with IV lasix, abx, BiPAP and pan cultured. He was placed on the CIWA protocol.  The patient developed worsening respiratory distress and inability to come off bipap for any extended period of time. ABG assessed  - 7.296 / 71 / 82 / 33.  Of note, he developed generalized erythema post levaquin administration.  PCCM consulted for hypercarbic respiratory failure.    PAST MEDICAL HISTORY :   has a past medical history of Heavy smoker; CHF (congestive heart failure); Anxiety; Shortness of breath dyspnea;  and GERD (gastroesophageal reflux disease).  has past surgical history that includes Spine surgery and Leg Surgery.    HOME MEDICATIONS:  Prior to Admission medications   Medication Sig Start Date End Date Taking? Authorizing Provider  naproxen sodium (ANAPROX) 220 MG tablet Take 440 mg by mouth 2 (two) times daily as needed (pain).   Yes Historical Provider, MD   No Known Allergies  FAMILY HISTORY:  has no family status information on file.    SOCIAL HISTORY:  reports that he has been smoking Cigarettes.  He does not have any smokeless tobacco history on file. He reports that he drinks about 105.0 oz of alcohol per week. He reports that he does not use illicit drugs.  REVIEW OF SYSTEMS:  Unable to complete as patient is altered.   SUBJECTIVE:   VITAL SIGNS: Temp:  [97 F (36.1 C)-98.7 F (37.1 C)] 97 F (36.1 C) (01/26 0749) Pulse Rate:  [63-112] 63 (01/26 0810) Resp:  [14-28] 23 (01/26 0810) BP: (136-161)/(57-103) 136/95 mmHg (01/26 0800) SpO2:  [88 %-100 %] 95 % (01/26 0810) FiO2 (%):  [100 %] 100 % (01/26 0810) Weight:  [180 lb (81.647 kg)-203 lb 11.3 oz (92.4 kg)] 203 lb 11.3 oz (92.4 kg) (01/26 0500)   HEMODYNAMICS:     VENTILATOR SETTINGS: Vent Mode:  [-]  FiO2 (%):  [100 %] 100 %   INTAKE / OUTPUT:  Intake/Output Summary (Last 24 hours)  at 07/22/14 1107 Last data filed at 07/22/14 0800  Gross per 24 hour  Intake    285 ml  Output    825 ml  Net   -540 ml    PHYSICAL EXAMINATION: General:  Chronically ill adult male, lethargic  Neuro:  Agitated to voice, MAE but does not follow commands HEENT:  BiPAP in place, mm pink/dry Cardiovascular:  s1s2 rrr, no m/r/g Lungs:  resp's shallow, non-labored, diminished bilaterally  Abdomen:  Obese/soft, bsx4 active  Musculoskeletal:  No acute deformities  Skin:  Warm/dry, generalized erythema, 1-2+ generalized pitting edema.  Chronic LE changes  LABS:  CBC  Recent Labs Lab 07/21/14 2038 07/22/14 0530  WBC  8.5 8.3  HGB 17.2* 15.1  HCT 50.6 46.2  PLT 127* 110*   Coag's  Recent Labs Lab 07/21/14 2106  INR 1.35   BMET  Recent Labs Lab 07/21/14 2038 07/22/14 0530  NA 138 138  K 4.0 3.9  CL 95* 97  CO2 32 28  BUN 42* 40*  CREATININE 1.31 1.14  GLUCOSE 106* 86   Electrolytes  Recent Labs Lab 07/21/14 2038 07/22/14 0530  CALCIUM 9.3 8.5   Sepsis Markers No results for input(s): LATICACIDVEN, PROCALCITON, O2SATVEN in the last 168 hours.   ABG  Recent Labs Lab 07/21/14 2132 07/22/14 0950  PHART 7.312* 7.296*  PCO2ART 66.6* 71.3*  PO2ART 98.0 82.9   Liver Enzymes  Recent Labs Lab 07/21/14 2038  AST 51*  ALT 30  ALKPHOS 78  BILITOT 2.0*  ALBUMIN 3.3*   Cardiac Enzymes  Recent Labs Lab 07/21/14 2106 07/22/14 0530  TROPONINI 0.21* 0.21*   Glucose No results for input(s): GLUCAP in the last 168 hours.  Imaging Dg Chest Port 1 View  07/21/2014   CLINICAL DATA:  64 year old male with respiratory distress and shortness of breath for 1 day. Initial encounter.  EXAM: PORTABLE CHEST - 1 VIEW  COMPARISON:  08/19/2007.  FINDINGS: Portable AP view at 2043 hrs. New patchy in veiling opacity at both lung bases. Increased pulmonary vascularity. No pneumothorax. No definite consolidation. Calcified atherosclerosis of the aorta. Normal cardiac size and mediastinal contours. Visualized tracheal air column is within normal limits.  IMPRESSION: Increased pulmonary vascularity with patchy and veiling opacity at both lung bases.  Top differential considerations include pulmonary interstitial edema with pleural effusions and bibasilar pneumonia.   Electronically Signed   By: Augusto Gamble M.D.   On: 07/21/2014 21:26     ASSESSMENT / PLAN:  PULMONARY OETT 1/26 >> A: Acute Hypercarbic / Hypoxemic Respiratory Failure - ddx includes CHF, PNA (PE ruled OUT) Bilateral Pleural Effusions R/O CAP  Tobacco Abuse Presumed COPD  P:   Now intubation, PRVC 8cc/kg, R 18 Wean PEEP/FiO2  for sats 88-94% ABG in one hour PCXR post intubation for line / ETT placement  Duoneb + Q3 PRN albuterol given smoking hx  See ID   CARDIOVASCULAR CVL L IJ 1/26 >>  A:  Chronic Systolic CHF - mod LV/RV dysfunction on ECHO Alcoholic Cardiomyopathy  P:  Cardiology consulted  Diuresis as renal function / BP permit  EKG now ECHO reviewed with Dr. Melburn Popper at bedside Assess troponin  Otherwise per cards  RENAL A:   No acute issues P:   Trend BMP Lasix 40 mg BID Replace electrolytes as indicated Diuresis   GASTROINTESTINAL A:   Protein Calorie Malnutrition - in the setting of ETOH abuse  P:   SUP:  Pepcid  NPO OGT Consider TF in am 1/27  if remains intubated   HEMATOLOGIC A:   Macrocytic Anemia - in setting of ETOH abuse  Thrombocytopenia  P:  Monitor CBC DVT: Heparin  Monitor platelets closely  INFECTIOUS A:   R/O CAP - bibasilar airspace disease on CT (likely atx), effusions P:   BCx2 1/25 >>  Sputum 1/26 >>   Rocephin, start date 1/26, day 1/x Azithro, start date 1/26, day 1/x  D2 abx levaquin was discontinued due to an allergic reaction Trend fever curve / WBC  ENDOCRINE A:    At Risk Hypoglycemia - in setting of presumed liver disease / NPO status P:   D5NS @ 40 ml/hr CBG Q6   NEUROLOGIC A:   Acute Metabolic Encephalopathy - in setting of ETOH abuse / withdrawal  ETOH Abuse  P:   RASS goal: -1 Precedex gtt Monitor for withdrawal sx PRN ativan / fentanyl   FAMILY  - Updates:  No family available at time of rounds  - Inter-disciplinary family meet or Palliative Care meeting due by:  2/2  Canary BrimBrandi Ollis, NP-C [facility] Pgr: [phone] or [phone]  Above note edited as necessary.  Patient will be intubated, sedated as above.  Diureses ordered.  The primary reason for respiratory failure is agitation and withdrawal from etoh.  Abx will be adjusted.  On exam there are evidence of bilateral pleural effusion but I am not  convinced this is due to PNA (likely CHF) as the patient is not behaving toxic.  Abdominal exam is benign so doubt SBP.  Will continue treatment for CAP however and low threshold to D/C if cultures are unimpressive.  The patient is critically ill with multiple organ systems failure and requires high complexity decision making for assessment and support, frequent evaluation and titration of therapies, application of advanced monitoring technologies and extensive interpretation of multiple databases.   Critical Care Time devoted to patient care services described in this note is  45  Minutes. This time reflects time of care of this signee Dr Koren BoundWesam Yacoub. This critical care time does not reflect procedure time, or teaching time or supervisory time of PA/NP/Med student/Med Resident etc but could involve care discussion time.  Alyson ReedyWesam G. Yacoub, M.D. [facility]. Pager: [phone]. After hours pager: [phone].   07/22/2014, 11:07 AM

## 2014-07-23 ENCOUNTER — Inpatient Hospital Stay (HOSPITAL_COMMUNITY): Payer: Self-pay

## 2014-07-23 LAB — BASIC METABOLIC PANEL
ANION GAP: 7 (ref 5–15)
Anion gap: 10 (ref 5–15)
Anion gap: 12 (ref 5–15)
BUN: 24 mg/dL — AB (ref 6–23)
BUN: 25 mg/dL — ABNORMAL HIGH (ref 6–23)
BUN: 23 mg/dL (ref 6–23)
CALCIUM: 8.9 mg/dL (ref 8.4–10.5)
CALCIUM: 8.9 mg/dL (ref 8.4–10.5)
CO2: 37 mmol/L — AB (ref 19–32)
CO2: 38 mmol/L — ABNORMAL HIGH (ref 19–32)
CO2: 43 mmol/L (ref 19–32)
Calcium: 8.4 mg/dL (ref 8.4–10.5)
Chloride: 95 mmol/L — ABNORMAL LOW (ref 96–112)
Chloride: 90 mmol/L — ABNORMAL LOW (ref 96–112)
Chloride: 90 mmol/L — ABNORMAL LOW (ref 96–112)
Creatinine, Ser: 1.25 mg/dL (ref 0.50–1.35)
Creatinine, Ser: 1.3 mg/dL (ref 0.50–1.35)
Creatinine, Ser: 1.17 mg/dL (ref 0.50–1.35)
GFR calc Af Amer: 69 mL/min — ABNORMAL LOW (ref >90)
GFR calc Af Amer: 65 mL/min — ABNORMAL LOW (ref >90)
GFR calc non Af Amer: 59 mL/min — ABNORMAL LOW (ref >90)
GFR calc non Af Amer: 56 mL/min — ABNORMAL LOW (ref >90)
GFR, EST AFRICAN AMERICAN: 74 mL/min — AB (ref >90)
GFR, EST NON AFRICAN AMERICAN: 64 mL/min — AB (ref >90)
GLUCOSE: 211 mg/dL — AB (ref 70–99)
GLUCOSE: 108 mg/dL — AB (ref 70–99)
GLUCOSE: 106 mg/dL — AB (ref 70–99)
POTASSIUM: 2.7 mmol/L — AB (ref 3.5–5.1)
POTASSIUM: 2.6 mmol/L — AB (ref 3.5–5.1)
Potassium: 2.9 mmol/L — ABNORMAL LOW (ref 3.5–5.1)
SODIUM: 140 mmol/L (ref 135–145)
Sodium: 142 mmol/L (ref 135–145)
Sodium: 140 mmol/L (ref 135–145)

## 2014-07-23 LAB — CBC
HCT: 48.2 % (ref 39.0–52.0)
Hemoglobin: 16.1 g/dL (ref 13.0–17.0)
MCH: 34.5 pg — ABNORMAL HIGH (ref 26.0–34.0)
MCHC: 33.4 g/dL (ref 30.0–36.0)
MCV: 103.2 fL — ABNORMAL HIGH (ref 78.0–100.0)
Platelets: 88 10*3/uL — ABNORMAL LOW (ref 150–400)
RBC: 4.67 MIL/uL (ref 4.22–5.81)
RDW: 14.5 % (ref 11.5–15.5)
WBC: 6.4 10*3/uL (ref 4.0–10.5)

## 2014-07-23 LAB — MAGNESIUM
MAGNESIUM: 1.8 mg/dL (ref 1.5–2.5)
Magnesium: 1.4 mg/dL — ABNORMAL LOW (ref 1.5–2.5)
Magnesium: 1.8 mg/dL (ref 1.5–2.5)
Magnesium: 2 mg/dL (ref 1.5–2.5)

## 2014-07-23 LAB — COMPREHENSIVE METABOLIC PANEL
ALBUMIN: 2.4 g/dL — AB (ref 3.5–5.2)
ALT: 20 U/L (ref 0–53)
AST: 35 U/L (ref 0–37)
Alkaline Phosphatase: 53 U/L (ref 39–117)
Anion gap: 14 (ref 5–15)
BUN: 28 mg/dL — ABNORMAL HIGH (ref 6–23)
CO2: 34 mmol/L — ABNORMAL HIGH (ref 19–32)
Calcium: 8.7 mg/dL (ref 8.4–10.5)
Chloride: 91 mmol/L — ABNORMAL LOW (ref 96–112)
Creatinine, Ser: 1.34 mg/dL (ref 0.50–1.35)
GFR calc Af Amer: 63 mL/min — ABNORMAL LOW (ref >90)
GFR calc non Af Amer: 54 mL/min — ABNORMAL LOW (ref >90)
GLUCOSE: 106 mg/dL — AB (ref 70–99)
Potassium: 2.5 mmol/L — CL (ref 3.5–5.1)
SODIUM: 139 mmol/L (ref 135–145)
Total Bilirubin: 2.5 mg/dL — ABNORMAL HIGH (ref 0.3–1.2)
Total Protein: 5.5 g/dL — ABNORMAL LOW (ref 6.0–8.3)

## 2014-07-23 LAB — GLUCOSE, CAPILLARY
GLUCOSE-CAPILLARY: 96 mg/dL (ref 70–99)
GLUCOSE-CAPILLARY: 115 mg/dL — AB (ref 70–99)
Glucose-Capillary: 100 mg/dL — ABNORMAL HIGH (ref 70–99)
Glucose-Capillary: 106 mg/dL — ABNORMAL HIGH (ref 70–99)
Glucose-Capillary: 115 mg/dL — ABNORMAL HIGH (ref 70–99)
Glucose-Capillary: 119 mg/dL — ABNORMAL HIGH (ref 70–99)

## 2014-07-23 LAB — PHOSPHORUS
Phosphorus: 2.2 mg/dL — ABNORMAL LOW (ref 2.3–4.6)
Phosphorus: 1.7 mg/dL — ABNORMAL LOW (ref 2.3–4.6)
Phosphorus: 1.8 mg/dL — ABNORMAL LOW (ref 2.3–4.6)
Phosphorus: 4 mg/dL (ref 2.3–4.6)

## 2014-07-23 MED ORDER — VITAL AF 1.2 CAL PO LIQD
1000.0000 mL | ORAL | Status: DC
  Administered 2014-07-23: 1000 mL
  Filled 2014-07-23 (×12): qty 1000

## 2014-07-23 MED ORDER — ASPIRIN 81 MG PO CHEW
81.0000 mg | CHEWABLE_TABLET | Freq: Every day | ORAL | Status: DC
  Administered 2014-07-23 – 2014-08-04 (×13): 81 mg via ORAL
  Filled 2014-07-23 (×12): qty 1

## 2014-07-23 MED ORDER — POTASSIUM CHLORIDE 10 MEQ/50ML IV SOLN
10.0000 meq | INTRAVENOUS | Status: AC
  Administered 2014-07-23 (×6): 10 meq via INTRAVENOUS
  Filled 2014-07-23 (×6): qty 50

## 2014-07-23 MED ORDER — PRO-STAT SUGAR FREE PO LIQD
30.0000 mL | Freq: Two times a day (BID) | ORAL | Status: DC
  Administered 2014-07-23: 30 mL
  Filled 2014-07-23 (×2): qty 30

## 2014-07-23 MED ORDER — VITAL HIGH PROTEIN PO LIQD
1000.0000 mL | ORAL | Status: DC
  Filled 2014-07-23 (×2): qty 1000

## 2014-07-23 MED ORDER — DEXMEDETOMIDINE HCL IN NACL 400 MCG/100ML IV SOLN
0.2000 ug/kg/h | INTRAVENOUS | Status: AC
  Administered 2014-07-23: 0.2 ug/kg/h via INTRAVENOUS
  Administered 2014-07-23: 0.4 ug/kg/h via INTRAVENOUS
  Filled 2014-07-23: qty 100

## 2014-07-23 MED ORDER — POTASSIUM PHOSPHATES 15 MMOLE/5ML IV SOLN
15.0000 mmol | Freq: Once | INTRAVENOUS | Status: AC
  Administered 2014-07-23: 15 mmol via INTRAVENOUS
  Filled 2014-07-23: qty 5

## 2014-07-23 MED ORDER — MAGNESIUM SULFATE 2 GM/50ML IV SOLN
2.0000 g | Freq: Once | INTRAVENOUS | Status: AC
  Administered 2014-07-23: 2 g via INTRAVENOUS
  Filled 2014-07-23: qty 50

## 2014-07-23 MED ORDER — POTASSIUM CHLORIDE 10 MEQ/50ML IV SOLN: 10.0000 meq | INTRAVENOUS | Status: DC

## 2014-07-23 NOTE — Progress Notes (Signed)
RT called to bedside to assist with self-extubation. Pt is currently stable. Pt placed on 50% Venti Mask. Sats 98%. BBS: Clear. Pt does not appear to be in any distress at this time. Vent will remain at bedside. RT will monitor.

## 2014-07-23 NOTE — Progress Notes (Signed)
Increased peep and FiO2 per Dr Kendrick FriesMcquaid due to SPO2 85

## 2014-07-23 NOTE — Progress Notes (Addendum)
[facility] PCCM PROGRESS NOTE  S: [facility] bedside by [facility] to assess patient after self-extubation. RNs report that has has been intermittently agitated and think it is likely that he did this intentionally.   O: BP 133/78 mmHg  Pulse 119  Temp(Src) 97.9 F (36.6 C) (Oral)  Resp 18  Ht 6' (1.829 m)  Wt 86.3 kg (190 lb 4.1 oz)  BMI 25.80 kg/m2  SpO2 91%  General:  Thin male in NAD on 100% NRB Neuro:  Alert to verbal, oriented to self only HEENT:  [place]/AT, PERRL, no JVD noted Cardiovascular:  Irreg irreg, rate controlled. No MRG Lungs:  Bilateral lungs mildly coarse. Abdomen:  Soft, non-tender, non-distended. Musculoskeletal:  ROM intact, no acute deformity Skin:  Intact, MMM   A/P:  Acute Hypercarbic / Hypoxemic Respiratory Failure 2nd to CHF, COPD S/p self-extuabtion   Patient is oxygenating will after self extubation. He is still on low dose of precedex and is alert, following commands, and is not exhibiting any agitation at this time.   - Will allow him to remain extubated for now and assess response - Provide supplemental O2, wean to maintain Spo2 > 88% - Continue precedex at low dose for RASS goal 0 - Good candidate for BiPAP should he become hypoxemic - Monitor   Joneen RoachPaul Terresa Marlett, ACNP [facility] Pager [phone] or [phone]

## 2014-07-23 NOTE — Progress Notes (Signed)
According to SnookMelinda, RN, pt voiced his concern that he wanted the ETT out and go home. Few minutes later, the same nurse noticed that the pt has pulled the ETT out and his O2 sats  was around 85% on RA. RT was called, and E-link was also called. Bag mask applied for a few minutes. Pt was alert and oriented and was breathing on his own, so he was transitioned to a nonrebreating mask then to a venti-mask. pt's current O2 sat is 94% on 50% FiO2 venti-mask.  Report given to the incoming night shift nurse.

## 2014-07-23 NOTE — Progress Notes (Signed)
CRITICAL VALUE ALERT  Critical value received:  Potassium 2.5  Date of notification:  07/23/2014  Time of notification:  0635  Critical value read back:Yes.    Nurse who received alert:  Carolyne FiscalK. Skilar Marcou   MD notified (1st page):  Ahmed  Time of first page:  [phone]  Responding MD:  Tasia CatchingsAhmed  Time MD responded:  [phone]

## 2014-07-23 NOTE — Progress Notes (Signed)
CRITICAL VALUE ALERT  Critical value received:  K=2.6   Date of notification:  07/23/14  Time of notification:  1835  Critical value read back:Yes.    Nurse who received alert:  Juan C.       Responding MD:  Dr. Tasia CatchingsAhmed verbally acknowledged the lab result. K replacement order in place

## 2014-07-23 NOTE — Progress Notes (Signed)
PROGRESS NOTE  Subjective:    64 yo man with hx of heavy ETOH abuse, smoker, family hx of CAD.  Presents with lethargy, swelling, and progressive dyspnea.  He is intubated and sedated.  Objective:    Vital Signs:   Temp:  [97.7 F (36.5 C)-98.8 F (37.1 C)] 97.7 F (36.5 C) (01/27 1306) Pulse Rate:  [31-121] 87 (01/27 1235) Resp:  [11-26] 16 (01/27 1235) BP: (99-148)/(45-92) 99/54 mmHg (01/27 1235) SpO2:  [88 %-98 %] 92 % (01/27 1235) FiO2 (%):  [40 %-50 %] 50 % (01/27 1235) Weight:  [190 lb 4.1 oz (86.3 kg)] 190 lb 4.1 oz (86.3 kg) (01/27 0500)  Last BM Date: 07/21/14   24-hour weight change: Weight change: 20 lb 6.4 oz (9.253 kg)  Weight trends: Filed Weights   07/22/14 0500 07/22/14 1318 07/23/14 0500  Weight: 203 lb 11.3 oz (92.4 kg) 200 lb 6.4 oz (90.9 kg) 190 lb 4.1 oz (86.3 kg)    Intake/Output:  01/26 0701 - 01/27 0700 In: 1783.4 [P.O.:120; I.V.:1123.4; NG/GT:140; IV Piggyback:400] Out: 6805 [Urine:6805] Total I/O In: 496.8 [I.V.:236.8; NG/GT:60; IV Piggyback:200] Out: 1175 [Urine:1175]   Physical Exam: BP 99/54 mmHg  Pulse 87  Temp(Src) 97.7 F (36.5 C) (Oral)  Resp 16  Ht 6' (1.829 m)  Wt 190 lb 4.1 oz (86.3 kg)  BMI 25.80 kg/m2  SpO2 92%  Wt Readings from Last 3 Encounters:  07/23/14 190 lb 4.1 oz (86.3 kg)    General: Vital signs reviewed and noted. Chronically ill appearing man  Head: Normocephalic, atraumatic.  BIPAP mask is on  Eyes: Closed   Throat: ETT in place   Neck:  thin  Lungs:    on the vent    Heart:  RR,   Abdomen:  Soft, non-tender, non-distended    Extremities: 1-2 + leg edema,  All extremities are very red.  Appears to have chronic changes.    Neurologic: Combative, moves all extremities.  Uncooperative   Psych: Unable to assess     Labs: BMET:  Recent Labs  07/22/14 0530 07/23/14 0535 07/23/14 1043  NA 138 139  --   K 3.9 2.5*  --   CL 97 91*  --   CO2 28 34*  --   GLUCOSE 86 106*  --   BUN 40*  28*  --   CREATININE 1.14 1.34  --   CALCIUM 8.5 8.7  --   MG  --  1.4* 1.8  PHOS  --  2.2* 1.7*    Liver function tests:  Recent Labs  07/21/14 2038 07/23/14 0535  AST 51* 35  ALT 30 20  ALKPHOS 78 53  BILITOT 2.0* 2.5*  PROT 7.8 5.5*  ALBUMIN 3.3* 2.4*   No results for input(s): LIPASE, AMYLASE in the last 72 hours.  CBC:  Recent Labs  07/21/14 2038 07/22/14 0530 07/23/14 0535  WBC 8.5 8.3 6.4  NEUTROABS 6.7  --   --   HGB 17.2* 15.1 16.1  HCT 50.6 46.2 48.2  MCV 107.7* 109.0* 103.2*  PLT 127* 110* 88*    Cardiac Enzymes:  Recent Labs  07/21/14 2106 07/22/14 0530 07/22/14 1025 07/22/14 1649  TROPONINI 0.21* 0.21* 0.24* 0.23*    Coagulation Studies:  Recent Labs  07/21/14 2106  LABPROT 16.8*  INR 1.35    Other: Invalid input(s): POCBNP No results for input(s): DDIMER in the last 72 hours.  Recent Labs  07/22/14 0530  HGBA1C 6.1*  Recent Labs  07/22/14 0530  CHOL 97  HDL 12*  LDLCALC 68  TRIG 83  CHOLHDL 8.1    Recent Labs  07/22/14 0530  TSH 2.175   No results for input(s): VITAMINB12, FOLATE, FERRITIN, TIBC, IRON, RETICCTPCT in the last 72 hours.   Other results:  EKG :  ? Sinus tach with PACs,  Vs. Afib.   Lots of artifact.   Will repeat ECG once he is more stable.   Medications:    Infusions: . dextrose 5 % and 0.9% NaCl Stopped (07/23/14 1100)  . feeding supplement (VITAL AF 1.2 CAL)      Scheduled Medications: . antiseptic oral rinse  7 mL Mouth Rinse QID  . aspirin  81 mg Oral Daily  . chlorhexidine  15 mL Mouth Rinse BID  . famotidine  20 mg Per Tube BID  . feeding supplement (PRO-STAT SUGAR FREE 64)  30 mL Per Tube BID  . folic acid  1 mg Oral Daily  . furosemide  40 mg Intravenous BID  . heparin  5,000 Units Subcutaneous 3 times per day  . ipratropium-albuterol  3 mL Nebulization Q4H  . multivitamin with minerals  1 tablet Oral Daily  . potassium phosphate IVPB (mmol)  15 mmol Intravenous Once  .  sodium chloride  3 mL Intravenous Q12H  . thiamine  100 mg Oral Daily    Assessment/ Plan:    1. Chronic systolic CHf:  The patient has a long hx of heavy ETOH drinking ( 25 beers a day) he has moderate LV and RV dysfunction.    LV EF is 30-35% , mild MR , moderate RV dysfunction.   Also has hx of heavy smoking.  Still wheezing.  Probably would not tolerate coreg or any beta blocker at this point.   Will hopefully be able to start BB and ARB soon.   2. 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  Will need to watch for DTs.      Disposition:  Length of Stay: 2  Alvia GrovePhilip J. Wilder Amodei, Jr., MD, [facility] 07/23/2014, 1:26 PM Office [phone] Pager [phone]

## 2014-07-23 NOTE — Progress Notes (Signed)
INITIAL NUTRITION ASSESSMENT  DOCUMENTATION CODES Per approved criteria    -Not Applicable   INTERVENTION:  Initiate TF via OGT with Vital AF 1.2 at 25 ml/h and Prostat 30 ml BID on day 1; on day 2, d/c Prostat and increase to goal rate of 70 ml/h (1680 ml per day) to provide 2016 kcals, 126 gm protein, 1362 ml free water daily.  NUTRITION DIAGNOSIS: Inadequate oral intake related to inability to eat as evidenced by NPO status.   Goal: Intake to meet >90% of estimated nutrition needs.  Monitor:  TF tolerance/adequacy, weight trend, labs, vent status.  Reason for Assessment: MD Consult for TF initiation and management.  64 y.o. male  Admitting Dx: Respiratory Failure  ASSESSMENT: Patient admitted on 1/25 with c/o SOB, LE swelling up to abdomen. Patient decompensated with hypercarbic respiratory failure and alcohol withdrawal on 1/26, required intubation.   Nutrition Focused Physical Exam:  Subcutaneous Fat:  Orbital Region: WNL Upper Arm Region: WNL Thoracic and Lumbar Region: NA  Muscle:  Temple Region: mild depletion Clavicle Bone Region: WNL Clavicle and Acromion Bone Region: mild depletion Scapular Bone Region: NA Dorsal Hand: NA Patellar Region: WNL Anterior Thigh Region: WNL Posterior Calf Region: WNL  Edema: none  Received MD Consult for TF initiation and management. Discussed patient in ICU rounds today. Patient is at nutrition risk given history of ETOH abuse.  Patient is currently intubated on ventilator support MV: 11.4 L/min Temp (24hrs), Avg:98 F (36.7 C), Min:97.6 F (36.4 C), Max:98.8 F (37.1 C)  Propofol: none   Height: Ht Readings from Last 1 Encounters:  07/22/14 6' (1.829 m)    Weight: Wt Readings from Last 1 Encounters:  07/23/14 190 lb 4.1 oz (86.3 kg)    Ideal Body Weight: 80.9 kg  % Ideal Body Weight: 107%  Wt Readings from Last 10 Encounters:  07/23/14 190 lb 4.1 oz (86.3 kg)    Usual Body Weight: unknown  %  Usual Body Weight: N/A  BMI:  Body mass index is 25.8 kg/(m^2).  Estimated Nutritional Needs: Kcal: 1965 Protein: 125-140 gm Fluid: 2 L  Skin: intact  Diet Order:  NPO  EDUCATION NEEDS: -Education not appropriate at this time   Intake/Output Summary (Last 24 hours) at 07/23/14 1121 Last data filed at 07/23/14 1100  Gross per 24 hour  Intake 1866.8 ml  Output   7605 ml  Net -5738.2 ml    Last BM: 1/25   Labs:   Recent Labs Lab 07/21/14 2038 07/22/14 0530 07/23/14 0535  NA 138 138 139  K 4.0 3.9 2.5*  CL 95* 97 91*  CO2 32 28 34*  BUN 42* 40* 28*  CREATININE 1.31 1.14 1.34  CALCIUM 9.3 8.5 8.7  MG  --   --  1.4*  PHOS  --   --  2.2*  GLUCOSE 106* 86 106*    CBG (last 3)   Recent Labs  07/23/14 0013 07/23/14 0412 07/23/14 0829  GLUCAP 100* 106* 96    Scheduled Meds: . antiseptic oral rinse  7 mL Mouth Rinse QID  . aspirin  81 mg Oral Daily  . chlorhexidine  15 mL Mouth Rinse BID  . famotidine  20 mg Per Tube BID  . feeding supplement (PRO-STAT SUGAR FREE 64)  30 mL Per Tube BID  . feeding supplement (VITAL HIGH PROTEIN)  1,000 mL Per Tube Q24H  . folic acid  1 mg Oral Daily  . furosemide  40 mg Intravenous BID  . heparin  5,000 Units Subcutaneous 3 times per day  . ipratropium-albuterol  3 mL Nebulization Q4H  . multivitamin with minerals  1 tablet Oral Daily  . potassium chloride  10 mEq Intravenous Q1 Hr x 6  . potassium phosphate IVPB (mmol)  15 mmol Intravenous Once  . sodium chloride  3 mL Intravenous Q12H  . thiamine  100 mg Oral Daily    Continuous Infusions: . dexmedetomidine 0.4 mcg/kg/hr (07/23/14 0739)  . dextrose 5 % and 0.9% NaCl 40 mL/hr at 07/22/14 1300    Past Medical History  Diagnosis Date  . Heavy smoker   . CHF (congestive heart failure)   . Anxiety   . Shortness of breath dyspnea   . GERD (gastroesophageal reflux disease)     Past Surgical History  Procedure Laterality Date  . Spine surgery    . Leg surgery       Joaquin Courts, RD, LDN, CNSC Pager [phone] After Hours Pager [phone]

## 2014-07-23 NOTE — Progress Notes (Addendum)
PULMONARY / CRITICAL CARE MEDICINE   Name: [name] RombergClyde [name] MRN: [id number] DOB: 1950-08-10    ADMISSION DATE:  07/21/2014 CONSULTATION DATE:  07/22/14  REFERRING MD :  Dr. Randolm IdolFletke / [facility]PTS  CHIEF COMPLAINT: Acute Respiratory Failure   INITIAL PRESENTATION: 64 y/o M admitted 1/25 with c/o SOB, swelling to LE's up to abdomen.  Work up consistent with decompensated CHF, +/- CAP.  Pt decompensated with hypercarbic respiratory failure and alcohol withdraw on 1/26. There was a question of CAP as well though this was less likely. PCCM consulted and patient was intubated.    STUDIES:  1/25  CXR >> increased pulmonary vascularity, opacities in both lung bases  1/26  CTA Chest >> neg for PE, cardiac enlargement, passive congestion of liver, bilateral pleural effusions, basilar atx 1/26  ECHO EF 30-35 %, diffuse hypokinesis.  SIGNIFICANT EVENTS: 1/25  Admit for decompensated CHF 1/26 Transferred to ICU due to resp failure    HISTORY OF PRESENT ILLNESS:   64 y/o M, heavy smoker & ETOH abuse (reported 25 beers per day) with a PMH of anxiety, GERD, spinal surgery who [facility] with a 1-2 month history of worsening shortness of breath, difficulty lying flat, swelling in LE's up to abdomen.  On day of admission, he was found at home in the floor by a friend.  The patient had reportedly slipped out of his recliner in to the floor.  Per report, he has not seen an MD in > 7 years.    On arrival to the the ER, he was noted to be hypoxic with saturations into the 70's on room air.  Initial cxr was concerning for pulmonary edema and possible CAP.  The patient was admitted by FPTS, treated with IV lasix, abx, BiPAP and pan cultured. He was placed on the CIWA protocol.  The patient developed worsening respiratory distress and inability to come off bipap for any extended period of time. ABG assessed  - 7.296 / 71 / 82 / 33.  Of note, he developed generalized erythema post levaquin  administration.  PCCM consulted for hypercarbic respiratory failure.    PAST MEDICAL HISTORY :   has a past medical history of Heavy smoker; CHF (congestive heart failure); Anxiety; Shortness of breath dyspnea; and GERD (gastroesophageal reflux disease).  has past surgical history that includes Spine surgery and Leg Surgery.    SOCIAL HISTORY:  reports that he has been smoking Cigarettes.  He does not have any smokeless tobacco history on file. He reports that he drinks about 105.0 oz of alcohol per week. He reports that he does not use illicit drugs.  SUBJECTIVE:  Sedated. Does not opens eyes on voice.  VITAL SIGNS: Temp:  [97 F (36.1 C)-98.8 F (37.1 C)] 97.7 F (36.5 C) (01/27 0415) Pulse Rate:  [31-116] 46 (01/27 0600) Resp:  [14-23] 18 (01/27 0600) BP: (106-163)/(45-104) 125/83 mmHg (01/27 0600) SpO2:  [88 %-100 %] 93 % (01/27 0600) FiO2 (%):  [40 %-100 %] 40 % (01/27 0400) Weight:  [190 lb 4.1 oz (86.3 kg)-200 lb 6.4 oz (90.9 kg)] 190 lb 4.1 oz (86.3 kg) (01/27 0500)   HEMODYNAMICS:     VENTILATOR SETTINGS: Vent Mode:  [-] PRVC FiO2 (%):  [40 %-100 %] 40 % Set Rate:  [16 bmp-18 bmp] 18 bmp Vt Set:  [500 mL-620 mL] 620 mL PEEP:  [5 cmH20] 5 cmH20 Plateau Pressure:  [16 cmH20-22 cmH20] 18 cmH20   INTAKE / OUTPUT:  Intake/Output Summary (  Last 24 hours) at 07/23/14 0658 Last data filed at 07/23/14 0600  Gross per 24 hour  Intake 1674.2 ml  Output   6805 ml  Net -5130.8 ml    PHYSICAL EXAMINATION: General:  Chronically ill adult male, sedated Neuro:  RASS -2, does not open eyes. No focal neurologic deficits HEENT:  Intubated, moves air bilaterally, mm pink/dry Cardiovascular:  s1s2 rrr, no m/r/g Lungs:  On mechanical ventilation  Abdomen:  Obese/soft, bsx4 active  Musculoskeletal:  No edema. No acute deformities  Skin:  Warm/dry, generalized erythema,  No edema.  Chronic LE changes  LABS:  CBC  Recent Labs Lab 07/21/14 2038 07/22/14 0530 07/23/14 0535   WBC 8.5 8.3 6.4  HGB 17.2* 15.1 16.1  HCT 50.6 46.2 48.2  PLT 127* 110* 88*   Coag's  Recent Labs Lab 07/21/14 2106  INR 1.35   BMET  Recent Labs Lab 07/21/14 2038 07/22/14 0530 07/23/14 0535  NA 138 138 139  K 4.0 3.9 2.5*  CL 95* 97 91*  CO2 32 28 34*  BUN 42* 40* 28*  CREATININE 1.31 1.14 1.34  GLUCOSE 106* 86 106*   Electrolytes  Recent Labs Lab 07/21/14 2038 07/22/14 0530 07/23/14 0535  CALCIUM 9.3 8.5 8.7  MG  --   --  1.4*  PHOS  --   --  2.2*   Sepsis Markers No results for input(s): LATICACIDVEN, PROCALCITON, O2SATVEN in the last 168 hours.   ABG  Recent Labs Lab 07/21/14 2132 07/22/14 0950 07/22/14 1423  PHART 7.312* 7.296* 7.415  PCO2ART 66.6* 71.3* 52.8*  PO2ART 98.0 82.9 60.0*   Liver Enzymes  Recent Labs Lab 07/21/14 2038 07/23/14 0535  AST 51* 35  ALT 30 20  ALKPHOS 78 53  BILITOT 2.0* 2.5*  ALBUMIN 3.3* 2.4*   Cardiac Enzymes  Recent Labs Lab 07/22/14 0530 07/22/14 1025 07/22/14 1649  TROPONINI 0.21* 0.24* 0.23*   Glucose  Recent Labs Lab 07/22/14 1534 07/22/14 1925 07/23/14 0013 07/23/14 0412  GLUCAP 106* 92 100* 106*    Imaging Ct Angio Chest Pe W/cm &/or Wo Cm  07/22/2014   CLINICAL DATA:  Progressive dyspnea, swelling, and lethargy for 2 weeks. Initial symptoms 2 months ago.  EXAM: CT ANGIOGRAPHY CHEST WITH CONTRAST  TECHNIQUE: Multidetector CT imaging of the chest was performed using the standard protocol during bolus administration of intravenous contrast. Multiplanar CT image reconstructions and MIPs were obtained to evaluate the vascular anatomy.  CONTRAST:  80mL OMNIPAQUE IOHEXOL 350 MG/ML SOLN  COMPARISON:  None.  FINDINGS: Technically adequate study with good opacification of the central and segmental pulmonary arteries. No focal filling defects demonstrated. No evidence of significant pulmonary embolus.  Cardiac enlargement. Normal caliber thoracic aorta with calcification. Esophagus is decompressed.  No significant lymphadenopathy in the chest.  Bilateral pleural effusions with basilar atelectasis or consolidation, greater on the right. Emphysematous changes throughout the lungs. No pneumothorax.  Included portions of the upper abdominal organs demonstrate diffuse abdominal ascites. Reflux of contrast material into the hepatic veins is consistent with passive congestion. Degenerative changes in the spine. No destructive bone lesions.  Review of the MIP images confirms the above findings.  IMPRESSION: No evidence of significant pulmonary embolus. Cardiac enlargement with passive congestion of the liver. Bilateral pleural effusions and basilar atelectasis or consolidation, greater on the right. Findings suggest congestive heart failure. Diffuse abdominal ascites.   Electronically Signed   By: Burman Nieves M.D.   On: 07/22/2014 04:03   Dg Chest Port 1  View  07/22/2014   CLINICAL DATA:  LEFT jugular line, intubation, abdominal distension, acute respiratory failure, smoker, COPD, GERD, CHF, asthma  EXAM: PORTABLE CHEST - 1 VIEW  COMPARISON:  Portable exam 1147 hr compared to 07/21/2014  FINDINGS: Tip of endotracheal tube projects approximately 6.1 cm above carina.  Nasogastric tube extends into abdomen.  LEFT jugular central venous catheter tip projects over SVC.  Enlargement of cardiac silhouette with pulmonary vascular congestion.  Perihilar infiltrates bilaterally with RIGHT pleural effusion identified, favor pulmonary edema.  Atherosclerotic calcification aorta.  No pneumothorax or acute osseous findings.  IMPRESSION: No pneumothorax following central line placement.  Probable CHF with small RIGHT pleural effusion.   Electronically Signed   By: Ulyses Southward M.D.   On: 07/22/2014 12:18     ASSESSMENT / PLAN:  PULMONARY OETT 1/26 >> A: Acute Hypercarbic / Hypoxemic Respiratory Failure - ddx includes CHF, PNA (PE ruled OUT) Bilateral Pleural Effusions CAP unlikely  Tobacco Abuse Presumed COPD  P:    Still intubation SBT D/c antibiotics (azitho and rocephine) as not evidence of infection at all Duoneb + Q3 PRN albuterol given smoking hx   CARDIOVASCULAR CVL L IJ 1/26 >>  A:  Chronic Systolic CHF - mod LV/RV dysfunction on ECHO Alcoholic Cardiomyopathy  Mildly elevated trop Brady-tachy last night with atrial fibrillation on EKG P:  Cardiology following Diuresis as renal function / BP permit  Will start Aspirin 81 mg daily  Will address oral anticoagulation  Otherwise per cards  RENAL A:   No acute issues Hypokalemia Hypomagnesemia  P:   Replace potassium and magnesium Check magnesium and potassium q12hr Check phos level as well Trend BMP Lasix 40 mg BID   GASTROINTESTINAL A:   Protein Calorie Malnutrition - in the setting of ETOH abuse  P:   SUP:  Pepcid  Start TF today   HEMATOLOGIC A:   Macrocytic Anemia - in setting of ETOH abuse  Thrombocytopenia  P:  Monitor CBC DVT: Heparin, d/c if platelets drop <10k Monitor platelets closely  INFECTIOUS A:   R/O CAP - bibasilar airspace disease on CT (likely atx), effusions P:   BCx2 1/25 >>  Sputum 1/26 >>  Rocephin, start date 1/26>>1/27 Azithro, start date 1/26,>>1/27  ENDOCRINE A:    At Risk Hypoglycemia - in setting of presumed liver disease / NPO status P:   CBG Q4 while on TF  SSI   NEUROLOGIC A:   Acute Metabolic Encephalopathy - in setting of ETOH abuse / withdrawal  ETOH Abuse  P:   RASS goal: -1 Precedex gtt Monitor for withdrawal sx PRN ativan / fentanyl   FAMILY  - Updates:  Updated his brother Trey Paula Palleschi [phone]) today. 2/27. Patient is not married and does not have children.  - Inter-disciplinary family meet or Palliative Care meeting due by:  2/29   Case discussed with Dr Kendrick Fries.  Signed:  Dow Adolph, MD PGY-3 Internal Medicine Teaching Service Pager: [phone] 07/23/2014, 7:08 AM    Attending:  I have seen and examined the patient with nurse  practitioner/resident and agree with the note above.   Acute systolic heart failure exacerbation > pleural effusions bilaterally likely due to the same Lungs> no wheezing  CT chest reviewed, effusions, emphysema but no real consolidation  Plan Full vent support Diurese Stop antibiotics Feed  My cc time 40 minutes  Heber , MD [place] P[facility]M Pager: [phone] Cell: [phone] If no response, call [phone]

## 2014-07-23 NOTE — Progress Notes (Signed)
Family Medicine Teaching Service Daily Progress Note Intern Pager: [phone]  Patient name: [name] [name] Medical record number: [id number] Date of birth: [birth year] Age: 64 y.o. Gender: [name]  Primary Care Provider: No primary care provider on file. Consultants: CCM Code Status: Full  Pt Overview and Major Events to Date:  1/25 - admitted for dyspnea, anasarca, elevated BNP; presumed acute CHF. Hypoxic, placed on BiPap 1/26 - echo performed, intubated, transferred to CCM   Assessment and Plan: [name] [name] is a 64 y.o. [name] presenting with edema and dyspnea. No past medical history except tobacco and alcohol abuse.  Worsening respiratory, mental status condition, alcohol withdrawal necessitated intubation and transfer to CCU yesterday.   Appreciate CCM management of medical conditions.  See their note for further details.   Disposition: pending clinical improvement.  Subjective:  Visited pt at bedside.  Sedated and intubated. Net -5.1L overnight, still anasarcous.  Objective: Temp:  [97.6 F (36.4 C)-98.8 F (37.1 C)] 97.7 F (36.5 C) (01/27 0415) Pulse Rate:  [31-116] 46 (01/27 0600) Resp:  [14-23] 18 (01/27 0600) BP: (106-163)/(45-104) 125/83 mmHg (01/27 0600) SpO2:  [88 %-100 %] 93 % (01/27 0600) FiO2 (%):  [40 %-100 %] 40 % (01/27 0400) Weight:  [86.3 kg (190 lb 4.1 oz)-90.9 kg (200 lb 6.4 oz)] 86.3 kg (190 lb 4.1 oz) (01/27 0500) Physical Exam: General: Sedated, intubated. Cardiovascular: Irregularly irregular, normal rate, distant heart sounds Respiratory: crackles at bilateral lower bases Abdomen: nl BS, distended Extremities: anasarcous, doughy feel, UE>LE  Laboratory:  Recent Labs Lab 07/21/14 2038 07/22/14 0530 07/23/14 0535  WBC 8.5 8.3 6.4  HGB 17.2* 15.1 16.1  HCT 50.6 46.2 48.2  PLT 127* 110* 88*    Recent Labs Lab 07/21/14 2038 07/22/14 0530 07/23/14 0535  NA 138 138 139  K 4.0 3.9 2.5*  CL 95* 97 91*  CO2 32 28 34*  BUN 42* 40* 28*   CREATININE 1.31 1.14 1.34  CALCIUM 9.3 8.5 8.7  PROT 7.8  --  5.5*  BILITOT 2.0*  --  2.5*  ALKPHOS 78  --  53  ALT 30  --  20  AST 51*  --  35  GLUCOSE 106* 86 106*    Imaging/Diagnostic Tests: CXR 07/23/14: IMPRESSION: Allowing for differences in positioning there has not been dramatic interval change. There remains CHF with a small to moderate sized right pleural effusion. Left lower lobe atelectasis or pneumonia is noted but may have improved somewhat.   [name] SalesMary A Airyana [name], Med Student 07/23/2014, 8:20 AM MS4,  Family Medicine FPTS Intern pager: [phone], text pages welcome

## 2014-07-23 NOTE — Progress Notes (Signed)
OT Cancellation Note  Patient Details Name: Leonidas RombergClyde Labarre MRN: [id number] DOB: 10/31/1950   Cancelled Treatment:    Reason Eval/Treat Not Completed: Medical issues which prohibited therapy. Pt remains intubated and sedated. Signing off. Please reorder when appropriate.  Evern BioMayberry, Corey Caulfield Lynn 07/23/2014, 9:13 AM

## 2014-07-24 ENCOUNTER — Inpatient Hospital Stay (HOSPITAL_COMMUNITY): Payer: Self-pay

## 2014-07-24 DIAGNOSIS — I4891 Unspecified atrial fibrillation: Secondary | ICD-10-CM

## 2014-07-24 LAB — BASIC METABOLIC PANEL
ANION GAP: 9 (ref 5–15)
ANION GAP: 9 (ref 5–15)
BUN: 22 mg/dL (ref 6–23)
BUN: 19 mg/dL (ref 6–23)
CHLORIDE: 91 mmol/L — AB (ref 96–112)
CO2: 41 mmol/L (ref 19–32)
CO2: 39 mmol/L — ABNORMAL HIGH (ref 19–32)
CREATININE: 1.09 mg/dL (ref 0.50–1.35)
Calcium: 8.6 mg/dL (ref 8.4–10.5)
Calcium: 8.8 mg/dL (ref 8.4–10.5)
Chloride: 93 mmol/L — ABNORMAL LOW (ref 96–112)
Creatinine, Ser: 1.15 mg/dL (ref 0.50–1.35)
GFR calc Af Amer: 81 mL/min — ABNORMAL LOW (ref >90)
GFR calc non Af Amer: 65 mL/min — ABNORMAL LOW (ref >90)
GFR, EST AFRICAN AMERICAN: 76 mL/min — AB (ref >90)
GFR, EST NON AFRICAN AMERICAN: 70 mL/min — AB (ref >90)
GLUCOSE: 94 mg/dL (ref 70–99)
Glucose, Bld: 98 mg/dL (ref 70–99)
POTASSIUM: 3.3 mmol/L — AB (ref 3.5–5.1)
Potassium: 3.7 mmol/L (ref 3.5–5.1)
SODIUM: 141 mmol/L (ref 135–145)
Sodium: 141 mmol/L (ref 135–145)

## 2014-07-24 LAB — GLUCOSE, CAPILLARY
GLUCOSE-CAPILLARY: 102 mg/dL — AB (ref 70–99)
GLUCOSE-CAPILLARY: 94 mg/dL (ref 70–99)
Glucose-Capillary: 99 mg/dL (ref 70–99)
Glucose-Capillary: 92 mg/dL (ref 70–99)
Glucose-Capillary: 129 mg/dL — ABNORMAL HIGH (ref 70–99)
Glucose-Capillary: 112 mg/dL — ABNORMAL HIGH (ref 70–99)

## 2014-07-24 LAB — PHOSPHORUS: Phosphorus: 4.6 mg/dL (ref 2.3–4.6)

## 2014-07-24 LAB — MAGNESIUM: MAGNESIUM: 1.8 mg/dL (ref 1.5–2.5)

## 2014-07-24 MED ORDER — FAMOTIDINE 20 MG PO TABS
20.0000 mg | ORAL_TABLET | Freq: Two times a day (BID) | ORAL | Status: DC
  Administered 2014-07-24 – 2014-08-04 (×23): 20 mg via ORAL
  Filled 2014-07-24 (×25): qty 1

## 2014-07-24 MED ORDER — HALOPERIDOL LACTATE 5 MG/ML IJ SOLN: 1.0000 mg | INTRAMUSCULAR | Status: DC | PRN

## 2014-07-24 MED ORDER — NICOTINE 21 MG/24HR TD PT24
21.0000 mg | MEDICATED_PATCH | Freq: Every day | TRANSDERMAL | Status: DC
  Administered 2014-07-24 – 2014-08-04 (×12): 21 mg via TRANSDERMAL
  Filled 2014-07-24 (×12): qty 1

## 2014-07-24 MED ORDER — BISOPROLOL FUMARATE 5 MG PO TABS
2.5000 mg | ORAL_TABLET | Freq: Every day | ORAL | Status: DC
  Administered 2014-07-24 – 2014-07-25 (×2): 2.5 mg via ORAL
  Filled 2014-07-24 (×2): qty 0.5

## 2014-07-24 MED ORDER — POTASSIUM CHLORIDE 10 MEQ/50ML IV SOLN
10.0000 meq | INTRAVENOUS | Status: AC
  Administered 2014-07-24 (×3): 10 meq via INTRAVENOUS
  Filled 2014-07-24 (×3): qty 50

## 2014-07-24 MED ORDER — LORAZEPAM 2 MG/ML IJ SOLN
0.5000 mg | Freq: Four times a day (QID) | INTRAMUSCULAR | Status: DC | PRN
  Administered 2014-07-29: 0.5 mg via INTRAVENOUS
  Filled 2014-07-24 (×2): qty 1

## 2014-07-24 NOTE — Progress Notes (Signed)
PULMONARY / CRITICAL CARE MEDICINE   Name: [name] RombergClyde [name] MRN: [id number] DOB: 03-08-1951    ADMISSION DATE:  07/21/2014 CONSULTATION DATE:  07/22/14  REFERRING MD :  Dr. Randolm IdolFletke / FPTS  CHIEF COMPLAINT: Acute Respiratory Failure   INITIAL PRESENTATION: 64 y/o M admitted 1/25 with c/o SOB, swelling to LE's up to abdomen.  Work up consistent with decompensated CHF, +/- CAP.  Pt decompensated with hypercarbic respiratory failure and alcohol withdraw on 1/26. There was a question of CAP as well though this was less likely. PCCM consulted and patient was intubated.    STUDIES:  1/25  CXR >> increased pulmonary vascularity, opacities in both lung bases  1/26  CTA Chest >> neg for PE, cardiac enlargement, passive congestion of liver, bilateral pleural effusions, basilar atx 1/26  ECHO EF 30-35 %, diffuse hypokinesis.  SIGNIFICANT EVENTS: 1/25  Admit for decompensated CHF and intubated 1/26 Transferred to ICU due to resp failure 2/27 Self extubated    HISTORY OF PRESENT ILLNESS:   64 y/o M, heavy smoker & ETOH abuse (reported 25 beers per day) with a PMH of anxiety, GERD, spinal surgery who [name]25 with a 1-2 month history of worsening shortness of breath, difficulty lying flat, swelling in LE's up to abdomen.  On day of admission, he was found at home in the floor by a friend.  The patient had reportedly slipped out of his recliner in to the floor.  Per report, he has not seen an MD in > 7 years.   On arrival to the the ER, he was noted to be hypoxic with saturations into the 70's on room air.  Initial cxr was concerning for pulmonary edema and possible CAP.  The patient was admitted by FPTS, treated with IV lasix, abx, BiPAP and pan cultured. He was placed on the CIWA protocol.  The patient developed worsening respiratory distress and inability to come off bipap for any extended period of time. ABG assessed  - 7.296 / 71 / 82 / 33.  Of note, he developed generalized  erythema post levaquin administration.  PCCM consulted for hypercarbic respiratory failure.    SUBJECTIVE:  Alert and oriented. Self extubated last night  VITAL SIGNS: Temp:  [97.7 F (36.5 C)-99 F (37.2 C)] 98.1 F (36.7 C) (01/28 0737) Pulse Rate:  [50-119] 98 (01/28 1000) Resp:  [11-26] 22 (01/28 1000) BP: (91-134)/(54-78) 122/64 mmHg (01/28 1000) SpO2:  [85 %-96 %] 96 % (01/28 1000) FiO2 (%):  [50 %-100 %] 50 % (01/28 0900) Weight:  [183 lb 13.8 oz (83.4 kg)] 183 lb 13.8 oz (83.4 kg) (01/28 0435)   HEMODYNAMICS:     VENTILATOR SETTINGS: Vent Mode:  [-] PRVC FiO2 (%):  [50 %-100 %] 50 % Set Rate:  [18 bmp] 18 bmp Vt Set:  [620 mL] 620 mL PEEP:  [8 cmH20] 8 cmH20 Plateau Pressure:  [21 cmH20] 21 cmH20   INTAKE / OUTPUT:  Intake/Output Summary (Last 24 hours) at 07/24/14 1039 Last data filed at 07/24/14 0920  Gross per 24 hour  Intake 1248.57 ml  Output   3165 ml  Net -1916.43 ml    PHYSICAL EXAMINATION: General:  Chronically ill adult male, alert  Neuro: Obeys commands and follows commands. No focal neurologic deficits HEENT: on ventimask (50), moves air bilaterally, mm pink/dry Cardiovascular:  s1s2 rrr, no m/r/g Lungs:  CTA bilaterally.  Abdomen:  Obese/soft, bsx4 active  Musculoskeletal:  No edema. No acute deformities  Skin:  Warm/dry, generalized erythema,  No edema.  Chronic LE changes  LABS:  CBC  Recent Labs Lab 07/21/14 2038 07/22/14 0530 07/23/14 0535  WBC 8.5 8.3 6.4  HGB 17.2* 15.1 16.1  HCT 50.6 46.2 48.2  PLT 127* 110* 88*   Coag's  Recent Labs Lab 07/21/14 2106  INR 1.35   BMET  Recent Labs Lab 07/23/14 2243 07/24/14 0430 07/24/14 0937  NA 140 141 141  K 2.9* 3.7 3.3*  CL 90* 91* 93*  CO2 43* 41* 39*  BUN CREATININE 1.17 1.15 1.09  GLUCOSE 106* 94 98   Electrolytes  Recent Labs Lab 07/23/14 1043 07/23/14 1400  07/23/14 2243 07/24/14 0430 07/24/14 0937  CALCIUM 8.4  --   < > 8.9 8.6 8.8  MG 1.8  1.8  --  2.0  --  1.8  PHOS 1.8*  1.7*  --   --  4.0  --  4.6  < > = values in this interval not displayed. Sepsis Markers No results for input(s): LATICACIDVEN, PROCALCITON, O2SATVEN in the last 168 hours.   ABG  Recent Labs Lab 07/21/14 2132 07/22/14 0950 07/22/14 1423  PHART 7.312* 7.296* 7.415  PCO2ART 66.6* 71.3* 52.8*  PO2ART 98.0 82.9 60.0*   Liver Enzymes  Recent Labs Lab 07/21/14 2038 07/23/14 0535  AST 51* 35  ALT 30 20  ALKPHOS 78 53  BILITOT 2.0* 2.5*  ALBUMIN 3.3* 2.4*   Cardiac Enzymes  Recent Labs Lab 07/22/14 0530 07/22/14 1025 07/22/14 1649  TROPONINI 0.21* 0.24* 0.23*   Glucose  Recent Labs Lab 07/23/14 1250 07/23/14 1531 07/23/14 1929 07/24/14 0026 07/24/14 0352 07/24/14 0734  GLUCAP 115* 115* 119* 102* 99 94    Imaging Dg Chest Port 1 View  07/23/2014   CLINICAL DATA:  Respiratory failure  EXAM: PORTABLE CHEST - 1 VIEW  COMPARISON:  Portable chest x-ray of July 22, 2014  FINDINGS: There is a persistent moderate-sized right pleural effusion. The right hemidiaphragm remains obscured. On the left the lung is better aerated and the left heart border better demonstrated. The cardiac silhouette remains enlarged. The pulmonary vascularity is not engorged.  The endotracheal tube tip lies 4.5 cm above the crotch of the carina. The esophagogastric tube tip projects below the inferior margin of the image. The left internal jugular venous catheter tip projects over the midportion of the SVC.  IMPRESSION: Allowing for differences in positioning there has not been dramatic interval change. There remains CHF with a small to moderate sized right pleural effusion. Left lower lobe atelectasis or pneumonia is noted but may have improved somewhat.   Electronically Signed   By: David  [place]   On: 07/23/2014 07:44     ASSESSMENT / PLAN:  PULMONARY OETT 1/26 >>1/27 A: Acute Hypercarbic / Hypoxemic Respiratory Failure - ddx includes CHF, PNA (PE ruled  OUT) Bilateral Pleural Effusions CAP unlikely  Tobacco Abuse Presumed COPD  P:   Cont with vent mask and reassess need Off antibiotics Duoneb + Q3 PRN albuterol Nicotine patch  CARDIOVASCULAR CVL L IJ 1/26 >>  A:  Chronic Systolic CHF - mod LV/RV dysfunction on ECHO Alcoholic Cardiomyopathy  Mildly elevated trop Atrial fibrillation on EKG, CHAD2Vasc score 1 P:  Cardiology following Consider BB since BP is better today  Consider ARB as well  Diuresis as renal function Will cont with Aspirin is adequate based on his score Consider d/c CVL once off precedex  Otherwise per cards  RENAL A:   No  acute issues Hypokalemia Hypomagnesemia  P:   Replace potassium and magnesium Check magnesium and potassium q12hr Check phos level as well Trend BMP Lasix 40 mg BID   GASTROINTESTINAL A:   Protein Calorie Malnutrition - in the setting of ETOH abuse  P:   SLP and started oral feeds when able Will advance diet at tolerated with fluid restriction   HEMATOLOGIC A:   Macrocytic Anemia - in setting of ETOH abuse  Thrombocytopenia  P:  Monitor CBC D/c heparin due to low platelets. Cont with SCDs  INFECTIOUS A:   R/O CAP - bibasilar airspace disease on CT (likely atx), effusions P:   BCx2 1/25 >>  Sputum 1/26 >>  Off antibiotics   ENDOCRINE A:    At Risk Hypoglycemia - in setting of presumed liver disease / NPO status P:   Cont with CBG q4hr CBG acqhs when able to take orally SSI   NEUROLOGIC A:   Acute Metabolic Encephalopathy - in setting of ETOH abuse / withdrawal  ETOH Abuse  P:   Precedex gtt prn Keep Ativan prn Monitor for withdrawal sx  FAMILY  - Updates:  Updated his brother Emmerson Taddei [phone]) on 2/27. Patient is not married and does not have children.  Updated a family friend at bedside 1/28.  - Inter-disciplinary family meet or Palliative Care meeting due by:  2/29   Case discussed with Dr Kendrick Fries.  Signed:  Dow Adolph, MD PGY-3  Internal Medicine Teaching Service Pager: [phone] 07/24/2014, 10:39 AM   Attending:  I have seen and examined the patient with nurse practitioner/resident and agree with the note above.   He self extubated and did remarkably well Lungs: diminished bases, otherwise clear Neuro; awake and alert, answers questions  Acute encephalopathy> alcohol withdrawal related, use precedex overnight as needed, avoid too much benzo, haldol prn Acute respiratory failure with hypoxemia> due to Acute systolic CHF exacerbation> diuresis again today  PT consult, up to chair  SDU tomorrow  Heber Peotone, MD [place] [facility] Pager: [phone] Cell: [phone] If no response, call [phone]

## 2014-07-24 NOTE — Evaluation (Signed)
Physical Therapy Evaluation Patient Details Name: [name] [name] MRN: [id number] DOB: 1951-06-15 Today's Date: 07/24/2014   History of Present Illness  Pt adm with decompensated heart failure and was intubated on 1/26. Pt self extubated 1/27. PMH -  ETOH abuse, HTN, ?COPD   Clinical Impression  Pt admitted with above diagnosis. Pt currently with functional limitations due to the deficits listed below (see PT Problem List).  Pt will benefit from skilled PT to increase their independence and safety with mobility to allow discharge to the venue listed below.       Follow Up Recommendations SNF    Equipment Recommendations  None recommended by PT    Recommendations for Other Services       Precautions / Restrictions Precautions Precautions: Fall      Mobility  Bed Mobility Overal bed mobility: Needs Assistance Bed Mobility: Supine to Sit     Supine to sit: +2 for physical assistance;Mod assist     General bed mobility comments: Assist to bring legs off bed and to elevate trunk.  Transfers Overall transfer level: Needs assistance Equipment used: 2 person hand held assist Transfers: Sit to/from UGI CorporationStand;Stand Pivot Transfers Sit to Stand: +2 physical assistance;Mod assist Stand pivot transfers: +2 physical assistance;Mod assist       General transfer comment: Assist to bring hips up and for balance. Pt with flexed trunk and short shuffling pivotal steps to chair.  Ambulation/Gait                Stairs            Wheelchair Mobility    Modified Rankin (Stroke Patients Only)       Balance Overall balance assessment: Needs assistance Sitting-balance support: Bilateral upper extremity supported;Feet supported Sitting balance-Leahy Scale: Poor Sitting balance - Comments: Min guart for safety.   Standing balance support: Bilateral upper extremity supported Standing balance-Leahy Scale: Poor Standing balance comment: 2 person assist for static standing.                              Pertinent Vitals/Pain Pain Assessment: No/denies pain    Home Living Family/patient expects to be discharged to:: Private residence Living Arrangements: Alone   Type of Home: House       Home Layout: One level Home Equipment: Environmental consultantWalker - 2 wheels      Prior Function Level of Independence: Independent with assistive device(s)         Comments: Uses walker at times     Hand Dominance        Extremity/Trunk Assessment   Upper Extremity Assessment: Defer to OT evaluation           Lower Extremity Assessment: Generalized weakness         Communication   Communication: No difficulties  Cognition Arousal/Alertness: Awake/alert Behavior During Therapy: WFL for tasks assessed/performed Overall Cognitive Status: No family/caregiver present to determine baseline cognitive functioning Area of Impairment: Problem solving             Problem Solving: Slow processing;Requires verbal cues;Requires tactile cues      General Comments      Exercises        Assessment/Plan    PT Assessment Patient needs continued PT services  PT Diagnosis Difficulty walking;Generalized weakness   PT Problem List Decreased strength;Decreased cognition;Decreased knowledge of use of DME;Decreased safety awareness;Decreased activity tolerance;Decreased balance;Decreased knowledge of precautions;Decreased mobility;Decreased coordination  PT Treatment Interventions DME instruction;Balance  training;Gait training;Cognitive remediation;Functional mobility training;Patient/family education;Therapeutic activities;Therapeutic exercise   PT Goals (Current goals can be found in the Care Plan section) Acute Rehab PT Goals Patient Stated Goal: return home PT Goal Formulation: With patient Time For Goal Achievement: 08/07/14 Potential to Achieve Goals: Good    Frequency Min 3X/week   Barriers to discharge Decreased caregiver support      Co-evaluation                End of Session Equipment Utilized During Treatment: Oxygen Activity Tolerance: Patient limited by fatigue Patient left: in chair;with call bell/phone within reach;with nursing/sitter in room Nurse Communication: Mobility status         Time: 1610-9604 PT Time Calculation (min) (ACUTE ONLY): 13 min   Charges:   PT Evaluation $Initial PT Evaluation Tier I: 1 Procedure     PT G Codes:        Kambria Grima 07-28-14, 1:58 PM  [facility] PT [phone]

## 2014-07-24 NOTE — Progress Notes (Signed)
CRITICAL VALUE ALERT  Critical value received: BMET CO2 41  Date of notification:  07/24/14  Time of notification:  0618  Critical value read back:Yes.    Nurse who received alert:  Stefani DamaKristina Eshal Propps   MD notified (1st page):  Dr. Beckie Saltsivet  Time of first page:  0630  Responding MD:  Dr. Beckie Saltsivet  Time MD responded: 0630

## 2014-07-24 NOTE — Progress Notes (Addendum)
PROGRESS NOTE  Subjective:    64 yo man with hx of heavy ETOH abuse, smoker, family hx of CAD.  Presents with lethargy, swelling, and progressive dyspnea.  He is gradually improving  Objective:    Vital Signs:   Temp:  [97.7 F (36.5 C)-99 F (37.2 C)] 97.8 F (36.6 C) (01/28 1206) Pulse Rate:  [50-119] 95 (01/28 1100) Resp:  [17-25] 19 (01/28 1100) BP: (91-145)/(54-82) 145/82 mmHg (01/28 1100) SpO2:  [85 %-96 %] 90 % (01/28 1145) FiO2 (%):  [50 %-100 %] 50 % (01/28 0900) Weight:  [183 lb 13.8 oz (83.4 kg)] 183 lb 13.8 oz (83.4 kg) (01/28 0435)  Last BM Date: 07/21/14   24-hour weight change: Weight change: -16 lb 8.6 oz (-7.5 kg)  Weight trends: Filed Weights   07/22/14 1318 07/23/14 0500 07/24/14 0435  Weight: 200 lb 6.4 oz (90.9 kg) 190 lb 4.1 oz (86.3 kg) 183 lb 13.8 oz (83.4 kg)    Intake/Output:  01/27 0701 - 01/28 0700 In: 1417 [I.V.:343.2; NG/GT:118.8; IV Piggyback:955] Out: 4040 [Urine:4040] Total I/O In: 14.6 [I.V.:14.6] Out: 775 [Urine:775]   Physical Exam: BP 145/82 mmHg  Pulse 95  Temp(Src) 97.8 F (36.6 C) (Oral)  Resp 19  Ht 6' (1.829 m)  Wt 183 lb 13.8 oz (83.4 kg)  BMI 24.93 kg/m2  SpO2 90%  Wt Readings from Last 3 Encounters:  07/24/14 183 lb 13.8 oz (83.4 kg)    General: Vital signs reviewed and noted. Chronically ill appearing man, awake , alert today   Head: Normocephalic, atraumatic.  On [place] O2   Eyes: Closed   Throat: Normal   Neck:  thin  Lungs:    no wheezing, but markedly reduced breath sounds bilaterally   Heart:  RR, with very frequent premature beats   Abdomen:  Soft, non-tender, non-distended    Extremities: 1-  leg edema,  All extremities are very red.  Appears to have chronic changes.    Neurologic: Grossly intact  Psych: Greatly improved.     Labs: BMET:  Recent Labs  07/23/14 2243 07/24/14 0430 07/24/14 0937  NA 140 141 141  K 2.9* 3.7 3.3*  CL 90* 91* 93*  CO2 43* 41* 39*  GLUCOSE 106* 94 98   BUN 23 22 19   CREATININE 1.17 1.15 1.09  CALCIUM 8.9 8.6 8.8  MG 2.0  --  1.8  PHOS 4.0  --  4.6    Liver function tests:  Recent Labs  07/21/14 2038 07/23/14 0535  AST 51* 35  ALT 30 20  ALKPHOS 78 53  BILITOT 2.0* 2.5*  PROT 7.8 5.5*  ALBUMIN 3.3* 2.4*   No results for input(s): LIPASE, AMYLASE in the last 72 hours.  CBC:  Recent Labs  07/21/14 2038 07/22/14 0530 07/23/14 0535  WBC 8.5 8.3 6.4  NEUTROABS 6.7  --   --   HGB 17.2* 15.1 16.1  HCT 50.6 46.2 48.2  MCV 107.7* 109.0* 103.2*  PLT 127* 110* 88*    Cardiac Enzymes:  Recent Labs  07/21/14 2106 07/22/14 0530 07/22/14 1025 07/22/14 1649  TROPONINI 0.21* 0.21* 0.24* 0.23*    Coagulation Studies:  Recent Labs  07/21/14 2106  LABPROT 16.8*  INR 1.35    Other: Invalid input(s): POCBNP No results for input(s): DDIMER in the last 72 hours.  Recent Labs  07/22/14 0530  HGBA1C 6.1*    Recent Labs  07/22/14 0530  CHOL 97  HDL 12*  LDLCALC 68  TRIG 83  CHOLHDL 8.1    Recent Labs  07/22/14 0530  TSH 2.175   No results for input(s): VITAMINB12, FOLATE, FERRITIN, TIBC, IRON, RETICCTPCT in the last 72 hours.   Other results:  EKG :  ? Sinus tach with PACs,  Vs. Afib.   Lots of artifact.   Will repeat ECG once he is more stable.   Medications:    Infusions: . dexmedetomidine Stopped (07/24/14 1011)  . feeding supplement (VITAL AF 1.2 CAL) Stopped (07/23/14 1855)    Scheduled Medications: . antiseptic oral rinse  7 mL Mouth Rinse QID  . aspirin  81 mg Oral Daily  . chlorhexidine  15 mL Mouth Rinse BID  . famotidine  20 mg Oral BID  . folic acid  1 mg Oral Daily  . furosemide  40 mg Intravenous BID  . ipratropium-albuterol  3 mL Nebulization Q4H  . multivitamin with minerals  1 tablet Oral Daily  . nicotine  21 mg Transdermal Daily  . sodium chloride  3 mL Intravenous Q12H  . thiamine  100 mg Oral Daily   Personal review of tele:  NSR with PACs   Assessment/ Plan:     1. Chronic systolic CHf:  The patient has a long hx of heavy ETOH drinking ( 25 beers a day) he has moderate LV and RV dysfunction.    LV EF is 30-35% , mild MR , moderate RV dysfunction.   Also has hx of heavy smoking.  Still wheezing.   Will hopefully be able to start  ARB soon.  He is not wheezing today . Will start bisoprolol 2.5 a day .  This may help with his tachycardia.    2. 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  Will need to watch for DTs.      Disposition:  Length of Stay: 3  Vesta Mixer, Montez Hageman., MD, [facility] 07/24/2014, 1:30 PM Office [phone] Pager [phone]

## 2014-07-24 NOTE — Progress Notes (Signed)
CRITICAL VALUE ALERT  Critical value received: BMET CO2 43  Date of notification: 07/23/14  Time of notification: 2340  Critical value read back:Yes.    Nurse who received alert:  Stefani DamaKristina Broghan Pannone   MD notified (1st page):  Dr. Tasia CatchingsAhmed  Time of first page:  2346  Responding MD:  Dr. Tasia CatchingsAhmed  Time MD responded: [phone]

## 2014-07-25 LAB — BASIC METABOLIC PANEL
ANION GAP: 8 (ref 5–15)
Anion gap: 7 (ref 5–15)
BUN: 17 mg/dL (ref 6–23)
BUN: 18 mg/dL (ref 6–23)
CALCIUM: 8.9 mg/dL (ref 8.4–10.5)
CALCIUM: 9.1 mg/dL (ref 8.4–10.5)
CHLORIDE: 85 mmol/L — AB (ref 96–112)
CO2: 46 mmol/L — AB (ref 19–32)
CO2: 45 mmol/L (ref 19–32)
CREATININE: 1.07 mg/dL (ref 0.50–1.35)
Chloride: 86 mmol/L — ABNORMAL LOW (ref 96–112)
Creatinine, Ser: 1.24 mg/dL (ref 0.50–1.35)
GFR calc Af Amer: 83 mL/min — ABNORMAL LOW (ref >90)
GFR calc non Af Amer: 71 mL/min — ABNORMAL LOW (ref >90)
GFR, EST AFRICAN AMERICAN: 69 mL/min — AB (ref >90)
GFR, EST NON AFRICAN AMERICAN: 60 mL/min — AB (ref >90)
GLUCOSE: 93 mg/dL (ref 70–99)
GLUCOSE: 122 mg/dL — AB (ref 70–99)
POTASSIUM: 2.9 mmol/L — AB (ref 3.5–5.1)
Potassium: 4.3 mmol/L (ref 3.5–5.1)
SODIUM: 139 mmol/L (ref 135–145)
SODIUM: 138 mmol/L (ref 135–145)

## 2014-07-25 LAB — GLUCOSE, CAPILLARY
GLUCOSE-CAPILLARY: 98 mg/dL (ref 70–99)
Glucose-Capillary: 100 mg/dL — ABNORMAL HIGH (ref 70–99)
Glucose-Capillary: 101 mg/dL — ABNORMAL HIGH (ref 70–99)
Glucose-Capillary: 113 mg/dL — ABNORMAL HIGH (ref 70–99)

## 2014-07-25 LAB — CBC
HCT: 45.9 % (ref 39.0–52.0)
Hemoglobin: 15.2 g/dL (ref 13.0–17.0)
MCH: 34.5 pg — ABNORMAL HIGH (ref 26.0–34.0)
MCHC: 33.1 g/dL (ref 30.0–36.0)
MCV: 104.3 fL — ABNORMAL HIGH (ref 78.0–100.0)
PLATELETS: 82 10*3/uL — AB (ref 150–400)
RBC: 4.4 MIL/uL (ref 4.22–5.81)
RDW: 14.8 % (ref 11.5–15.5)
WBC: 8.4 10*3/uL (ref 4.0–10.5)

## 2014-07-25 LAB — MAGNESIUM
Magnesium: 1.6 mg/dL (ref 1.5–2.5)
Magnesium: 1.6 mg/dL (ref 1.5–2.5)

## 2014-07-25 MED ORDER — POTASSIUM CHLORIDE CRYS ER 20 MEQ PO TBCR
40.0000 meq | EXTENDED_RELEASE_TABLET | Freq: Once | ORAL | Status: AC
  Administered 2014-07-25: 40 meq via ORAL
  Filled 2014-07-25: qty 2

## 2014-07-25 MED ORDER — POTASSIUM CHLORIDE CRYS ER 20 MEQ PO TBCR
20.0000 meq | EXTENDED_RELEASE_TABLET | Freq: Two times a day (BID) | ORAL | Status: DC
  Administered 2014-07-25 – 2014-07-31 (×13): 20 meq via ORAL
  Filled 2014-07-25 (×17): qty 1

## 2014-07-25 MED ORDER — BISOPROLOL FUMARATE 5 MG PO TABS
2.5000 mg | ORAL_TABLET | Freq: Once | ORAL | Status: AC
Start: 2014-07-25 — End: 2014-07-25
  Administered 2014-07-25: 2.5 mg via ORAL
  Filled 2014-07-25: qty 0.5

## 2014-07-25 MED ORDER — POTASSIUM CHLORIDE 10 MEQ/100ML IV SOLN
10.0000 meq | Freq: Once | INTRAVENOUS | Status: AC
  Administered 2014-07-25: 10 meq via INTRAVENOUS

## 2014-07-25 MED ORDER — POTASSIUM CHLORIDE 10 MEQ/50ML IV SOLN
10.0000 meq | INTRAVENOUS | Status: DC
  Administered 2014-07-25 (×3): 10 meq via INTRAVENOUS
  Filled 2014-07-25 (×4): qty 50

## 2014-07-25 MED ORDER — ETOMIDATE 2 MG/ML IV SOLN
INTRAVENOUS | Status: AC
  Filled 2014-07-25: qty 10

## 2014-07-25 MED ORDER — MAGNESIUM OXIDE 400 (241.3 MG) MG PO TABS
400.0000 mg | ORAL_TABLET | Freq: Two times a day (BID) | ORAL | Status: AC
  Administered 2014-07-25 (×2): 400 mg via ORAL
  Filled 2014-07-25 (×2): qty 1

## 2014-07-25 MED ORDER — BISOPROLOL FUMARATE 5 MG PO TABS
5.0000 mg | ORAL_TABLET | Freq: Every day | ORAL | Status: DC
  Administered 2014-07-26 – 2014-08-04 (×10): 5 mg via ORAL
  Filled 2014-07-25 (×10): qty 1

## 2014-07-25 NOTE — Progress Notes (Signed)
MD Desma Maximracy McLean was notified that pt K+ was 2.9. Orders given, see [place] PlanasMar   Addley Ballinger, RN

## 2014-07-25 NOTE — Evaluation (Signed)
Occupational Therapy Evaluation Patient Details Name: [name] RombergClyde [name] MRN: [id number] DOB: 1950-11-02 Today's Date: 07/25/2014    History of Present Illness Pt adm with decompensated heart failure and was intubated on 1/26. Pt self extubated 1/27. PMH -  ETOH abuse, HTN, ?COPD    Clinical Impression   Pt was independent prior to admission, living alone.  Pt presents with generalized weakness and impaired balance interfering with ability to perform self care and ADL transfers. Pt is not ambulatory this visit. Per brothers, pt's behavior and cognition appear near baseline with only memory deficits.  Will follow acutely.    Follow Up Recommendations  SNF    Equipment Recommendations  Other (comment) (TBD)    Recommendations for Other Services       Precautions / Restrictions Precautions Precautions: Fall Restrictions Weight Bearing Restrictions: No      Mobility Bed Mobility               General bed mobility comments: pt up in chair  Transfers Overall transfer level: Needs assistance   Transfers: Sit to/from Stand;Stand Pivot Transfers Sit to Stand: Mod assist;Max assist Stand pivot transfers: Mod assist       General transfer comment: assist to rise and for stability    Balance Overall balance assessment: Needs assistance Sitting-balance support: Feet supported Sitting balance-Leahy Scale: Fair       Standing balance-Leahy Scale: Poor                              ADL Overall ADL's : Needs assistance/impaired Eating/Feeding: Independent;Sitting   Grooming: Wash/dry hands;Wash/dry face;Sitting;Set up   Upper Body Bathing: Set up;Sitting   Lower Body Bathing: Maximal assistance;Sit to/from stand   Upper Body Dressing : Minimal assistance;Sitting   Lower Body Dressing: Sit to/from stand;Total assistance   Toilet Transfer: Moderate assistance;Stand-pivot   Toileting- Clothing Manipulation and Hygiene: Minimal assistance;Sitting/lateral  lean       Functional mobility during ADLs:  (no able to ambulate) General ADL Comments: Pt unable to cross foot over opposite knee to reach feet.     Vision                     Perception     Praxis      Pertinent Vitals/Pain Pain Assessment: No/denies pain     Hand Dominance Right   Extremity/Trunk Assessment Upper Extremity Assessment Upper Extremity Assessment: Generalized weakness   Lower Extremity Assessment Lower Extremity Assessment: Defer to PT evaluation       Communication Communication Communication: No difficulties   Cognition Arousal/Alertness: Awake/alert Behavior During Therapy: WFL for tasks assessed/performed Overall Cognitive Status: Impaired/Different from baseline       Memory: Decreased short-term memory             General Comments       Exercises       Shoulder Instructions      Home Living Family/patient expects to be discharged to:: Private residence Living Arrangements: Alone   Type of Home: Apartment Home Access: Level entry     Home Layout: One level     Bathroom Shower/Tub: Chief Strategy OfficerTub/shower unit   Bathroom Toilet: Standard     Home Equipment: Environmental consultantWalker - 2 wheels          Prior Functioning/Environment Level of Independence: Independent with assistive device(s)        Comments: Uses walker at times    OT Diagnosis: Generalized weakness;Cognitive  deficits   OT Problem List: Decreased strength;Decreased activity tolerance;Impaired balance (sitting and/or standing);Decreased safety awareness;Decreased knowledge of use of DME or AE   OT Treatment/Interventions: Self-care/ADL training;DME and/or AE instruction;Therapeutic activities;Balance training;Patient/family education    OT Goals(Current goals can be found in the care plan section) Acute Rehab OT Goals Patient Stated Goal: return home OT Goal Formulation: With patient Time For Goal Achievement: 08/08/14 Potential to Achieve Goals: Good ADL  Goals Pt Will Perform Grooming: with supervision;standing Pt Will Perform Lower Body Bathing: with supervision;sit to/from stand Pt Will Perform Lower Body Dressing: with supervision;sit to/from stand Pt Will Transfer to Toilet: with supervision;ambulating;regular height toilet Pt Will Perform Toileting - Clothing Manipulation and hygiene: with supervision;sit to/from stand  OT Frequency: Min 2X/week   Barriers to D/C: Decreased caregiver support          Co-evaluation              End of Session Nurse Communication: Mobility status  Activity Tolerance: Patient limited by fatigue Patient left: in chair;with call bell/phone within reach;with nursing/sitter in room   Time: 0865-7846 OT Time Calculation (min): 31 min Charges:  OT General Charges $OT Visit: 1 Procedure OT Evaluation $Initial OT Evaluation Tier I: 1 Procedure OT Treatments $Self Care/Home Management : 8-22 mins G-Codes:    Evern Bio 07/25/2014, 1:50 PM  [phone]

## 2014-07-25 NOTE — Progress Notes (Signed)
PROGRESS NOTE  Subjective:    64 yo man with hx of heavy ETOH abuse, smoker, family hx of CAD.  Presents with lethargy, swelling, and progressive dyspnea.  Up sitting in the chair today  Objective:    Vital Signs:   Temp:  [97.5 F (36.4 C)-98.7 F (37.1 C)] 98.3 F (36.8 C) (01/29 0800) Pulse Rate:  [43-144] 108 (01/29 0800) Resp:  [18-34] 25 (01/29 0800) BP: (106-145)/(46-85) 120/81 mmHg (01/29 0800) SpO2:  [89 %-100 %] 95 % (01/29 0909)  Last BM Date: 07/21/14   24-hour weight change: Weight change:   Weight trends: Filed Weights   07/22/14 1318 07/23/14 0500 07/24/14 0435  Weight: 200 lb 6.4 oz (90.9 kg) 190 lb 4.1 oz (86.3 kg) 183 lb 13.8 oz (83.4 kg)    Intake/Output:  01/28 0701 - 01/29 0700 In: 1454.6 [P.O.:1440; I.V.:14.6] Out: 2750 [Urine:2750] Total I/O In: 100 [IV Piggyback:100] Out: -    Physical Exam: BP 120/81 mmHg  Pulse 108  Temp(Src) 98.3 F (36.8 C) (Oral)  Resp 25  Ht 6' (1.829 m)  Wt 183 lb 13.8 oz (83.4 kg)  BMI 24.93 kg/m2  SpO2 95%  Wt Readings from Last 3 Encounters:  07/24/14 183 lb 13.8 oz (83.4 kg)    General: Vital signs reviewed and noted. Chronically ill appearing man, awake , alert today   Head: Normocephalic, atraumatic.  On [place] O2   Eyes: Closed   Throat: Normal   Neck:  thin  Lungs:    no wheezing, but markedly reduced breath sounds bilaterally   Heart:  RR, with very frequent premature beats   Abdomen:  Soft, non-tender, non-distended    Extremities: 1-  leg edema,  All extremities are very red.  Appears to have chronic changes.    Neurologic: Grossly intact  Psych: Greatly improved.     Labs: BMET:  Recent Labs  07/23/14 2243  07/24/14 0937 07/25/14 0415  NA 140  < > 141 139  K 2.9*  < > 3.3* 2.9*  CL 90*  < > 93* 85*  CO2 43*  < > 39* 46*  GLUCOSE 106*  < > 98 93  BUN 23  < > 19 17  CREATININE 1.17  < > 1.09 1.07  CALCIUM 8.9  < > 8.8 8.9  MG 2.0  --  1.8 1.6  PHOS 4.0  --  4.6  --     < > = values in this interval not displayed.  Liver function tests:  Recent Labs  07/23/14 0535  AST 35  ALT 20  ALKPHOS 53  BILITOT 2.5*  PROT 5.5*  ALBUMIN 2.4*   No results for input(s): LIPASE, AMYLASE in the last 72 hours.  CBC:  Recent Labs  07/23/14 0535 07/25/14 0415  WBC 6.4 8.4  HGB 16.1 15.2  HCT 48.2 45.9  MCV 103.2* 104.3*  PLT 88* 82*    Cardiac Enzymes:  Recent Labs  07/22/14 1649  TROPONINI 0.23*    Coagulation Studies: No results for input(s): LABPROT, INR in the last 72 hours.  Other: Invalid input(s): POCBNP No results for input(s): DDIMER in the last 72 hours. No results for input(s): HGBA1C in the last 72 hours. No results for input(s): CHOL, HDL, LDLCALC, TRIG, CHOLHDL in the last 72 hours. No results for input(s): TSH, T4TOTAL, T3FREE, THYROIDAB in the last 72 hours.  Invalid input(s): FREET3 No results for input(s): VITAMINB12, FOLATE, FERRITIN, TIBC, IRON, RETICCTPCT in the last 72  hours.   Other results:  EKG :  ? Sinus tach with PACs,  Vs. Afib.   Lots of artifact.   Will repeat ECG once he is more stable.   Medications:    Infusions: . feeding supplement (VITAL AF 1.2 CAL) Stopped (07/23/14 1855)    Scheduled Medications: . aspirin  81 mg Oral Daily  . bisoprolol  2.5 mg Oral Daily  . famotidine  20 mg Oral BID  . folic acid  1 mg Oral Daily  . furosemide  40 mg Intravenous BID  . ipratropium-albuterol  3 mL Nebulization Q4H  . multivitamin with minerals  1 tablet Oral Daily  . nicotine  21 mg Transdermal Daily  . sodium chloride  3 mL Intravenous Q12H  . thiamine  100 mg Oral Daily   Personal review of tele:  NSR with PACs   Assessment/ Plan:    1. Chronic systolic CHf:  The patient has a long hx of heavy ETOH drinking ( 25 beers a day) he has moderate LV and RV dysfunction.    LV EF is 30-35% , mild MR , moderate RV dysfunction.   Also has hx of heavy smoking.  Still wheezing.   Will hopefully be able to  start  ARB soon.  He is not wheezing today . Will increase  bisoprolol to 5 mg  a day .  This may help with his tachycardia.  ECG daily   2. 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  Will need to watch for DTs.      Disposition:  Length of Stay: 4  Vesta Mixer, Montez Hageman., MD, [facility] 07/25/2014, 10:47 AM Office [phone] Pager [phone]

## 2014-07-25 NOTE — Progress Notes (Signed)
CRITICAL VALUE ALERT  Critical value received:  CO2 46  Date of notification:  07/25/14  Time of notification:  0600  Critical value read back: Yes  Nurse who received alert:  Olegario MessierBryce S  MD notified (1st page):  Desma Maximracy McLean, MD  Time of first page:  0600  MD notified (2nd page):  Time of second page:  Responding MD:  Desma Maximracy McLean  Time MD responded:  0600  No orders given at this time.

## 2014-07-25 NOTE — Progress Notes (Signed)
PULMONARY / CRITICAL CARE MEDICINE   Name: [name] MRN: [id number] DOB: Jun 23, 1951    ADMISSION DATE:  07/21/2014 CONSULTATION DATE:  07/22/14  REFERRING MD :  Dr. Randolm Idol / FPTS  CHIEF COMPLAINT: Acute Respiratory Failure   INITIAL PRESENTATION: 64 y/o M admitted 1/25 with c/o SOB, swelling to LE's up to abdomen.  Work up consistent with decompensated CHF, +/- CAP.  Pt decompensated with hypercarbic respiratory failure and alcohol withdraw on 1/26. There was a question of CAP as well though this was less likely. PCCM consulted and patient was intubated.    STUDIES:  1/25  CXR >> increased pulmonary vascularity, opacities in both lung bases  1/26  CTA Chest >> neg for PE, cardiac enlargement, passive congestion of liver, bilateral pleural effusions, basilar atx 1/26  ECHO EF 30-35 %, diffuse hypokinesis.  SIGNIFICANT EVENTS: 1/25  Admit for decompensated CHF and intubated 1/26 Transferred to ICU due to resp failure 2/27 Self extubated    HISTORY OF PRESENT ILLNESS:   64 y/o M, heavy smoker & ETOH abuse (reported 25 beers per day) with a PMH of anxiety, GERD, spinal surgery who presented to [name] on 1/25 with a 1-2 month history of worsening shortness of breath, difficulty lying flat, swelling in LE's up to abdomen.  On day of admission, he was found at home in the floor by a friend.  The patient had reportedly slipped out of his recliner in to the floor.  Per report, he has not seen an MD in > 7 years.   On arrival to the the ER, he was noted to be hypoxic with saturations into the 70's on room air.  Initial cxr was concerning for pulmonary edema and possible CAP.  The patient was admitted by FPTS, treated with IV lasix, abx, BiPAP and pan cultured. He was placed on the CIWA protocol.  The patient developed worsening respiratory distress and inability to come off bipap for any extended period of time. ABG assessed  - 7.296 / 71 / 82 / 33.  Of note, he developed generalized  erythema post levaquin administration.  PCCM consulted for hypercarbic respiratory failure.    SUBJECTIVE:  Doing well overnight  No agitation   VITAL SIGNS: Temp:  [97.5 F (36.4 C)-98.7 F (37.1 C)] 98.7 F (37.1 C) (01/29 0351) Pulse Rate:  [43-144] 94 (01/29 0500) Resp:  [18-34] 28 (01/29 0500) BP: (106-145)/(46-85) 117/62 mmHg (01/29 0500) SpO2:  [90 %-100 %] 91 % (01/29 0500) FiO2 (%):  [50 %] 50 % (01/28 0900)   HEMODYNAMICS:     VENTILATOR SETTINGS: Vent Mode:  [-]  FiO2 (%):  [50 %] 50 %   INTAKE / OUTPUT:  Intake/Output Summary (Last 24 hours) at 07/25/14 0749 Last data filed at 07/25/14 0100  Gross per 24 hour  Intake 1454.64 ml  Output   2750 ml  Net -1295.36 ml    PHYSICAL EXAMINATION: General:  Chronically ill adult male, alert  Neuro: Obeys commands and follows commands. No focal neurologic deficits HEENT: on [place] oxygen, moves air bilaterally, mm pink/dry Cardiovascular:  s1s2 rrr, no m/r/g Lungs:  CTA bilaterally.  Abdomen:  Obese/soft, bsx4 active  Musculoskeletal:  No edema. No acute deformities  Skin:  Warm/dry, generalized erythema,  No edema.  Chronic LE changes  LABS:  CBC  Recent Labs Lab 07/22/14 0530 07/23/14 0535 07/25/14 0415  WBC 8.3 6.4 8.4  HGB 15.1 16.1 15.2  HCT 46.2 48.2 45.9  PLT 110* 88* 82*  Coag's  Recent Labs Lab 07/21/14 2106  INR 1.35   BMET  Recent Labs Lab 07/24/14 0430 07/24/14 0937 07/25/14 0415  NA 141 141 139  K 3.7 3.3* 2.9*  CL 91* 93* 85*  CO2 41* 39* 46*  BUN CREATININE 1.15 1.09 1.07  GLUCOSE 94 98 93   Electrolytes  Recent Labs Lab 07/23/14 1043  07/23/14 2243 07/24/14 0430 07/24/14 0937 07/25/14 0415  CALCIUM 8.4  < > 8.9 8.6 8.8 8.9  MG 1.8  < > 2.0  --  1.8 1.6  PHOS 1.8*  1.7*  --  4.0  --  4.6  --   < > = values in this interval not displayed. Sepsis Markers No results for input(s): LATICACIDVEN, PROCALCITON, O2SATVEN in the last 168 hours.    ABG  Recent Labs Lab 07/21/14 2132 07/22/14 0950 07/22/14 1423  PHART 7.312* 7.296* 7.415  PCO2ART 66.6* 71.3* 52.8*  PO2ART 98.0 82.9 60.0*   Liver Enzymes  Recent Labs Lab 07/21/14 2038 07/23/14 0535  AST 51* 35  ALT 30 20  ALKPHOS 78 53  BILITOT 2.0* 2.5*  ALBUMIN 3.3* 2.4*   Cardiac Enzymes  Recent Labs Lab 07/22/14 0530 07/22/14 1025 07/22/14 1649  TROPONINI 0.21* 0.24* 0.23*   Glucose  Recent Labs Lab 07/24/14 0734 07/24/14 1138 07/24/14 1532 07/24/14 1916 07/25/14 0016 07/25/14 0353  GLUCAP 94 92 129* 112* 98 100*    Imaging Dg Chest Port 1 View  07/24/2014   CLINICAL DATA:  Respiratory failure.  Shortness of breath.  EXAM: PORTABLE CHEST - 1 VIEW  COMPARISON:  07/23/2014.  FINDINGS: Interim removal of endotracheal tube and NG tube. Left IJ line in stable position. Persistent cardiomegaly and bilateral pulmonary alveolar infiltrates and pleural effusions noted consistent with persistent congestive heart failure. Lower lobe pneumonia cannot be excluded on either side. The findings are stable from prior exam. No pneumothorax. No acute osseus abnormality.  IMPRESSION: 1. Interim removal of endotracheal tube and NG tube. Left IJ line in stable position. 2. Persistent changes of congestive heart failure with bilateral pulmonary edema and pleural effusions. No change from prior exam. Associated bilateral lower lobe pneumonia cannot be excluded .   Electronically Signed   By: [name]  Register   On: 07/24/2014 07:45     ASSESSMENT / PLAN:  PULMONARY OETT 1/26 >>1/27 A: Acute Hypercarbic / Hypoxemic Respiratory Failure - ddx includes CHF, PNA (PE ruled OUT) Bilateral Pleural Effusions CAP unlikely  Tobacco Abuse Presumed COPD  P:   [place] oxygen as needed to maintain SatO2 >88% Off antibiotics, no evidence of infection  Duoneb + Q3 PRN albuterol Nicotine patch  CARDIOVASCULAR CVL L IJ 1/26 >>  A:  Chronic Systolic CHF - mod LV/RV dysfunction on  ECHO Alcoholic Cardiomyopathy  Mildly elevated trop Atrial fibrillation on EKG, CHAD2Vasc score 1 P:  On Bisoprolol  Consider addition of small dose of an ARB  Diuresing well, 9L down since admission  Will cont with Aspirin is adequate based on his score Would not be a candidate for chronic anticoagulation due to medical noncompliance  D/c CVL  Otherwise per cards  RENAL A:   No acute issues Hypokalemia Hypomagnesemia  Metabolic alkalosis likely 2/2 hypokalemi, unlikely due to diuresis since still appears wet on exam  P:   Aggressive replacement potassium and magnesium Check magnesium and potassium q12hr Check phos level as well Trend BMP Lasix 40 mg BID D/c foley    GASTROINTESTINAL A:   Protein  Calorie Malnutrition - in the setting of ETOH abuse  P:   Toleration diet very well  HEMATOLOGIC A:   Macrocytic Anemia - in setting of ETOH abuse  Thrombocytopenia  P:  Monitor CBC D/c heparin due to low platelets. Cont with SCDs  INFECTIOUS A:   R/O CAP - bibasilar airspace disease on CT (likely atx), effusions P:   BCx2 1/25 >> no growth Off antibiotics   ENDOCRINE A:    No active issues Hypoglycemia resolved  P:   No interventions at this time  NEUROLOGIC A:   Acute Metabolic Encephalopathy - in setting of ETOH abuse / withdrawal  ETOH Abuse  P:   Off precedex gtt PT/OT consulted and recommended SNF placement  Keep Ativan prn Monitor for withdrawal sx  [facility]  FAMILY  - Updates:  Updated his brother Lidia CollumJeff Moskowitz [phone] on 2/27. Patient is not married and does not have children.  Updated a family friend at bedside 1/28.  - Inter-disciplinary family meet or Palliative Care meeting due by:  2/29   Case discussed with Dr Kendrick FriesMcQuaid.  Signed:  Dow Adolphichard Kazibwe, MD PGY-3 Internal Medicine Teaching Service Pager: [phone] 07/25/2014, 7:49 AM   Attending:  I have seen and examined the patient with  nurse practitioner/resident and agree with the note above.   Lungs : diminished bases, Leg Edema: improved  CHF exacerbation> continue diuresis  Transfer to FPTS  Heber CarolinaBrent Siana Panameno, MD [facility] Pager: [phone] Cell: [phone] If no response, call [phone]

## 2014-07-25 NOTE — Progress Notes (Signed)
Spoke with patient about current condition and discharge plan.  Had a prayer with patient.

## 2014-07-26 DIAGNOSIS — I5023 Acute on chronic systolic (congestive) heart failure: Principal | ICD-10-CM

## 2014-07-26 LAB — BASIC METABOLIC PANEL
Anion gap: 12 (ref 5–15)
BUN: 23 mg/dL (ref 6–23)
CALCIUM: 9.1 mg/dL (ref 8.4–10.5)
CO2: 38 mmol/L — ABNORMAL HIGH (ref 19–32)
Chloride: 85 mmol/L — ABNORMAL LOW (ref 96–112)
Creatinine, Ser: 1.13 mg/dL (ref 0.50–1.35)
GFR calc non Af Amer: 67 mL/min — ABNORMAL LOW (ref >90)
GFR, EST AFRICAN AMERICAN: 78 mL/min — AB (ref >90)
GLUCOSE: 83 mg/dL (ref 70–99)
POTASSIUM: 4.7 mmol/L (ref 3.5–5.1)
Sodium: 135 mmol/L (ref 135–145)

## 2014-07-26 LAB — CBC
HCT: 48.1 % (ref 39.0–52.0)
Hemoglobin: 15.7 g/dL (ref 13.0–17.0)
MCH: 35.4 pg — ABNORMAL HIGH (ref 26.0–34.0)
MCHC: 32.6 g/dL (ref 30.0–36.0)
MCV: 108.3 fL — ABNORMAL HIGH (ref 78.0–100.0)
Platelets: 108 10*3/uL — ABNORMAL LOW (ref 150–400)
RBC: 4.44 MIL/uL (ref 4.22–5.81)
RDW: 15 % (ref 11.5–15.5)
WBC: 9.1 10*3/uL (ref 4.0–10.5)

## 2014-07-26 LAB — MAGNESIUM: Magnesium: 1.8 mg/dL (ref 1.5–2.5)

## 2014-07-26 MED ORDER — FUROSEMIDE 40 MG PO TABS
40.0000 mg | ORAL_TABLET | Freq: Every day | ORAL | Status: DC
  Administered 2014-07-27 – 2014-07-31 (×5): 40 mg via ORAL
  Filled 2014-07-26 (×6): qty 1

## 2014-07-26 MED ORDER — LISINOPRIL 5 MG PO TABS
5.0000 mg | ORAL_TABLET | Freq: Every day | ORAL | Status: DC
  Administered 2014-07-26 – 2014-08-01 (×7): 5 mg via ORAL
  Filled 2014-07-26 (×7): qty 1

## 2014-07-26 NOTE — Progress Notes (Signed)
Family Medicine Teaching Service Daily Progress Note Intern Pager: [phone]  Patient name: [name] Medical record number: [id number] Date of birth: Mar 26, 1951 Age: 64 y.o. Gender: male  Primary Care Provider: No primary care provider on file. Consultants: CCM Code Status: Full  Pt Overview and Major Events to Date:  1/25: Admitted with decompensated CHF.  1/26: Underwent EtOH withdrawal and hypercarbic respiratory failure -> ICU 1/27: Self-extubated 1/28: Diuresing (down 9L) 1/29: Transfer to floor on FMTS; transition to po lasix.   Assessment and Plan: 64 yo male admitted for SOB and leg swelling found to have newly diagnosed decompensated CHF resulting in acute hypercarbic respiratory failure.   Acute systolic heart failure, newly diagnosed: EF: 30-35% with diffuse hypokinesis. 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 Appears nearly euvolemic now down ~9L from admission.  - On beta blocker, adding ACE inhibitor, on lasix (now  po daily). Consider spironolactone if remains symptomatic with EF < 35%  - Cardiac catheterization 2/1 per cardiology.  - Transition lasix IV -> PO - Daily weights, strict I/O  Demand cardiac ischemia: Mild troponin elevation with plateau due to CHF, arrhythmia, respiratory failure.  - No longer obtaining troponins - ASA  Acute respiratory failure: Improving with continued hypoxia. Extubated 1/27. PE ruled out, CAP unlikely given improvement off abx.  - Wean oxygen by [place] as able (5L this AM); if unable to wean, would re-CXR - Incentive spirometry - Ambulate with assistance - Monitor for signs of CAP (off abx)  - Blood Cx (1/25- ) NGTD  Presumed COPD: Longstanding tobacco use and wheezing on exam.  - Albuterol q3h prn - Nicotine patch - Agree with highly cardioselective beta blocker  Atrial fibrillation: CHAD2VASc: 1.  - Continue ASA (both for CHAD2VASc score and noncompliance limiting anticoagulation  candidacy).  - Telemetry  Alcohol abuse: With evidence of withdrawal on admission. 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 Supported by macrocytosis, thrombocytopenia. INR: 1.35. Bili 2.0. Albumin 3.3 - Continue CIWA protocol - Thiamine, folate  Protein-calorie malnutrition: Tolerating diet.  - Nutrition consult  Thrombocytopenia: Likely chronic due to alcoholism. Holding heparin.  - SCDs  - Continue to monitor  Hypokalemia: Medication-induced most likely, resolved with KDUR BID - Continue to monitor  FEN/GI: Heart healthy, Saline lock IV PPx: SCDs, pepcid  Disposition: PT/OT recommending SNF; SW consulted; continue diuresis awaiting catheterization 2/1.   Subjective:  Pt reporting feeling "great," "much better today." Denies CP, SOB, palpitations. Says he hasn't drunk alcohol for many years.   Objective: Temp:  [97.8 F (36.6 C)-98.6 F (37 C)] 98.6 F (37 C) (01/30 0611) Pulse Rate:  [56-102] 97 (01/30 0611) Resp:  [18-26] 18 (01/30 0611) BP: (109-134)/(69-89) 121/89 mmHg (01/30 0611) SpO2:  [94 %-100 %] 94 % (01/30 0611) Weight:  [178 lb 6.3 oz (80.92 kg)-179 lb 3.7 oz (81.3 kg)] 178 lb 6.3 oz (80.92 kg) (01/30 1478) Physical Exam: General: Alert, interactive chronically ill-appearing man sitting in no distress Cardiovascular: Distant but regular rate without murmurs. No JVD noted.  Respiratory: No respiratory distress, distant breath sounds but clear with good air movement Abdomen: +BS, soft, NT Extremities: Trace pitting LE edema without tenderness. No cyanosis or clubbing.   Laboratory:  Recent Labs Lab 07/23/14 0535 07/25/14 0415 07/26/14 0553  WBC 6.4 8.4 PENDING  HGB 16.1 15.2 15.7  HCT 48.2 45.9 48.1  PLT 88* 82* 108*    Recent Labs Lab 07/21/14 2038  07/23/14 0535  07/24/14 0937 07/25/14 0415 07/25/14 1606  NA  138  < > 139  < > 141 139 138  K 4.0  < > 2.5*  < > 3.3* 2.9* 4.3  CL 95*  < > 91*  < > 93* 85* 86*  CO2  32  < > 34*  < > 39* 46* 45*  BUN 42*  < > 28*  < > 19 17 18   CREATININE 1.31  < > 1.34  < > 1.09 1.07 1.24  CALCIUM 9.3  < > 8.7  < > 8.8 8.9 9.1  PROT 7.8  --  5.5*  --   --   --   --   BILITOT 2.0*  --  2.5*  --   --   --   --   ALKPHOS 78  --  53  --   --   --   --   ALT 30  --  20  --   --   --   --   AST 51*  --  35  --   --   --   --   GLUCOSE 106*  < > 106*  < > 98 93 122*  < > = values in this interval not displayed.  Imaging/Diagnostic Tests: ECG: NSR, trifascicular block  Tyrone Nineyan B Ihsan Nomura, MD 07/26/2014, 9:05 AM PGY-2, [facility] FPTS Intern pager: [phone], text pages welcome

## 2014-07-26 NOTE — Progress Notes (Signed)
Patient Name: [name] Date of Encounter: 07/26/2014  Active Problems:   CHF (congestive heart failure)   CHF exacerbation   Acute respiratory failure with hypoxia   Alcohol withdrawal   CAP (community acquired pneumonia)   Acute respiratory failure   Dyspnea   Atrial fibrillation   Length of Stay: 5  SUBJECTIVE  Agreeable and pleasant today, but his thought processes seem a little flighty and disjointed. Sometimes appears to refer to "a friend" when describing himself. >9L net diuresis since admission.  CURRENT MEDS . aspirin  81 mg Oral Daily  . bisoprolol  5 mg Oral Daily  . famotidine  20 mg Oral BID  . folic acid  1 mg Oral Daily  . furosemide  40 mg Intravenous BID  . multivitamin with minerals  1 tablet Oral Daily  . nicotine  21 mg Transdermal Daily  . potassium chloride  20 mEq Oral BID  . sodium chloride  3 mL Intravenous Q12H  . thiamine  100 mg Oral Daily    OBJECTIVE   Intake/Output Summary (Last 24 hours) at 07/26/14 0954 Last data filed at 07/26/14 0849  Gross per 24 hour  Intake    920 ml  Output   1000 ml  Net    -80 ml   Filed Weights   07/24/14 0435 07/25/14 1255 07/26/14 0611  Weight: 183 lb 13.8 oz (83.4 kg) 179 lb 3.7 oz (81.3 kg) 178 lb 6.3 oz (80.92 kg)    PHYSICAL EXAM Filed Vitals:   07/25/14 1255 07/25/14 2156 07/26/14 0208 07/26/14 0611  BP: 115/79 111/69 109/72 121/89  Pulse: 96 56 59 97  Temp: 97.8 F (36.6 C) 98.1 F (36.7 C) 98.4 F (36.9 C) 98.6 F (37 C)  TempSrc: Oral Oral Oral Oral  Resp: Height: 6' (1.829 m)     Weight: 179 lb 3.7 oz (81.3 kg)   178 lb 6.3 oz (80.92 kg)  SpO2: 100% 94% 98% 94%   General: Alert, oriented x3, no distress Head: no evidence of trauma, PERRL, EOMI, no exophtalmos or lid lag, no myxedema, no xanthelasma; normal ears, nose and oropharynx Neck: normal jugular venous pulsations and no hepatojugular reflux; brisk carotid pulses without delay and no carotid bruits Chest:  diminished breath sounds throughout, no signs of consolidation by percussion or palpation, normal fremitus, symmetrical and full respiratory excursions Cardiovascular: normal position and quality of the apical impulse, regular rhythm, distant first and (widely split) second heart sounds, no rubs or gallops, no murmur Abdomen: no tenderness or distention, no masses by palpation, no abnormal pulsatility or arterial bruits, normal bowel sounds, no hepatosplenomegaly Extremities: no clubbing, cyanosis or edema; 2+ radial, ulnar and brachial pulses bilaterally; 2+ right femoral, posterior tibial and dorsalis pedis pulses; 2+ left femoral, posterior tibial and dorsalis pedis pulses; no subclavian or femoral bruits Neurological: grossly nonfocal  LABS  CBC  Recent Labs  07/25/14 0415 07/26/14 0553  WBC 8.4 9.1  HGB 15.2 15.7  HCT 45.9 48.1  MCV 104.3* 108.3*  PLT 82* 108*   Basic Metabolic Panel  Recent Labs  07/23/14 2243  07/24/14 0937 07/25/14 0415 07/25/14 1606 07/26/14 0553  NA 140  < > 141 139 138 135  K 2.9*  < > 3.3* 2.9* 4.3 4.7  CL 90*  < > 93* 85* 86* 85*  CO2 43*  < > 39* 46* 45* 38*  GLUCOSE 106*  < > 98 93 122* 83  BUN 23  < >  19 17 18 23   CREATININE 1.17  < > 1.09 1.07 1.24 1.13  CALCIUM 8.9  < > 8.8 8.9 9.1 9.1  MG 2.0  --  1.8 1.6 1.6  --   PHOS 4.0  --  4.6  --   --   --   < > = values in this interval not displayed.  Radiology Studies Imaging results have been reviewed and No results found.  TELE NSR  ECG NSR, RBBB, LAFB  ASSESSMENT AND PLAN   He has newly recognized moderate to severe cardiomyopathy and risk factors for CAD. No angina. Some coronary calcification on CTA chest. Severe calcific atherosclerosis in the aortic arch and its cervical branches. While alcoholic cardiomyopathy is definitely a possible diagnosis, CAD would be the statistically most likely cause of his CHF. Recommend coronary angiography on Monday. He is agreeable to it today,  but I have serious misgivings about his memory and comprehension. He may be confabulating. Add ACE inhibitor. Switch to PO diuretics.  Thurmon FairMihai Rae Sutcliffe, MD, [facility] [phone] office [phone] pager 07/26/2014 9:54 AM

## 2014-07-27 DIAGNOSIS — R0602 Shortness of breath: Secondary | ICD-10-CM | POA: Insufficient documentation

## 2014-07-27 DIAGNOSIS — I5041 Acute combined systolic (congestive) and diastolic (congestive) heart failure: Secondary | ICD-10-CM

## 2014-07-27 DIAGNOSIS — I2584 Coronary atherosclerosis due to calcified coronary lesion: Secondary | ICD-10-CM

## 2014-07-27 DIAGNOSIS — I2510 Atherosclerotic heart disease of native coronary artery without angina pectoris: Secondary | ICD-10-CM

## 2014-07-27 LAB — BASIC METABOLIC PANEL
ANION GAP: 4 — AB (ref 5–15)
BUN: 24 mg/dL — ABNORMAL HIGH (ref 6–23)
CHLORIDE: 88 mmol/L — AB (ref 96–112)
CO2: 45 mmol/L — AB (ref 19–32)
Calcium: 9 mg/dL (ref 8.4–10.5)
Creatinine, Ser: 1.12 mg/dL (ref 0.50–1.35)
GFR, EST AFRICAN AMERICAN: 78 mL/min — AB (ref >90)
GFR, EST NON AFRICAN AMERICAN: 68 mL/min — AB (ref >90)
GLUCOSE: 96 mg/dL (ref 70–99)
POTASSIUM: 3.8 mmol/L (ref 3.5–5.1)
Sodium: 137 mmol/L (ref 135–145)

## 2014-07-27 LAB — CBC
HCT: 48.2 % (ref 39.0–52.0)
Hemoglobin: 15.3 g/dL (ref 13.0–17.0)
MCH: 34.4 pg — ABNORMAL HIGH (ref 26.0–34.0)
MCHC: 31.7 g/dL (ref 30.0–36.0)
MCV: 108.3 fL — AB (ref 78.0–100.0)
PLATELETS: 85 10*3/uL — AB (ref 150–400)
RBC: 4.45 MIL/uL (ref 4.22–5.81)
RDW: 15 % (ref 11.5–15.5)
WBC: 8.5 10*3/uL (ref 4.0–10.5)

## 2014-07-27 MED ORDER — SODIUM CHLORIDE 0.9 % IJ SOLN: 3.0000 mL | INTRAMUSCULAR | Status: DC | PRN

## 2014-07-27 MED ORDER — SODIUM CHLORIDE 0.9 % IJ SOLN
3.0000 mL | Freq: Two times a day (BID) | INTRAMUSCULAR | Status: DC
  Administered 2014-07-27 – 2014-08-03 (×13): 3 mL via INTRAVENOUS

## 2014-07-27 MED ORDER — SODIUM CHLORIDE 0.9 % IV SOLN: 250.0000 mL | INTRAVENOUS | Status: DC | PRN

## 2014-07-27 MED ORDER — SODIUM CHLORIDE 0.9 % IV SOLN
INTRAVENOUS | Status: DC
  Administered 2014-07-28: 08:00:00 via INTRAVENOUS

## 2014-07-27 MED ORDER — ASPIRIN 81 MG PO CHEW
81.0000 mg | CHEWABLE_TABLET | ORAL | Status: AC
  Administered 2014-07-28: 81 mg via ORAL
  Filled 2014-07-27: qty 1

## 2014-07-27 NOTE — Progress Notes (Signed)
Pt having periods of confusion. Brother Publishing copyJeff at bedside. Cath d/w pt and brother. Pt aware that [name] has signed consent and agrees with procedure.

## 2014-07-27 NOTE — Progress Notes (Signed)
Patient Name: [name] [name] Date of Encounter: 07/27/2014  Active Problems:   CHF (congestive heart failure)   CHF exacerbation   Acute respiratory failure with hypoxia   Alcohol withdrawal   CAP (community acquired pneumonia)   Acute respiratory failure   Dyspnea   Atrial fibrillation   Length of Stay: 6  SUBJECTIVE  He says he feels great and is very reluctant to undergo cardiac catheterization. He wants to wait he 9 or 10 weeks till he is eligible for Medicare. I pointed out that it's actually closer to 5 months and that I recommend he have the procedure done now.  CURRENT MEDS . aspirin  81 mg Oral Daily  . bisoprolol  5 mg Oral Daily  . famotidine  20 mg Oral BID  . folic acid  1 mg Oral Daily  . furosemide  40 mg Oral Daily  . lisinopril  5 mg Oral Daily  . multivitamin with minerals  1 tablet Oral Daily  . nicotine  21 mg Transdermal Daily  . potassium chloride  20 mEq Oral BID  . sodium chloride  3 mL Intravenous Q12H  . thiamine  100 mg Oral Daily    OBJECTIVE   Intake/Output Summary (Last 24 hours) at 07/27/14 0820 Last data filed at 07/27/14 0802  Gross per 24 hour  Intake    600 ml  Output    375 ml  Net    225 ml   Filed Weights   07/25/14 1255 07/26/14 0611 07/27/14 0716  Weight: 179 lb 3.7 oz (81.3 kg) 178 lb 6.3 oz (80.92 kg) 178 lb (80.74 kg)    PHYSICAL EXAM Filed Vitals:   07/26/14 1800 07/26/14 2000 07/26/14 2115 07/27/14 0716  BP: 106/80  109/76 118/69  Pulse: 97  91 94  Temp: 97.6 F (36.4 C)  97.4 F (36.3 C) 97.6 F (36.4 C)  TempSrc: Oral  Oral Oral  Resp:  20 20 20   Height:      Weight:    178 lb (80.74 kg)  SpO2: 93%  95% 99%   General: Alert, oriented x3, no distress Head: no evidence of trauma, PERRL, EOMI, no exophtalmos or lid lag, no myxedema, no xanthelasma; normal ears, nose and oropharynx Neck: normal jugular venous pulsations and no hepatojugular reflux; brisk carotid pulses without delay and no carotid bruits Chest:  diminished breath sounds throughout, no signs of consolidation by percussion or palpation, normal fremitus, symmetrical and full respiratory excursions Cardiovascular: normal position and quality of the apical impulse, regular rhythm, distant first and (widely split) second heart sounds, no rubs or gallops, no murmur Abdomen: no tenderness or distention, no masses by palpation, no abnormal pulsatility or arterial bruits, normal bowel sounds, no hepatosplenomegaly Extremities: no clubbing, cyanosis or edema; 2+ radial, ulnar and brachial pulses bilaterally; 2+ right femoral, posterior tibial and dorsalis pedis pulses; 2+ left femoral, posterior tibial and dorsalis pedis pulses; no subclavian or femoral bruits Neurological: grossly nonfocal  LABS  CBC  Recent Labs  07/26/14 0553 07/27/14 0306  WBC 9.1 8.5  HGB 15.7 15.3  HCT 48.1 48.2  MCV 108.3* 108.3*  PLT 108* 85*   Basic Metabolic Panel  Recent Labs  07/24/14 0937  07/25/14 1606 07/26/14 0553 07/27/14 0306  NA 141  < > 138 135 137  K 3.3*  < > 4.3 4.7 3.8  CL 93*  < > 86* 85* 88*  CO2 39*  < > 45* 38* 45*  GLUCOSE 98  < > 122*  83 96  BUN 19  < > 18 23 24*  CREATININE 1.09  < > 1.24 1.13 1.12  CALCIUM 8.8  < > 9.1 9.1 9.0  MG 1.8  < > 1.6 1.8  --   PHOS 4.6  --   --   --   --   < > = values in this interval not displayed.  Radiology Studies Imaging results have been reviewed and No results found.  TELE NSR   ASSESSMENT AND PLAN As far as I can tell he is now euvolemic. Tolerating current doses of ACE inhibitor and beta blocker, which should be gradually titrated as an outpatient. I told him I would go ahead and put him on the schedule for a cardiac cath tomorrow, while he thinks about his options. I strongly encouraged not to delay the procedure for 5 months. Alcohol use should be prohibited until we clarify the cause of his cardiomyopathy. Obviously, it would be beneficial if he quits drinking permanently since he  may already have some neurological sequelae of alcoholism.   Thurmon Fair, MD, [facility] [phone] office [phone] pager 07/27/2014 8:20 AM

## 2014-07-27 NOTE — Progress Notes (Signed)
Family Medicine Teaching Service Daily Progress Note Intern Pager: [phone]  Patient name: [name] [name] Medical record number: [id number] Date of birth: [birth year] Age: 64 y.o. Gender: male  Primary Care Provider: No primary care provider on file. Consultants: CCM (1/26-1/29) Code Status: Full  Pt Overview and Major Events to Date:  1/25: Admitted with decompensated CHF.  1/26: Underwent EtOH withdrawal and hypercarbic respiratory failure -> ICU 1/27: Self-extubated 1/28: Diuresing (down 9L) 1/29: Transfer to floor on FMTS; transition to po lasix.  1/30 & 1/31: stable respiratory status, CIWA scores 0  Assessment and Plan: 64 yo male admitted for SOB and leg swelling found to have newly diagnosed decompensated CHF resulting in acute hypercarbic respiratory failure.   Acute systolic heart failure, newly diagnosed: EF: 30-35% with diffuse hypokinesis. 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 Appears nearly euvolemic now down ~9L from admission.  - On beta blocker, adding ACE inhibitor, on lasix (now 40mg  po daily). Consider spironolactone if remains symptomatic with EF < 35%  - Cardiac catheterization 2/1 per cardiology, appreciate assistance in management of this pt.  - Lasix 40mg  PO daily - Daily weights, strict I/O  Demand cardiac ischemia: Mild troponin elevation with plateau due to CHF, arrhythmia, respiratory failure.  - No longer obtaining troponins - ASA  Acute respiratory failure: Improving with continued hypoxia. Extubated 1/27. PE ruled out, CAP unlikely given improvement off abx.  - Wean oxygen by [place] as able (5L this AM); if unable to wean, would re-CXR - Incentive spirometry - Ambulate with assistance - Monitor for signs of CAP (off abx)  - Blood Cx (1/25- ) NGTD  COPD: Longstanding tobacco use and wheezing on exam. Appears to have baseline hypercarbia due to COPD, bicarb 45 1/29 and 1/31.  - Albuterol q3h prn - Nicotine  patch  Atrial fibrillation: CHAD2VASc: 1. Poor anticoagulation candidate given non-adherence and alcohol abuse - Continue ASA - Telemetry  Alcohol abuse: With evidence of withdrawal on admission. 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 Supported by macrocytosis, thrombocytopenia. INR: 1.35. Bili 2.0. Albumin 3.3 - Continue CIWA protocol - Thiamine, folate  Protein-calorie malnutrition: Tolerating diet.  - Nutrition consult  Thrombocytopenia: Likely chronic due to alcoholism. Holding heparin. Plts 108>>85 - SCDs  - Continue to monitor  Hypokalemia: Medication-induced most likely, resolved with KDUR 20mEq BID - Continue to monitor  FEN/GI: Heart healthy, Saline lock IV PPx: SCDs, pepcid  Disposition: PT/OT recommending SNF; SW consulted; continue diuresis awaiting catheterization 2/1.   Subjective:  States he is doing okay today, no complaints.  Objective: Temp:  [97.4 F (36.3 C)-97.6 F (36.4 C)] 97.6 F (36.4 C) (01/31 0716) Pulse Rate:  [91-97] 94 (01/31 0716) Resp:  [18-20] 20 (01/31 0716) BP: (103-118)/(61-80) 118/69 mmHg (01/31 0716) SpO2:  [93 %-99 %] 99 % (01/31 0716) Weight:  [178 lb (80.74 kg)] 178 lb (80.74 kg) (01/31 0716) Physical Exam: General: Alert, interactive chronically ill-appearing man laying on side in bed Cardiovascular: RRR, distant heart sounds. No m/r/g Respiratory: No respiratory distress, distant breath sounds but clear with good air movement Abdomen: +BS, soft, NT Extremities: 1+ pitting LE edema (feet) without tenderness. No cyanosis or clubbing.  Neuro: alert x 2 (person/time), [place] and [place] is president. No asterixis, finger to nose intact. No other deficits noted.  Laboratory:  Recent Labs Lab 07/25/14 0415 07/26/14 0553 07/27/14 0306  WBC 8.4 9.1 8.5  HGB 15.2 15.7 15.3  HCT 45.9 48.1 48.2  PLT 82* 108* 85*  Recent Labs Lab 07/21/14 2038  07/23/14 0535  07/25/14 1606  07/26/14 0553 07/27/14 0306  NA 138  < > 139  < > 138 135 137  K 4.0  < > 2.5*  < > 4.3 4.7 3.8  CL 95*  < > 91*  < > 86* 85* 88*  CO2 32  < > 34*  < > 45* 38* 45*  BUN 42*  < > 28*  < > 18 23 24*  CREATININE 1.31  < > 1.34  < > 1.24 1.13 1.12  CALCIUM 9.3  < > 8.7  < > 9.1 9.1 9.0  PROT 7.8  --  5.5*  --   --   --   --   BILITOT 2.0*  --  2.5*  --   --   --   --   ALKPHOS 78  --  53  --   --   --   --   ALT 30  --  20  --   --   --   --   AST 51*  --  35  --   --   --   --   GLUCOSE 106*  < > 106*  < > 122* 83 96  < > = values in this interval not displayed.  Imaging/Diagnostic Tests: ECG 1/30: NSR, bifasicular block  Nani Ravens, MD 07/27/2014, 8:40 AM PGY-2, [facility] FPTS Intern pager: [phone], text pages welcome

## 2014-07-27 NOTE — Progress Notes (Addendum)
CRITICAL VALUE ALERT  Critical value received:    Date of notification:  07/27/2014  Time of notification:  457 Critical value read back:yes  Nurse who received alert: Susie CassetteJoy Afshin Chrystal RN MD notified (1st page): Tresa EndoKelly MD  Time of first page:  [phone]    MD notified (2nd page):[phone] Jarvis NewcomerGrunz  Time of second page:  Responding MD:    Time MD responded:

## 2014-07-27 NOTE — Progress Notes (Signed)
Pt with no urine output charted since this AM. Pt bed and chair had urine from incontinent episodes on pads. Pt unable to tell RN when he last voided. Pt states he does not need to void at this time. Will continue to monitor. Huel Coventryosenberger, Alaynah Schutter A, RN

## 2014-07-28 ENCOUNTER — Encounter (HOSPITAL_COMMUNITY)
Admission: EM | Disposition: A | Discharge status: Home Health | Payer: Self-pay | Source: Home / Self Care | Attending: Family Medicine

## 2014-07-28 ENCOUNTER — Inpatient Hospital Stay (HOSPITAL_COMMUNITY): Payer: Self-pay

## 2014-07-28 ENCOUNTER — Encounter (HOSPITAL_COMMUNITY): Payer: Self-pay | Admitting: Interventional Cardiology

## 2014-07-28 DIAGNOSIS — I420 Dilated cardiomyopathy: Secondary | ICD-10-CM

## 2014-07-28 DIAGNOSIS — R0602 Shortness of breath: Secondary | ICD-10-CM | POA: Insufficient documentation

## 2014-07-28 HISTORY — PX: LEFT HEART CATHETERIZATION WITH CORONARY ANGIOGRAM: SHX5451

## 2014-07-28 LAB — BASIC METABOLIC PANEL
Anion gap: 6 (ref 5–15)
BUN: 28 mg/dL — ABNORMAL HIGH (ref 6–23)
CHLORIDE: 91 mmol/L — AB (ref 96–112)
CO2: 39 mmol/L — ABNORMAL HIGH (ref 19–32)
CREATININE: 0.98 mg/dL (ref 0.50–1.35)
Calcium: 8.9 mg/dL (ref 8.4–10.5)
GFR calc Af Amer: >90 mL/min (ref >90)
GFR, EST NON AFRICAN AMERICAN: 85 mL/min — AB (ref >90)
Glucose, Bld: 99 mg/dL (ref 70–99)
POTASSIUM: 3.9 mmol/L (ref 3.5–5.1)
Sodium: 136 mmol/L (ref 135–145)

## 2014-07-28 LAB — CULTURE, BLOOD (ROUTINE X 2)
CULTURE: NO GROWTH
CULTURE: NO GROWTH

## 2014-07-28 LAB — CBC
HCT: 45.4 % (ref 39.0–52.0)
HEMOGLOBIN: 14.3 g/dL (ref 13.0–17.0)
MCH: 34.3 pg — ABNORMAL HIGH (ref 26.0–34.0)
MCHC: 31.5 g/dL (ref 30.0–36.0)
MCV: 108.9 fL — AB (ref 78.0–100.0)
PLATELETS: 87 10*3/uL — AB (ref 150–400)
RBC: 4.17 MIL/uL — AB (ref 4.22–5.81)
RDW: 15 % (ref 11.5–15.5)
WBC: 8.5 10*3/uL (ref 4.0–10.5)

## 2014-07-28 LAB — PROTIME-INR
INR: 1.05 (ref 0.00–1.49)
Prothrombin Time: 13.8 seconds (ref 11.6–15.2)

## 2014-07-28 SURGERY — LEFT HEART CATHETERIZATION WITH CORONARY ANGIOGRAM
Anesthesia: LOCAL

## 2014-07-28 MED ORDER — HEPARIN SODIUM (PORCINE) 1000 UNIT/ML IJ SOLN
INTRAMUSCULAR | Status: AC
  Filled 2014-07-28: qty 1

## 2014-07-28 MED ORDER — NITROGLYCERIN 1 MG/10 ML FOR IR/CATH LAB
INTRA_ARTERIAL | Status: AC
  Filled 2014-07-28: qty 10

## 2014-07-28 MED ORDER — ENSURE COMPLETE PO LIQD
237.0000 mL | ORAL | Status: DC
  Administered 2014-07-28 – 2014-08-03 (×5): 237 mL via ORAL

## 2014-07-28 MED ORDER — PRO-STAT SUGAR FREE PO LIQD
30.0000 mL | ORAL | Status: DC
  Administered 2014-07-29 – 2014-08-02 (×5): 30 mL via ORAL
  Filled 2014-07-28 (×8): qty 30

## 2014-07-28 MED ORDER — HEPARIN (PORCINE) IN NACL 2-0.9 UNIT/ML-% IJ SOLN
INTRAMUSCULAR | Status: AC
  Filled 2014-07-28: qty 1500

## 2014-07-28 MED ORDER — VERAPAMIL HCL 2.5 MG/ML IV SOLN
INTRAVENOUS | Status: AC
  Filled 2014-07-28: qty 2

## 2014-07-28 MED ORDER — LIDOCAINE HCL (PF) 1 % IJ SOLN
INTRAMUSCULAR | Status: AC
  Filled 2014-07-28: qty 30

## 2014-07-28 NOTE — Progress Notes (Signed)
The patient was brought to the Cath Lab for diagnostic angiography. Upon discussing the procedure with the patient and obtaining history, it became clear that the patient is very confused and unable to give informed consent. For that reason the procedure has been canceled and should not be performed until the patient is in a better cognitive state. We are unable to find any family or anyone to vouch for him and felt that it was better to postpone the procedure until he can be more actively involved in decision making.

## 2014-07-28 NOTE — H&P (View-Only) (Signed)
SUBJECTIVE:  No new complaints  OBJECTIVE:   Vitals:   Filed Vitals:   07/27/14 1427 07/27/14 2044 07/28/14 0443 07/28/14 0449  BP: 102/63 105/63 99/56 110/68  Pulse: 83 88 85   Temp: 97.5 F (36.4 C) 97.8 F (36.6 C) 97.5 F (36.4 C)   TempSrc: Oral Oral Oral   Resp: Height:      Weight:   179 lb 3.7 oz (81.3 kg)   SpO2: 97% 93% 96% 96%   I&O's:   Intake/Output Summary (Last 24 hours) at 07/28/14 0932 Last data filed at 07/28/14 0802  Gross per 24 hour  Intake    720 ml  Output    650 ml  Net     70 ml   TELEMETRY: Reviewed telemetry pt in NSR:     PHYSICAL EXAM General: Well developed, well nourished, in no acute distress Head:   Normal cephalic and atramatic  Lungs:   Clear bilaterally to auscultation. Heart:   HRRR S1 S2  No JVD.   Abdomen: abdomen soft and non-tender Msk:  Back normal,  Normal strength and tone for age. Extremities:   No edema.  erythema Neuro: Alert and oriented. Psych:  Normal affect, responds appropriately at times; at other times, asks strange questions Skin: No rash   LABS: Basic Metabolic Panel:  Recent Labs  16/10/96 1606 07/26/14 0553 07/27/14 0306 07/28/14 0617  NA 138 135 137 136  K 4.3 4.7 3.8 3.9  CL 86* 85* 88* 91*  CO2 45* 38* 45* 39*  GLUCOSE 122* 83 96 99  BUN 18 23 24* 28*  CREATININE 1.24 1.13 1.12 0.98  CALCIUM 9.1 9.1 9.0 8.9  MG 1.6 1.8  --   --    Liver Function Tests: No results for input(s): AST, ALT, ALKPHOS, BILITOT, PROT, ALBUMIN in the last 72 hours. No results for input(s): LIPASE, AMYLASE in the last 72 hours. CBC:  Recent Labs  07/27/14 0306 07/28/14 0617  WBC 8.5 8.5  HGB 15.3 14.3  HCT 48.2 45.4  MCV 108.3* 108.9*  PLT 85* 87*   Cardiac Enzymes: No results for input(s): CKTOTAL, CKMB, CKMBINDEX, TROPONINI in the last 72 hours. BNP: Invalid input(s): POCBNP D-Dimer: No results for input(s): DDIMER in the last 72 hours. Hemoglobin A1C: No results for input(s):  HGBA1C in the last 72 hours. Fasting Lipid Panel: No results for input(s): CHOL, HDL, LDLCALC, TRIG, CHOLHDL, LDLDIRECT in the last 72 hours. Thyroid Function Tests: No results for input(s): TSH, T4TOTAL, T3FREE, THYROIDAB in the last 72 hours.  Invalid input(s): FREET3 Anemia Panel: No results for input(s): VITAMINB12, FOLATE, FERRITIN, TIBC, IRON, RETICCTPCT in the last 72 hours. Coag Panel:   Lab Results  Component Value Date   INR 1.05 07/28/2014   INR 1.35 07/21/2014   INR 0.9 08/20/2007    RADIOLOGY: Ct Angio Chest Pe W/cm &/or Wo Cm  07/22/2014   CLINICAL DATA:  Progressive dyspnea, swelling, and lethargy for 2 weeks. Initial symptoms 2 months ago.  EXAM: CT ANGIOGRAPHY CHEST WITH CONTRAST  TECHNIQUE: Multidetector CT imaging of the chest was performed using the standard protocol during bolus administration of intravenous contrast. Multiplanar CT image reconstructions and MIPs were obtained to evaluate the vascular anatomy.  CONTRAST:  80mL OMNIPAQUE IOHEXOL 350 MG/ML SOLN  COMPARISON:  None.  FINDINGS: Technically adequate study with good opacification of the central and segmental pulmonary arteries. No focal filling defects demonstrated. No evidence of significant pulmonary embolus.  Cardiac  enlargement. Normal caliber thoracic aorta with calcification. Esophagus is decompressed. No significant lymphadenopathy in the chest.  Bilateral pleural effusions with basilar atelectasis or consolidation, greater on the right. Emphysematous changes throughout the lungs. No pneumothorax.  Included portions of the upper abdominal organs demonstrate diffuse abdominal ascites. Reflux of contrast material into the hepatic veins is consistent with passive congestion. Degenerative changes in the spine. No destructive bone lesions.  Review of the MIP images confirms the above findings.  IMPRESSION: No evidence of significant pulmonary embolus. Cardiac enlargement with passive congestion of the liver.  Bilateral pleural effusions and basilar atelectasis or consolidation, greater on the right. Findings suggest congestive heart failure. Diffuse abdominal ascites.   Electronically Signed   By: Burman NievesWilliam  Stevens M.D.   On: 07/22/2014 04:03   Dg Chest Port 1 View  07/24/2014   CLINICAL DATA:  Respiratory failure.  Shortness of breath.  EXAM: PORTABLE CHEST - 1 VIEW  COMPARISON:  07/23/2014.  FINDINGS: Interim removal of endotracheal tube and NG tube. Left IJ line in stable position. Persistent cardiomegaly and bilateral pulmonary alveolar infiltrates and pleural effusions noted consistent with persistent congestive heart failure. Lower lobe pneumonia cannot be excluded on either side. The findings are stable from prior exam. No pneumothorax. No acute osseus abnormality.  IMPRESSION: 1. Interim removal of endotracheal tube and NG tube. Left IJ line in stable position. 2. Persistent changes of congestive heart failure with bilateral pulmonary edema and pleural effusions. No change from prior exam. Associated bilateral lower lobe pneumonia cannot be excluded .   Electronically Signed   By: Maisie Fushomas  Register   On: 07/24/2014 07:45   Dg Chest Port 1 View  07/23/2014   CLINICAL DATA:  Respiratory failure  EXAM: PORTABLE CHEST - 1 VIEW  COMPARISON:  Portable chest x-ray of July 22, 2014  FINDINGS: There is a persistent moderate-sized right pleural effusion. The right hemidiaphragm remains obscured. On the left the lung is better aerated and the left heart border better demonstrated. The cardiac silhouette remains enlarged. The pulmonary vascularity is not engorged.  The endotracheal tube tip lies 4.5 cm above the crotch of the carina. The esophagogastric tube tip projects below the inferior margin of the image. The left internal jugular venous catheter tip projects over the midportion of the SVC.  IMPRESSION: Allowing for differences in positioning there has not been dramatic interval change. There remains CHF with a  small to moderate sized right pleural effusion. Left lower lobe atelectasis or pneumonia is noted but may have improved somewhat.   Electronically Signed   By: David  [place]azil[place]Jordan   On: 07/23/2014 07:44   Dg Chest Port 1 View  07/22/2014   CLINICAL DATA:  LEFT jugular line, intubation, abdominal distension, acute respiratory failure, smoker, COPD, GERD, CHF, asthma  EXAM: PORTABLE CHEST - 1 VIEW  COMPARISON:  Portable exam 1147 hr compared to 07/21/2014  FINDINGS: Tip of endotracheal tube projects approximately 6.1 cm above carina.  Nasogastric tube extends into abdomen.  LEFT jugular central venous catheter tip projects over SVC.  Enlargement of cardiac silhouette with pulmonary vascular congestion.  Perihilar infiltrates bilaterally with RIGHT pleural effusion identified, favor pulmonary edema.  Atherosclerotic calcification aorta.  No pneumothorax or acute osseous findings.  IMPRESSION: No pneumothorax following central line placement.  Probable CHF with small RIGHT pleural effusion.   Electronically Signed   By: Ulyses SouthwardMark  Boles M.D.   On: 07/22/2014 12:18   Dg Chest Port 1 View  07/21/2014   CLINICAL DATA:  64 year old  male with respiratory distress and shortness of breath for 1 day. Initial encounter.  EXAM: PORTABLE CHEST - 1 VIEW  COMPARISON:  08/19/2007.  FINDINGS: Portable AP view at 2043 hrs. New patchy in veiling opacity at both lung bases. Increased pulmonary vascularity. No pneumothorax. No definite consolidation. Calcified atherosclerosis of the aorta. Normal cardiac size and mediastinal contours. Visualized tracheal air column is within normal limits.  IMPRESSION: Increased pulmonary vascularity with patchy and veiling opacity at both lung bases.  Top differential considerations include pulmonary interstitial edema with pleural effusions and bibasilar pneumonia.   Electronically Signed   By: Augusto Gamble M.D.   On: 07/21/2014 21:26      ASSESSMENT: [name] [name]:    Presumed alcoholic  vs CAD as a cause of  cardiomyopathy: Plan for cath today.  Appears euvolemic.  Although his questions are somewhat bizarre,(such as "Can I clean my car?"...before the cath), he recalls the conversation he had with Dr. Salena Saner about checking for blockages with a cath.  He had no further questions about the test and can lie flat without difficulty.  I had him do this in the room and he felt fine.   Continue medical therapy for cardiomyopathy, including diuretic, beta blocker, ACE-I and avoidance of alcohol.   [name] Crafts, MD  07/28/2014  9:32 AM

## 2014-07-28 NOTE — Progress Notes (Signed)
NUTRITION FOLLOW UP/CONSULT  Intervention:   Provide Ensure Complete once daily, provides 350 kcal and 13 grams of protein Provide Prot-stat once daily, provides 100 kcal and 15 grams of protein Encourage PO intake with protein-rich foods  Nutrition Dx:   Predicted sub optimal protein intake related to food preferences as evidenced by estimated protein needs and pt's report  Goal:   Pt to meet >/= 90% of their estimated nutrition needs   Monitor:   PO intake, weight trend, labs  Assessment:   64 y.o. male presenting with edema and dyspnea.  RD consulted for nutrition assessment; per MD note, pt with protein-calorie malnutrition.  Pt self-extubated 1/27 and tube feedings were discontinued. Pt was advanced to full liquids on 1/28 and Heart Healthy diet on 1/29. Per nursing notes, pt has been consuming 100% of meals the past 2 days. Pt reports his appetite is good and he is eating well. He states that his usual weight is 184 lbs and he thinks he has lost a few pounds due to eating poorly for 1 week PTA. He states he usually eats a couple cereal bars for breakfast and pasta and salad for dinner. RD reviewed protein-rich foods and encouraged pt to aim for protein-rich foods at each meal.   Labs reviewed.   Nutrition Focused Physical Exam:  Subcutaneous Fat:  Orbital Region: wnl Upper Arm Region: mild wasting Thoracic and Lumbar Region: NA  Muscle:  Temple Region: mild wasting Clavicle Bone Region: mild wasting Clavicle and Acromion Bone Region: moderate wasting Scapular Bone Region: NA Dorsal Hand: mild wasting Patellar Region: wnl Anterior Thigh Region: wnl Posterior Calf Region: wnl  Edema: none noted  Height: Ht Readings from Last 1 Encounters:  07/25/14 6' (1.829 m)    Weight Status:   Wt Readings from Last 1 Encounters:  07/28/14 179 lb 3.7 oz (81.3 kg)    Re-estimated needs:  Kcal: 2000-2300 Protein: 100-120 grams Fluid: 2.3 L/day  Skin: intact  Diet  Order: Diet NPO time specified Except for: Sips with Meds   Intake/Output Summary (Last 24 hours) at 07/28/14 1317 Last data filed at 07/28/14 1314  Gross per 24 hour  Intake    650 ml  Output   1100 ml  Net   -450 ml    Last BM: 1/31   Labs:   Recent Labs Lab 07/23/14 1043  07/23/14 2243  07/24/14 [id number] 07/25/14 0415 07/25/14 1606 07/26/14 0553 07/27/14 0306 07/28/14 0617  NA 142  < > 140  < > 141 139 138 135 137 136  K 2.7*  < > 2.9*  < > 3.3* 2.9* 4.3 4.7 3.8 3.9  CL 95*  < > 90*  < > 93* 85* 86* 85* 88* 91*  CO2 37*  < > 43*  < > 39* 46* 45* 38* 45* 39*  BUN 24*  < > 23  < > 19 17 18 23  24* 28*  CREATININE 1.25  < > 1.17  < > 1.09 1.07 1.24 1.13 1.12 0.98  CALCIUM 8.4  < > 8.9  < > 8.8 8.9 9.1 9.1 9.0 8.9  MG 1.8  < > 2.0  --  1.8 1.6 1.6 1.8  --   --   PHOS 1.8*  1.7*  --  4.0  --  4.6  --   --   --   --   --   GLUCOSE 211*  < > 106*  < > 98 93 122* 83 96 99  < > =  values in this interval not displayed.  CBG (last 3)  No results for input(s): GLUCAP in the last 72 hours.  Scheduled Meds: . aspirin  81 mg Oral Daily  . bisoprolol  5 mg Oral Daily  . famotidine  20 mg Oral BID  . folic acid  1 mg Oral Daily  . furosemide  40 mg Oral Daily  . lisinopril  5 mg Oral Daily  . multivitamin with minerals  1 tablet Oral Daily  . nicotine  21 mg Transdermal Daily  . potassium chloride  20 mEq Oral BID  . sodium chloride  3 mL Intravenous Q12H  . sodium chloride  3 mL Intravenous Q12H  . thiamine  100 mg Oral Daily    Continuous Infusions: . sodium chloride 10 mL/hr at 07/28/14 0738  . feeding supplement (VITAL AF 1.2 CAL) Stopped (07/23/14 1855)    Ian Malkin RD, LDN Inpatient Clinical Dietitian Pager: [phone] After Hours Pager: [phone]

## 2014-07-28 NOTE — Progress Notes (Signed)
Family Medicine Teaching Service Daily Progress Note Intern Pager: [phone]  Patient name: [name] [name] Medical record number: [id number] Date of birth: 1951-04-07 Age: 64 y.o. Gender: male  Primary Care Provider: No primary care provider on file. Consultants: CCM (1/26-1/29) Code Status: Full  Pt Overview and Major Events to Date:  1/25: Admitted with decompensated CHF.  1/26: Underwent EtOH withdrawal and hypercarbic respiratory failure -> ICU 1/27: Self-extubated 1/28: Diuresing (down 9L) 1/29: Transfer to floor on FMTS; transition to po lasix.  1/30 & 1/31: stable respiratory status, CIWA scores 0-3.  2/1: Cardiac catheterization weaning from O2 requirement.   Assessment and Plan: 64 yo male admitted for SOB and leg swelling found to have newly diagnosed decompensated CHF resulting in acute hypercarbic respiratory failure in the setting of known ETOH abuse and CAD risk factors.   Acute systolic heart failure, newly diagnosed: EF: 30-35% with diffuse hypokinesis. 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 CAD. Appears nearly euvolemic now down ~9L from admission. Weight down 24 lbs. Since admission.  - On beta blocker, adding ACE inhibitor lisinopril  qd. , on lasix (now  po daily). Consider spironolactone if remains symptomatic with EF < 35%   - Diuresis going well.  - Cardiac catheterization 2/1 per cardiology, appreciate assistance in management of this pt. - Consider titration of bisprolol and lisinopril to avoid hypotension on discharge.   - Lasix  PO daily - Daily weights, strict I/O - F/U cards recs and cath report.   Demand cardiac ischemia: Mild troponin elevation with plateau due to CHF, arrhythmia, respiratory failure.  - No longer obtaining troponins. Plateau at around 0.2 - ASA  Acute respiratory failure: Improving with continued hypoxia. Extubated 1/27. PE ruled out, CAP unlikely given improvement off abx.  - Wean  oxygen by [place] as able (5L this AM) - Continues to require 5L [place] of O2. Will obtain CXR.  - Wean O2 as tolerated.  - Incentive spirometry - Ambulate with assistance - Monitor for signs of CAP (off abx), remains afebrile.  - Blood Cx (1/25- ) NGTD - CXR with persistent changes of pulmonary edema vs. Pneumonia. Last CXR 1/28. If worsening resp. Status consider re-CXR as above.   COPD: Longstanding tobacco use and wheezing on exam. Appears to have baseline hypercarbia due to COPD, bicarb 45 1/29 and 1/31.  - Albuterol q3h prn - Nicotine patch  Atrial fibrillation: CHAD2VASc: 1. Poor anticoagulation candidate given non-adherence and alcohol abuse - Continue ASA - Telemetry - episodes of vtach overnight. Self resolved.  - EKG this am NSR with RBBB.   Alcohol abuse: With evidence of withdrawal on admission. 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 Supported by macrocytosis, thrombocytopenia. INR: 1.35. Bili 2.0. Albumin 3.3 - Continue CIWA protocol scores 1,3 overnight. No ativan required.  - Thiamine, folate  Protein-calorie malnutrition: Tolerating diet.  - Nutrition consult  Thrombocytopenia: Likely chronic due to alcoholism. Holding heparin. Plts 108>>85> 87 - SCDs  - Continue to monitor - PT/INR 13.8/1.05 respectively. Will continue to follow.   Hypokalemia: Medication-induced most likely, resolved with KDUR BID - Continue to monitor   FEN/GI: Heart healthy, [place] lock IV PPx: SCDs, pepcid  Disposition: PT/OT recommending SNF; SW consulted; continue diuresis awaiting catheterization 2/1.   Subjective:  Doing well this am. No complaints. Says that he is waiting to get the catheterization. Says that he is ready to be "cut open". We discussed the basics of the procedure and that it would not be an "  open" procedure. Deferred further discussion to his visit with Cardiology this am.   Down 24 lbs. Since admit.  I/O -35cc 24 hrs. , - 9.1 L since admit.     Objective: Temp:  [97.5 F (36.4 C)-97.8 F (36.6 C)] 97.5 F (36.4 C) (02/01 0443) Pulse Rate:  [83-88] 85 (02/01 0443) Resp:  [18-22] 21 (02/01 0449) BP: (99-110)/(56-68) 110/68 mmHg (02/01 0449) SpO2:  [93 %-97 %] 96 % (02/01 0449) Weight:  [179 lb 3.7 oz (81.3 kg)] 179 lb 3.7 oz (81.3 kg) (02/01 0443) Physical Exam: General: NAD, resting in bed, talkative this am. Aware of his location, person, and time.  Cardiovascular: RRR, S1/S2 appropriate. No m/r/g. Pulse 2+ distally.  Respiratory: No respiratory distress, diminished breath sounds bilaterally in the middle and lower lung fields. Decent air movement. Unlabored. Appropriate rate.  Abdomen: +BS, soft, NT, ND Extremities: 1+ pitting LE edema (feet) without tenderness. No cyanosis or clubbing.  Neuro: alert x 3 (person/time/ place)  No asterixis this am, finger to nose intact. No other deficits noted.  Laboratory:  Recent Labs Lab 07/26/14 0553 07/27/14 0306 07/28/14 0617  WBC 9.1 8.5 8.5  HGB 15.7 15.3 14.3  HCT 48.1 48.2 45.4  PLT 108* 85* 87*    Recent Labs Lab 07/21/14 2038  07/23/14 0535  07/26/14 0553 07/27/14 0306 07/28/14 0617  NA 138  < > 139  < > 135 137 136  K 4.0  < > 2.5*  < > 4.7 3.8 3.9  CL 95*  < > 91*  < > 85* 88* 91*  CO2 32  < > 34*  < > 38* 45* 39*  BUN 42*  < > 28*  < > 23 24* 28*  CREATININE 1.31  < > 1.34  < > 1.13 1.12 0.98  CALCIUM 9.3  < > 8.7  < > 9.1 9.0 8.9  PROT 7.8  --  5.5*  --   --   --   --   BILITOT 2.0*  --  2.5*  --   --   --   --   ALKPHOS 78  --  53  --   --   --   --   ALT 30  --  20  --   --   --   --   AST 51*  --  35  --   --   --   --   GLUCOSE 106*  < > 106*  < > 83 96 99  < > = values in this interval not displayed.  Imaging/Diagnostic Tests: ECG 1/30: NSR, bifasicular block          2/1: NSR, anterior fascicular block, RBBB.   Yolande Jollyaleb G Kylyn Sookram, MD 07/28/2014, 7:26 AM PGY-1, [facility] FPTS Intern pager: [phone], text pages welcome

## 2014-07-28 NOTE — Interval H&P Note (Signed)
History and Physical Interval Note:  07/28/2014 Cath Lab Visit (complete for each Cath Lab visit)  Clinical Evaluation Leading to the Procedure:   ACS: Yes.    Non-ACS:    Anginal Classification: CCS II  Anti-ischemic medical therapy: Maximal Therapy (2 or more classes of medications)  Non-Invasive Test Results: No non-invasive testing performed  Prior CABG: No previous CABG       3:44 PM  [name] [name]  has presented today for surgery, with the diagnosis of HF  The various methods of treatment have been discussed with the patient and family. After consideration of risks, benefits and other options for treatment, the patient has consented to  Procedure(s): LEFT HEART CATHETERIZATION WITH CORONARY ANGIOGRAM (N/A) as a surgical intervention .  The patient's history has been reviewed, patient examined, no change in status, stable for surgery.  I have reviewed the patient's chart and labs.  Questions were answered to the patient's satisfaction.     Lesleigh NoeSMITH III,Lezley Bedgood W

## 2014-07-28 NOTE — Progress Notes (Signed)
Physical Therapy Treatment Patient Details Name: Colan Laymon MRN: [id number] DOB: [birth year] Today's Date: August 17, 2014    History of Present Illness Pt adm with decompensated heart failure and was intubated on 1/26. Pt self extubated 1/27. PMH -  ETOH abuse, HTN, ?COPD     PT Comments    Pt making good progress with mobility.  Follow Up Recommendations  SNF     Equipment Recommendations  None recommended by PT    Recommendations for Other Services       Precautions / Restrictions Precautions Precautions: Fall    Mobility  Bed Mobility Overal bed mobility: Needs Assistance Bed Mobility: Supine to Sit;Sit to Supine     Supine to sit: Supervision;HOB elevated Sit to supine: Supervision      Transfers Overall transfer level: Needs assistance Equipment used: Rolling walker (2 wheeled) Transfers: Sit to/from Stand Sit to Stand: Min assist         General transfer comment: Assist to bring hips up and for balance.  Ambulation/Gait   Ambulation Distance (Feet): 175 Feet Assistive device: Rolling walker (2 wheeled) Gait Pattern/deviations: Step-through pattern;Decreased stride length;Trunk flexed   Gait velocity interpretation: Below normal speed for age/gender General Gait Details: Verbal cues to stand more erect.   Stairs            Wheelchair Mobility    Modified Rankin (Stroke Patients Only)       Balance Overall balance assessment: Needs assistance Sitting-balance support: No upper extremity supported;Feet supported Sitting balance-Leahy Scale: Good     Standing balance support: Bilateral upper extremity supported Standing balance-Leahy Scale: Poor Standing balance comment: Walker and min guard for static standing                    Cognition Arousal/Alertness: Awake/alert Behavior During Therapy: WFL for tasks assessed/performed Overall Cognitive Status: Impaired/Different from baseline Area of Impairment: Safety/judgement      Memory: Decreased short-term memory   Safety/Judgement: Decreased awareness of safety;Decreased awareness of deficits          Exercises      General Comments        Pertinent Vitals/Pain Pain Assessment: No/denies pain    Home Living                      Prior Function            PT Goals (current goals can now be found in the care plan section) Acute Rehab PT Goals PT Goal Formulation: With patient Time For Goal Achievement: 08/04/14 Potential to Achieve Goals: Good Progress towards PT goals: Goals met and updated - see care plan    Frequency  Min 3X/week    PT Plan Current plan remains appropriate    Co-evaluation             End of Session Equipment Utilized During Treatment: Oxygen Activity Tolerance: Patient tolerated treatment well Patient left: in bed;with call bell/phone within reach;with bed alarm set     Time: 0175-1025 PT Time Calculation (min) (ACUTE ONLY): 17 min  Charges:  $Gait Training: 8-22 mins                    G Codes:      Jhase Creppel 08/17/2014, 11:34 AM  Suanne Marker PT [phone]

## 2014-07-28 NOTE — Progress Notes (Signed)
 SUBJECTIVE:  No new complaints  OBJECTIVE:   Vitals:   Filed Vitals:   07/27/14 1427 07/27/14 2044 07/28/14 0443 07/28/14 0449  BP: 102/63 105/63 99/56 110/68  Pulse: 83 88 85   Temp: 97.5 F (36.4 C) 97.8 F (36.6 C) 97.5 F (36.4 C)   TempSrc: Oral Oral Oral   Resp: 18 18 22 21  Height:      Weight:   179 lb 3.7 oz (81.3 kg)   SpO2: 97% 93% 96% 96%   I&O's:   Intake/Output Summary (Last 24 hours) at 07/28/14 0932 Last data filed at 07/28/14 0802  Gross per 24 hour  Intake    720 ml  Output    650 ml  Net     70 ml   TELEMETRY: Reviewed telemetry pt in NSR:     PHYSICAL EXAM General: Well developed, well nourished, in no acute distress Head:   Normal cephalic and atramatic  Lungs:   Clear bilaterally to auscultation. Heart:   HRRR S1 S2  No JVD.   Abdomen: abdomen soft and non-tender Msk:  Back normal,  Normal strength and tone for age. Extremities:   No edema.  erythema Neuro: Alert and oriented. Psych:  Normal affect, responds appropriately at times; at other times, asks strange questions Skin: No rash   LABS: Basic Metabolic Panel:  Recent Labs  07/25/14 1606 07/26/14 0553 07/27/14 0306 07/28/14 0617  NA 138 135 137 136  K 4.3 4.7 3.8 3.9  CL 86* 85* 88* 91*  CO2 45* 38* 45* 39*  GLUCOSE 122* 83 96 99  BUN 18 23 24* 28*  CREATININE 1.24 1.13 1.12 0.98  CALCIUM 9.1 9.1 9.0 8.9  MG 1.6 1.8  --   --    Liver Function Tests: No results for input(s): AST, ALT, ALKPHOS, BILITOT, PROT, ALBUMIN in the last 72 hours. No results for input(s): LIPASE, AMYLASE in the last 72 hours. CBC:  Recent Labs  07/27/14 0306 07/28/14 0617  WBC 8.5 8.5  HGB 15.3 14.3  HCT 48.2 45.4  MCV 108.3* 108.9*  PLT 85* 87*   Cardiac Enzymes: No results for input(s): CKTOTAL, CKMB, CKMBINDEX, TROPONINI in the last 72 hours. BNP: Invalid input(s): POCBNP D-Dimer: No results for input(s): DDIMER in the last 72 hours. Hemoglobin A1C: No results for input(s):  HGBA1C in the last 72 hours. Fasting Lipid Panel: No results for input(s): CHOL, HDL, LDLCALC, TRIG, CHOLHDL, LDLDIRECT in the last 72 hours. Thyroid Function Tests: No results for input(s): TSH, T4TOTAL, T3FREE, THYROIDAB in the last 72 hours.  Invalid input(s): FREET3 Anemia Panel: No results for input(s): VITAMINB12, FOLATE, FERRITIN, TIBC, IRON, RETICCTPCT in the last 72 hours. Coag Panel:   Lab Results  Component Value Date   INR 1.05 07/28/2014   INR 1.35 07/21/2014   INR 0.9 08/20/2007    RADIOLOGY: Ct Angio Chest Pe W/cm &/or Wo Cm  07/22/2014   CLINICAL DATA:  Progressive dyspnea, swelling, and lethargy for 2 weeks. Initial symptoms 2 months ago.  EXAM: CT ANGIOGRAPHY CHEST WITH CONTRAST  TECHNIQUE: Multidetector CT imaging of the chest was performed using the standard protocol during bolus administration of intravenous contrast. Multiplanar CT image reconstructions and MIPs were obtained to evaluate the vascular anatomy.  CONTRAST:  80mL OMNIPAQUE IOHEXOL 350 MG/ML SOLN  COMPARISON:  None.  FINDINGS: Technically adequate study with good opacification of the central and segmental pulmonary arteries. No focal filling defects demonstrated. No evidence of significant pulmonary embolus.  Cardiac   enlargement. Normal caliber thoracic aorta with calcification. Esophagus is decompressed. No significant lymphadenopathy in the chest.  Bilateral pleural effusions with basilar atelectasis or consolidation, greater on the right. Emphysematous changes throughout the lungs. No pneumothorax.  Included portions of the upper abdominal organs demonstrate diffuse abdominal ascites. Reflux of contrast material into the hepatic veins is consistent with passive congestion. Degenerative changes in the spine. No destructive bone lesions.  Review of the MIP images confirms the above findings.  IMPRESSION: No evidence of significant pulmonary embolus. Cardiac enlargement with passive congestion of the liver.  Bilateral pleural effusions and basilar atelectasis or consolidation, greater on the right. Findings suggest congestive heart failure. Diffuse abdominal ascites.   Electronically Signed   By: William  Stevens M.D.   On: 07/22/2014 04:03   Dg Chest Port 1 View  07/24/2014   CLINICAL DATA:  Respiratory failure.  Shortness of breath.  EXAM: PORTABLE CHEST - 1 VIEW  COMPARISON:  07/23/2014.  FINDINGS: Interim removal of endotracheal tube and NG tube. Left IJ line in stable position. Persistent cardiomegaly and bilateral pulmonary alveolar infiltrates and pleural effusions noted consistent with persistent congestive heart failure. Lower lobe pneumonia cannot be excluded on either side. The findings are stable from prior exam. No pneumothorax. No acute osseus abnormality.  IMPRESSION: 1. Interim removal of endotracheal tube and NG tube. Left IJ line in stable position. 2. Persistent changes of congestive heart failure with bilateral pulmonary edema and pleural effusions. No change from prior exam. Associated bilateral lower lobe pneumonia cannot be excluded .   Electronically Signed   By: Thomas  Register   On: 07/24/2014 07:45   Dg Chest Port 1 View  07/23/2014   CLINICAL DATA:  Respiratory failure  EXAM: PORTABLE CHEST - 1 VIEW  COMPARISON:  Portable chest x-ray of July 22, 2014  FINDINGS: There is a persistent moderate-sized right pleural effusion. The right hemidiaphragm remains obscured. On the left the lung is better aerated and the left heart border better demonstrated. The cardiac silhouette remains enlarged. The pulmonary vascularity is not engorged.  The endotracheal tube tip lies 4.5 cm above the crotch of the carina. The esophagogastric tube tip projects below the inferior margin of the image. The left internal jugular venous catheter tip projects over the midportion of the SVC.  IMPRESSION: Allowing for differences in positioning there has not been dramatic interval change. There remains CHF with a  small to moderate sized right pleural effusion. Left lower lobe atelectasis or pneumonia is noted but may have improved somewhat.   Electronically Signed   By: David  Jordan   On: 07/23/2014 07:44   Dg Chest Port 1 View  07/22/2014   CLINICAL DATA:  LEFT jugular line, intubation, abdominal distension, acute respiratory failure, smoker, COPD, GERD, CHF, asthma  EXAM: PORTABLE CHEST - 1 VIEW  COMPARISON:  Portable exam 1147 hr compared to 07/21/2014  FINDINGS: Tip of endotracheal tube projects approximately 6.1 cm above carina.  Nasogastric tube extends into abdomen.  LEFT jugular central venous catheter tip projects over SVC.  Enlargement of cardiac silhouette with pulmonary vascular congestion.  Perihilar infiltrates bilaterally with RIGHT pleural effusion identified, favor pulmonary edema.  Atherosclerotic calcification aorta.  No pneumothorax or acute osseous findings.  IMPRESSION: No pneumothorax following central line placement.  Probable CHF with small RIGHT pleural effusion.   Electronically Signed   By: Mark  Boles M.D.   On: 07/22/2014 12:18   Dg Chest Port 1 View  07/21/2014   CLINICAL DATA:  63-year-old   male with respiratory distress and shortness of breath for 1 day. Initial encounter.  EXAM: PORTABLE CHEST - 1 VIEW  COMPARISON:  08/19/2007.  FINDINGS: Portable AP view at 2043 hrs. New patchy in veiling opacity at both lung bases. Increased pulmonary vascularity. No pneumothorax. No definite consolidation. Calcified atherosclerosis of the aorta. Normal cardiac size and mediastinal contours. Visualized tracheal air column is within normal limits.  IMPRESSION: Increased pulmonary vascularity with patchy and veiling opacity at both lung bases.  Top differential considerations include pulmonary interstitial edema with pleural effusions and bibasilar pneumonia.   Electronically Signed   By: Lee  Hall M.D.   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  Appears euvolemic.  Although his questions are somewhat bizarre,(such as "Can I clean my car?"...before the cath), he recalls the conversation he had with Dr. C about checking for blockages with a cath.  He had no further questions about the test and can lie flat without difficulty.  I had him do this in the room and he felt fine.   Continue medical therapy for cardiomyopathy, including diuretic, beta blocker, ACE-I and avoidance of alcohol.   Olive Zmuda S Arius Harnois, MD  07/28/2014  9:32 AM    

## 2014-07-28 NOTE — Progress Notes (Signed)
Pt with 7 beats vtach. Pt asymptomatic at time, HR now in 80s. FMTS paged. Pt Assessed for second IV site by 2 RN for pre-cath, unable to get second IV, IV team care order placed. Will continue to monitor. Huel Coventryosenberger, Atanacio Melnyk A, RN

## 2014-07-29 ENCOUNTER — Encounter (HOSPITAL_COMMUNITY): Payer: Self-pay | Admitting: Physician Assistant

## 2014-07-29 DIAGNOSIS — R0602 Shortness of breath: Secondary | ICD-10-CM

## 2014-07-29 DIAGNOSIS — R404 Transient alteration of awareness: Secondary | ICD-10-CM

## 2014-07-29 LAB — BASIC METABOLIC PANEL
ANION GAP: 8 (ref 5–15)
BUN: 30 mg/dL — ABNORMAL HIGH (ref 6–23)
CO2: 37 mmol/L — ABNORMAL HIGH (ref 19–32)
Calcium: 9 mg/dL (ref 8.4–10.5)
Chloride: 92 mmol/L — ABNORMAL LOW (ref 96–112)
Creatinine, Ser: 0.97 mg/dL (ref 0.50–1.35)
GFR calc non Af Amer: 85 mL/min — ABNORMAL LOW (ref >90)
Glucose, Bld: 142 mg/dL — ABNORMAL HIGH (ref 70–99)
Potassium: 4.3 mmol/L (ref 3.5–5.1)
Sodium: 137 mmol/L (ref 135–145)

## 2014-07-29 LAB — CBC WITH DIFFERENTIAL/PLATELET
BASOS PCT: 0 % (ref 0–1)
Basophils Absolute: 0 10*3/uL (ref 0.0–0.1)
Eosinophils Absolute: 0.1 10*3/uL (ref 0.0–0.7)
Eosinophils Relative: 1 % (ref 0–5)
HCT: 44.3 % (ref 39.0–52.0)
Hemoglobin: 14.6 g/dL (ref 13.0–17.0)
LYMPHS ABS: 1.3 10*3/uL (ref 0.7–4.0)
LYMPHS PCT: 15 % (ref 12–46)
MCH: 34.9 pg — ABNORMAL HIGH (ref 26.0–34.0)
MCHC: 33 g/dL (ref 30.0–36.0)
MCV: 106 fL — ABNORMAL HIGH (ref 78.0–100.0)
MONOS PCT: 9 % (ref 3–12)
Monocytes Absolute: 0.7 10*3/uL (ref 0.1–1.0)
Neutro Abs: 6.3 10*3/uL (ref 1.7–7.7)
Neutrophils Relative %: 75 % (ref 43–77)
Platelets: 106 10*3/uL — ABNORMAL LOW (ref 150–400)
RBC: 4.18 MIL/uL — ABNORMAL LOW (ref 4.22–5.81)
RDW: 15 % (ref 11.5–15.5)
WBC: 8.5 10*3/uL (ref 4.0–10.5)

## 2014-07-29 MED ORDER — VITAMIN B-1 100 MG PO TABS
500.0000 mg | ORAL_TABLET | Freq: Three times a day (TID) | ORAL | Status: DC
  Administered 2014-07-29 – 2014-07-30 (×4): 500 mg via ORAL
  Filled 2014-07-29 (×6): qty 5

## 2014-07-29 MED ORDER — VITAMIN B-1 100 MG PO TABS
500.0000 mg | ORAL_TABLET | Freq: Every day | ORAL | Status: DC
  Filled 2014-07-29: qty 5

## 2014-07-29 NOTE — Clinical Social Work Placement (Signed)
     Clinical Social Work Department CLINICAL SOCIAL WORK PLACEMENT NOTE 07/29/2014  Patient:  [name] [name],[name] [name]  Account Number:  [id number] Admit date:  07/21/2014  Clinical Social Worker:  Lupita LeashNNA Thomes Burak, LCSW  Date/time:  07/29/2014 03:54 PM  Clinical Social Work is seeking post-discharge placement for this patient at the following level of care:   SKILLED NURSING   (*CSW will update this form in Epic as items are completed)   07/29/2014  Patient/family provided with [facility] of Clinical Social Works list of facilities offering this level of care within the geographic area requested by the patient (or if unable, by the patients family).  07/29/2014  Patient/family informed of their freedom to choose among providers that offer the needed level of care, that participate in Medicare, Medicaid or managed care program needed by the patient, have an available bed and are willing to accept the patient.  07/29/2014  Patient/family informed of [facility] ownership interest in [facility], as well as of the fact that they are under no obligation to receive care at this facility.  PASARR submitted to EDS on 07/29/2014 PASARR number received on 07/29/2014  FL2 transmitted to all facilities in geographic area requested by pt/family on  07/29/2014 FL2 transmitted to all facilities within larger geographic area on   Patient informed that his/her managed care company has contracts with or will negotiate with  certain facilities, including the following:   No Insurance. Anticipate need for LOG     Patient/family informed of bed offers received:   Patient chooses bed at  Physician recommends and patient chooses bed at    Patient to be transferred to  on   Patient to be transferred to facility by [facility] Patient and family notified of transfer on  Name of family member notified:    The following physician request were entered in Epic: Physician Request  Please  sign FL2.  Please prepare priority discharge summary and prescriptions.    Additional Comments:

## 2014-07-29 NOTE — Progress Notes (Signed)
Family Medicine Teaching Service Daily Progress Note Intern Pager: [phone]  Patient name: [name] RombergClyde [name] Medical record number: [id number] Date of birth: [birth year] Age: 64 y.o. Gender: male  Primary Care Provider: No primary care provider on file. Consultants: CCM (1/26-1/29) Code Status: Full  Pt Overview and Major Events to Date:  1/25: Admitted with decompensated CHF.  1/26: Underwent EtOH withdrawal and hypercarbic respiratory failure -> ICU 1/27: Self-extubated 1/28: Diuresing (down 9L) 1/29: Transfer to floor on FMTS; transition to po lasix.  1/30 & 1/31: stable respiratory status, CIWA scores 0-3.  2/1: Cardiac catheterization weaning from O2 requirement. (unable to perform cath due to cognitive state).  2/2: Continued diuresis. Psych evaluation for competency on 2/3.   Assessment and Plan: 64 yo male admitted for SOB and leg swelling found to have newly diagnosed decompensated CHF resulting in acute hypercarbic respiratory failure in the setting of known ETOH abuse and CAD risk factors.   Cardiac:  - Acute Systolic Heart Failure, Newly Diagnosed.  - Demand Cardiac Ischemia -with mild troponin elevation and subsequent plateau at 0.2.  - Arrhythmia on admission - p waves present unlikely afib. No further arrhythmias during this admission.   EF: 30-35% with diffuse hypokinesis. 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 CAD. Appears nearly euvolemic now down just over 9L from admission. Weight up 2 lbs. overnight, - 22lbs since admission. Attempted Cath yesterday 2/1, however patient unable to adequately consent or understand procedure upon arrival to cath lab. Improving symptomatically at this point with decreasing O2 requirement correlating with diuresis. Will touch base with cardiology about diagnostic cath at this time vs. Continued symptomatic management and monitoring for improvement. Heart rate in the 80's.   - On bisprolol, lisinopril ,  on lasix (now 40mg  po daily). Holding off on aldactone for now, though will consider if remains symptomatic with EF < 35%. Additionally, given ETOH abuse and potential liver disease would be beneficial in the long run.  - Diuresis continues though weight slightly up from yesterday. Pt. Improving symptomatically.  - CXR on 2/1 improving somewhat from previous pointing more toward volume overload than pneumonia at this point. Given afebrile status and normal WBC. Reassuring.  - Diagnostic cardiac catheterization now pending given patient's mental status.  - Consider titration of bisprolol and lisinopril to avoid hypotension on discharge.   - Lasix 40mg  PO daily to continue diuresis.  - Daily BMET - Daily weights, strict I/O  - ASA - Telemetry.  - PT/OT.   Pulmonary:  Acute respiratory failure - improving s/p Intubation and ICU stay Extubated 1/27. PE ruled out and CAP less likely given improving CXR correlating with symptomatic improvement without fevers and normal WBC. PE ruled out.  COPD: Longstanding tobacco use, and intermittent wheeze on exam. Baseline hypercarbia 2/2 COPD, Bicarb trending down 37 today.  Pulmonary Edema: CXR consistent with volume overload and pulmonary edema in the setting of new onset decompensated CHF as above. Improving.   Pt. With multiple pulmonary processes as above. Acute respiratory failure improving. Bicarb trending down. Likely hypercarbic at baseline 2/2 COPD. Lung exam improving, though basilar crackles remain. Tolerating wean from O2 at this time from 5L yesterday to 1L this am.   - Wean O2 by [place] as tolerated (1L at last check).  - Continue Incentive Spirometry.  - Ambulation with assistance.  - Monitor VS for signs of pneumonia i.e. Fevers, WBC, worsening respiratory status.  - CXR on 2/1 with improvement from previous pointing more toward pulmonary edema  rather than infectious process.  - Continue albuterol q3hr. Prn for wheezing / COPD symptoms.  -  Nicotine patch for tobacco abuse.    Neuro:  Alcohol Abuse - with evidence of withdrawal on admission. Concern for confabulation.  Korsakoff Syndrome   Pt. Presented with evidence of ETOH withdrawal and has been on CIWA protocol since admission, though he has scored very low 1-3 and has not required any ativan. However, he does exhibit evidence of confabulation, tangential speech, and confusion. He was initially thought to be able to consent for cardiac cath, however upon arrival to the cath suite he was found to have tangential speech, and limited understanding of the procedure, which was called off as a result. CT of the head revealed global cortical atrophy, lacunar infarct of right thalamus of indeterminate age. Concern for Korsakoff syndrome at this time given history of ETOH abuse, macrocytosis, thrombocytopenia. Synthetic liver function preserved at this time. Spoke with family friend in the room this am "[name] [name]" who has "known the patient for 15 years". He says that he first noticed a change in the patient's mental status 2 weeks ago. He does not know who holds power of attorney at this time.   - Coninue CIWA protocol with ativan per protocol dosing.  - Will initiate high dose thiamine course  x 1 dose,  x 4 days, and  each day thereafter.  - Continue folate.  - Will attempt to contact brother [name] [name].  - Psych consultation for evaluation for capacity.  - continue to monitor mental status.   Heme:  Thrombocytopenia - Plts. Low 100's down to 87 yesterday and back up to 106 today. In 2009 plts. 147. May be due to chronic alcoholism. Repleting thiamine / folate. PT/INR 13.8/1.05 respectively.   - SCD's  - Daily CBC for platelets   FEN/GI:  Protein-Calorie Malnutrition - tolerating diet. Supported by total protein of 5.5, Alb 2.4, Total Cholesterol of 97 and HDL of 12.  - Nutrition on board. Heart Healthy diet. Ensure daily.   Hypokalemia - resolved with Kdur  BID. Holding for now.  - Monitor daily BMET.   - Saline Lock IV.   PPX: SCD's, pepcid.   Disposition: Needs psych evaluation for competency. Holding off on diagnostic cath at this point. Continue diuresis and monitoring for improvement. Attempting to contact brother.   Subjective:   Pt. Tangential during my interview this am. Says that he is feeling much better overall, though making statements that are somewhat odd. When asked about use of the incentive spirometer he responded "yeah, and I have one 3 times that size at home". Friend in the room spoke with me outside after my discussion with pt. He says that he is not at his baseline, and that he has been saying things that have been odd for about 2 weeks now. He is unaware of who has power of attorney.   Down 22 lbs. Since admit. Up 2lbs. Overnight.  I/O +170cc 24 hrs. , - 9L since admit. Unrecorded outs overnight, so unsure of true fluid balance status.   Objective: Temp:  [97.3 F (36.3 C)-98.4 F (36.9 C)] 97.3 F (36.3 C) (02/02 1427) Pulse Rate:  [85-86] 85 (02/02 1427) Resp:  [18-19] 18 (02/02 1427) BP: (110-116)/(69-84) 116/84 mmHg (02/02 1427) SpO2:  [87 %-99 %] 99 % (02/02 1427) Weight:  [181 lb (82.1 kg)] 181 lb (82.1 kg) (02/02 0546) Physical Exam: General: NAD, Alert, Oriented to place, person, and time. Sitting on the side  of the bed.  Cardiovascular: RRR, S1/S2 appropriate. No m/r/g. Pulse 2+ distally.  Respiratory: No respiratory distress, No wheezes, or rales. Crackles in bilateral bases, but much better air movement than yesterday. Unlabored, appropriate rate. On 1L [place].  Abdomen: +BS, soft, NT, ND Extremities: 1+ pitting LE edema (feet) continues.  without tenderness. No cyanosis or clubbing.  Neuro: alert x 3 (person/time/ place)  No asterixis this am, finger to nose intact. No other deficits noted.  Laboratory:  Recent Labs Lab 07/27/14 0306 07/28/14 0617 07/29/14 0725  WBC 8.5 8.5 8.5  HGB 15.3 14.3 14.6   HCT 48.2 45.4 44.3  PLT 85* 87* 106*    Recent Labs Lab 07/23/14 0535  07/27/14 0306 07/28/14 0617 07/29/14 0725  NA 139  < > 137 136 137  K 2.5*  < > 3.8 3.9 4.3  CL 91*  < > 88* 91* 92*  CO2 34*  < > 45* 39* 37*  BUN 28*  < > 24* 28* 30*  CREATININE 1.34  < > 1.12 0.98 0.97  CALCIUM 8.7  < > 9.0 8.9 9.0  PROT 5.5*  --   --   --   --   BILITOT 2.5*  --   --   --   --   ALKPHOS 53  --   --   --   --   ALT 20  --   --   --   --   AST 35  --   --   --   --   GLUCOSE 106*  < > 96 99 142*  < > = values in this interval not displayed.  Imaging/Diagnostic Tests: ECG 1/30: NSR, bifasicular block          2/1: NSR, anterior fascicular block, RBBB.   CXR 2/1 - Mild CHF. (Improvement in pulmonary edema from previous film).   CT Head 2/1:  IMPRESSION: 1. Small lacunar infarction in the right thalamus is age indeterminate. 2. Mild generalized cortical atrophy   Yolande Jolly, MD 07/29/2014, 4:11 PM PGY-1, [facility] Family Medicine FPTS Intern pager: [phone], text pages welcome

## 2014-07-29 NOTE — Progress Notes (Signed)
Pt walked in hallway with front wheel walker. Pt O2 sat 93% on 4Lnc. Pt walked to nursing desk and back, to end of hallway and back. Pt with minimal shortness of breath. Pt now resting comfortably in chair. Will continue to monitor. Huel Coventryosenberger, Canisha Issac A, RN

## 2014-07-29 NOTE — Clinical Social Work Psychosocial (Signed)
     Clinical Social Work Department BRIEF PSYCHOSOCIAL ASSESSMENT 07/29/2014  Patient:  [name] [name], [name] [name]     Account Number:  [id number]     Admit date:  07/21/2014  Clinical Social Worker:  Elam Dutch  Date/Time:  07/29/2014 03:37 PM  Referred by:  Physician  Date Referred:  07/28/2014 Referred for  SNF Placement   Other Referral:   Interview type:  Other - See comment Other interview type:   Pateint and brother  [name] Proud    PSYCHOSOCIAL DATA Living Status:  ALONE Admitted from facility:   Level of care:   Primary support name:  [name] [name]   (c) [phone] Primary support relationship to patient:  SIBLING Degree of support available:   Good support    CURRENT CONCERNS Current Concerns  Post-Acute Placement   Other Concerns:   Patient has had increased confusion since admission.  Patient does not have any insurance. Anticipate need to locate an LOG bed.    SOCIAL WORK ASSESSMENT / PLAN 64 year old male admitted from home where he lives alone. CSW met briefly with patient on 07/27/14. He was lying in bed and was alert to person and place but not has had increased confusion. At the time of this visit- patient related that he lives alone and is agreeable to short term SNF per PT's recommendation. He requested that his brother [name] Proud be contacted to discuss d/c needs further. Fl2 initiated and placed on chart for MD's signature.   Assessment/plan status:  Psychosocial Support/Ongoing Assessment of Needs Other assessment/ plan:   Information/referral to community resources:   SNF bed list provided to patient- copy left in room.  Financial Counselor- [employer] notified of patient not having any insurance. He receives [employer] farm [employer] per brother    PATIENTS/FAMILYS RESPONSE TO PLAN OF CARE: Patient was alert and oriented to person and place on 07/27/14.  He now has increased periods of confusion and MD notes patient has episodes of tangenital thougths. Today the thought he was  in an insurance office; during CSW's visit he thought he was in his apartment. Efforts to orient to current situation were provided but patient became very forgetful.  CSW spoke with brother [name] Proud who stated that he loves his brother but they do not see one another very often (usually holidays or other special days) as his brother keeps to himself much of the time.  He states he will be supportive and invovled in assisting his brother with any d/c needs and is pleased that a SNF search is being activated.  He will agree to sign patient into the SNF when medically stable.  CSW will assist with this once he improves.  Patient was unable to truly respond to the plan of care today.

## 2014-07-29 NOTE — Progress Notes (Signed)
Physical Therapy Treatment Patient Details Name: [name] [name] MRN: [id number] DOB: 07-11-1950 Today's Date: 07/29/2014    History of Present Illness Pt adm with decompensated heart failure and was intubated on 1/26. Pt self extubated 1/27. PMH -  ETOH abuse, HTN, ?COPD     PT Comments    Progressing with mobility but remains with some confusion.  Follow Up Recommendations  SNF     Equipment Recommendations  None recommended by PT    Recommendations for Other Services       Precautions / Restrictions Precautions Precautions: Fall Precaution Comments: watch 02 sats    Mobility  Bed Mobility               General bed mobility comments: pt seated EOB  Transfers Overall transfer level: Needs assistance Equipment used: Rolling walker (2 wheeled) Transfers: Sit to/from Stand Sit to Stand: Min assist         General transfer comment: Assist to bring hips up and for balance.  Ambulation/Gait Ambulation/Gait assistance: Min guard Ambulation Distance (Feet): 175 Feet Assistive device: Rolling walker (2 wheeled) Gait Pattern/deviations: Step-through pattern;Decreased stride length   Gait velocity interpretation: Below normal speed for age/gender General Gait Details: Verbal cues to stand more erect. SaO2 87% while amb on RA.   Stairs            Wheelchair Mobility    Modified Rankin (Stroke Patients Only)       Balance     Sitting balance-Leahy Scale: Good     Standing balance support: No upper extremity supported Standing balance-Leahy Scale: Fair                      Cognition Arousal/Alertness: Awake/alert Behavior During Therapy: Agitated Overall Cognitive Status: Impaired/Different from baseline Area of Impairment: Safety/judgement;Orientation;Attention;Memory;Awareness;Problem solving Orientation Level: Disoriented to;Situation;Place Current Attention Level: Focused Memory: Decreased short-term memory;Decreased recall of  precautions   Safety/Judgement: Decreased awareness of safety;Decreased awareness of deficits Awareness: Intellectual        Exercises      General Comments        Pertinent Vitals/Pain Pain Assessment: No/denies pain    Home Living                      Prior Function            PT Goals (current goals can now be found in the care plan section) Acute Rehab PT Goals Patient Stated Goal: return home Progress towards PT goals: Progressing toward goals    Frequency  Min 2X/week    PT Plan Current plan remains appropriate;Frequency needs to be updated    Co-evaluation             End of Session   Activity Tolerance: Patient tolerated treatment well Patient left: in bed;with call bell/phone within reach;with bed alarm set     Time: [id number] PT Time Calculation (min) (ACUTE ONLY): 12 min  Charges:  $Gait Training: 8-22 mins                    G Codes:      [name] [name] 07/29/2014, 11:54 AM  [name] [name] PT [phone]

## 2014-07-29 NOTE — Progress Notes (Signed)
Occupational Therapy Treatment Patient Details Name: [name] [name] MRN: [id number] DOB: 07/28/1950 Today's Date: 07/29/2014    History of present illness Pt adm with decompensated heart failure and was intubated on 1/26. Pt self extubated 1/27. PMH -  ETOH abuse, HTN, ?COPD    OT comments  Pt appearing more confused than upon evaluation in ICU. Focused on getting his keys and shoes.  Improvement in mobility and standing tolerance for ADL. SNF continues to be the optimal discharge environment.  Follow Up Recommendations  SNF;Supervision/Assistance - 24 hour    Equipment Recommendations       Recommendations for Other Services      Precautions / Restrictions Precautions Precautions: Fall Precaution Comments: watch 02 sats       Mobility Bed Mobility               General bed mobility comments: pt seated EOB  Transfers Overall transfer level: Needs assistance Equipment used: Rolling walker (2 wheeled) Transfers: Sit to/from Stand Sit to Stand: Min assist         General transfer comment: Assist to bring hips up and for balance.    Balance                                   ADL Overall ADL's : Needs assistance/impaired     Grooming: Wash/dry hands;Wash/dry face;Brushing hair;Standing;Minimal assistance           Upper Body Dressing : Minimal assistance;Sitting Upper Body Dressing Details (indicate cue type and reason): front opening gown     Toilet Transfer: Minimal assistance;Ambulation;RW Toilet Transfer Details (indicate cue type and reason): stood to urinate  Toileting- Clothing Manipulation and Hygiene: Minimal assistance;Sit to/from stand         General ADL Comments: Pt focused on needing keys to his home and to get his shoes.      Vision                     Perception     Praxis      Cognition   Behavior During Therapy: Agitated Overall Cognitive Status: Impaired/Different from baseline Area of Impairment:  Safety/judgement;Orientation;Attention;Memory;Awareness;Problem solving Orientation Level: Disoriented to;Situation;Place Current Attention Level: Focused Memory: Decreased short-term memory;Decreased recall of precautions    Safety/Judgement: Decreased awareness of safety;Decreased awareness of deficits Awareness: Intellectual        Extremity/Trunk Assessment               Exercises     Shoulder Instructions       General Comments      Pertinent Vitals/ Pain       Pain Assessment: No/denies pain  Home Living                                          Prior Functioning/Environment              Frequency Min 2X/week     Progress Toward Goals  OT Goals(current goals can now be found in the care plan section)  Progress towards OT goals: Progressing toward goals  Acute Rehab OT Goals Patient Stated Goal: return home  Plan Discharge plan remains appropriate    Co-evaluation                 End of Session Equipment Utilized  During Treatment: Gait belt;Rolling walker;Oxygen   Activity Tolerance Other (comment) (limited by confusion, brief attention span)   Patient Left in bed;with bed alarm set;with family/visitor present   Nurse Communication  (cognitive status changed)        Time: [id number] OT Time Calculation (min): 14 min  Charges: OT General Charges $OT Visit: 1 Procedure OT Treatments $Self Care/Home Management : 8-22 mins  [name] [name], [name] [name] 07/29/2014, 9:32 AM [phone]

## 2014-07-29 NOTE — Progress Notes (Addendum)
Patient: [name] [name] / Admit Date: 07/21/2014 / Date of Encounter: 07/29/2014, 10:22 AM   Subjective: Denies CP or dyspnea. Says he walked at 6am without difficulty. Thinks he's at the insurance company right now. Randomly brought up the fact that he's had a spine replaced (?) and had to walk for 3 weeks before feeling better. Seems to confabulate.  Objective: Telemetry: NSR occasional PVC Physical Exam: Blood pressure 110/69, pulse 86, temperature 98.4 F (36.9 C), temperature source Oral, resp. rate 19, height 6' (1.829 m), weight 181 lb (82.1 kg), SpO2 95 %. General: Well developed, well nourished M, in no acute distress. Appears older than stated age. Head: Normocephalic, atraumatic, sclera non-icteric, no xanthomas, nares are without discharge. Neck:  JVP not elevated. Lungs: Diminished throughout without wheezes, rales, or rhonchi. Breathing is unlabored. Heart: RRR S1 S2 without murmurs, rubs, or gallops.  Abdomen: Soft, non-tender, non-distended with normoactive bowel sounds. No rebound/guarding. Extremities: No clubbing or cyanosis. No edema. Distal pedal pulses are 2+ and equal bilaterally. Neuro: Alert and oriented to self and year, but doesn't know where he is. Tendency for tangentiality,   Intake/Output Summary (Last 24 hours) at 07/29/14 1022 Last data filed at 07/29/14 0923  Gross per 24 hour  Intake    960 ml  Output    450 ml  Net    510 ml    Inpatient Medications:  . aspirin  81 mg Oral Daily  . bisoprolol  5 mg Oral Daily  . famotidine  20 mg Oral BID  . feeding supplement (ENSURE COMPLETE)  237 mL Oral Q24H  . feeding supplement (PRO-STAT SUGAR FREE 64)  30 mL Oral Q24H  . folic acid  1 mg Oral Daily  . furosemide  40 mg Oral Daily  . lisinopril  5 mg Oral Daily  . multivitamin with minerals  1 tablet Oral Daily  . nicotine  21 mg Transdermal Daily  . potassium chloride  20 mEq Oral BID  . sodium chloride  3 mL Intravenous Q12H  . sodium chloride  3 mL  Intravenous Q12H  . thiamine  500 mg Oral TID   Infusions:  . sodium chloride 10 mL/hr at 07/28/14 0738    Labs:  Recent Labs  07/28/14 0617 07/29/14 0725  NA 136 137  K 3.9 4.3  CL 91* 92*  CO2 39* 37*  GLUCOSE 99 142*  BUN 28* 30*  CREATININE 0.98 0.97  CALCIUM 8.9 9.0   No results for input(s): AST, ALT, ALKPHOS, BILITOT, PROT, ALBUMIN in the last 72 hours.  Recent Labs  07/28/14 0617 07/29/14 0725  WBC 8.5 8.5  NEUTROABS  --  6.3  HGB 14.3 14.6  HCT 45.4 44.3  MCV 108.9* 106.0*  PLT 87* 106*   No results for input(s): CKTOTAL, CKMB, TROPONINI in the last 72 hours. Invalid input(s): POCBNP No results for input(s): HGBA1C in the last 72 hours.   Radiology/Studies:  Dg Chest 2 View  07/28/2014   CLINICAL DATA:  Shortness of breath, swelling in both legs for 4 weeks  EXAM: CHEST  2 VIEW  COMPARISON:  07/24/2014  FINDINGS: Bilateral mild interstitial thickening. Small right pleural effusion. No significant left pleural effusion. No pneumothorax. No focal consolidation. Stable cardiomegaly. No acute osseous abnormality.  IMPRESSION: Findings concerning for mild CHF.   Electronically Signed   By: Elige Ko   On: 07/28/2014 14:59   Ct Head Wo Contrast  07/28/2014   CLINICAL DATA:  Altered mental status.  Confusion.  EXAM: CT HEAD WITHOUT CONTRAST  TECHNIQUE: Contiguous axial images were obtained from the base of the skull through the vertex without intravenous contrast.  COMPARISON:  Head CT 08/19/2007 MRI neck 2222  FINDINGS: No acute intracranial hemorrhage. No focal mass lesion. No CT evidence of acute infarction. No midline shift or mass effect. No hydrocephalus. Basilar cisterns are patent.  Small lacunar infarct within the right thalamus is new from prior. Mild generalized atrophy.  Paranasal sinuses and  mastoid air cells are clear.  IMPRESSION: 1. Small lacunar infarction in the right thalamus is age indeterminate. 2. Mild generalized cortical atrophy.    Electronically Signed   By: Genevive Bi M.D.   On: 07/28/2014 19:06   Ct Angio Chest Pe W/cm &/or Wo Cm  07/22/2014   CLINICAL DATA:  Progressive dyspnea, swelling, and lethargy for 2 weeks. Initial symptoms 2 months ago.  EXAM: CT ANGIOGRAPHY CHEST WITH CONTRAST  TECHNIQUE: Multidetector CT imaging of the chest was performed using the standard protocol during bolus administration of intravenous contrast. Multiplanar CT image reconstructions and MIPs were obtained to evaluate the vascular anatomy.  CONTRAST:  80mL OMNIPAQUE IOHEXOL 350 MG/ML SOLN  COMPARISON:  None.  FINDINGS: Technically adequate study with good opacification of the central and segmental pulmonary arteries. No focal filling defects demonstrated. No evidence of significant pulmonary embolus.  Cardiac enlargement. Normal caliber thoracic aorta with calcification. Esophagus is decompressed. No significant lymphadenopathy in the chest.  Bilateral pleural effusions with basilar atelectasis or consolidation, greater on the right. Emphysematous changes throughout the lungs. No pneumothorax.  Included portions of the upper abdominal organs demonstrate diffuse abdominal ascites. Reflux of contrast material into the hepatic veins is consistent with passive congestion. Degenerative changes in the spine. No destructive bone lesions.  Review of the MIP images confirms the above findings.  IMPRESSION: No evidence of significant pulmonary embolus. Cardiac enlargement with passive congestion of the liver. Bilateral pleural effusions and basilar atelectasis or consolidation, greater on the right. Findings suggest congestive heart failure. Diffuse abdominal ascites.   Electronically Signed   By: Burman Nieves M.D.   On: 07/22/2014 04:03   Dg Chest Port 1 View  07/24/2014   CLINICAL DATA:  Respiratory failure.  Shortness of breath.  EXAM: PORTABLE CHEST - 1 VIEW  COMPARISON:  07/23/2014.  FINDINGS: Interim removal of endotracheal tube and NG tube. Left  IJ line in stable position. Persistent cardiomegaly and bilateral pulmonary alveolar infiltrates and pleural effusions noted consistent with persistent congestive heart failure. Lower lobe pneumonia cannot be excluded on either side. The findings are stable from prior exam. No pneumothorax. No acute osseus abnormality.  IMPRESSION: 1. Interim removal of endotracheal tube and NG tube. Left IJ line in stable position. 2. Persistent changes of congestive heart failure with bilateral pulmonary edema and pleural effusions. No change from prior exam. Associated bilateral lower lobe pneumonia cannot be excluded .   Electronically Signed   By: Maisie Fus  Register   On: 07/24/2014 07:45   Dg Chest Port 1 View  07/23/2014   CLINICAL DATA:  Respiratory failure  EXAM: PORTABLE CHEST - 1 VIEW  COMPARISON:  Portable chest x-ray of July 22, 2014  FINDINGS: There is a persistent moderate-sized right pleural effusion. The right hemidiaphragm remains obscured. On the left the lung is better aerated and the left heart border better demonstrated. The cardiac silhouette remains enlarged. The pulmonary vascularity is not engorged.  The endotracheal tube tip lies 4.5 cm above the crotch of the carina. The  esophagogastric tube tip projects below the inferior margin of the image. The left internal jugular venous catheter tip projects over the midportion of the SVC.  IMPRESSION: Allowing for differences in positioning there has not been dramatic interval change. There remains CHF with a small to moderate sized right pleural effusion. Left lower lobe atelectasis or pneumonia is noted but may have improved somewhat.   Electronically Signed   By: David  [place]   On: 07/23/2014 07:44   Dg Chest Port 1 View  07/22/2014   CLINICAL DATA:  LEFT jugular line, intubation, abdominal distension, acute respiratory failure, smoker, COPD, GERD, CHF, asthma  EXAM: PORTABLE CHEST - 1 VIEW  COMPARISON:  Portable exam 1147 hr compared to 07/21/2014   FINDINGS: Tip of endotracheal tube projects approximately 6.1 cm above carina.  Nasogastric tube extends into abdomen.  LEFT jugular central venous catheter tip projects over SVC.  Enlargement of cardiac silhouette with pulmonary vascular congestion.  Perihilar infiltrates bilaterally with RIGHT pleural effusion identified, favor pulmonary edema.  Atherosclerotic calcification aorta.  No pneumothorax or acute osseous findings.  IMPRESSION: No pneumothorax following central line placement.  Probable CHF with small RIGHT pleural effusion.   Electronically Signed   By: Ulyses Southward M.D.   On: 07/22/2014 12:18   Dg Chest Port 1 View  07/21/2014   CLINICAL DATA:  64 year old male with respiratory distress and shortness of breath for 1 day. Initial encounter.  EXAM: PORTABLE CHEST - 1 VIEW  COMPARISON:  08/19/2007.  FINDINGS: Portable AP view at 2043 hrs. New patchy in veiling opacity at both lung bases. Increased pulmonary vascularity. No pneumothorax. No definite consolidation. Calcified atherosclerosis of the aorta. Normal cardiac size and mediastinal contours. Visualized tracheal air column is within normal limits.  IMPRESSION: Increased pulmonary vascularity with patchy and veiling opacity at both lung bases.  Top differential considerations include pulmonary interstitial edema with pleural effusions and bibasilar pneumonia.   Electronically Signed   By: Augusto Gamble M.D.   On: 07/21/2014 21:26     Assessment and Plan  1. Found down with lethargy, hypoxia, hypercarbic respiratory failure requiring intubation 2. EtOH withdrawal/alcohol abuse c/b protein calorie malnutrition 3. Acute systolic CHF, newly diagnosed - EF 30-35% with mod reduced RV function, mod TR, PASP 4. Elevated troponin felt due to demand ischemia (peak 0.24) 5. Thrombocytopenia 6. Heavy tobacco abuse 7. Episodic confusion 8. NSVT, recently quiescent 9. Irregular rhythm by admit EKG possibly felt AF but P waves present on  occasion 10. 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 However, it has been noted there is calcification in the coronaries on CTA thus CAD could also be the cause. The patient was sent down for cath yesterday, however, this was not able to be completed due to his confusion. He was unable to give informed consent. Confusion has periodically been an issue during this admission. He has a tendency for confabulation and tangentiality. At this point would continue to treat him medically and hold off on further invasive evaluation pending stability of his medical condition. It remains uncertain that he would be a great candidate for PCI/subsequent DAPT (versus surgery) anyway given his thrombocytopenia and comorbid conditions. Continue ASA (as long as platelet count remains stable), BB, Lasix, ACEI. LDL is controlled at 68 without statin. May need SNF. Further eval of mental status per IM.  Signed, Ronie Spies PA-C  I have examined the patient and reviewed assessment and plan and  discussed with patient.  Agree with above as stated.  No plans for any invasive testing.  Continue medical therapy for heart failure.  Wean oxygen.  Question of AFib.  ECGs reviewed.  Not clearly AFib.  Regardless, he is not a candidate for anticoagulation at this time due to mental status issues.  Cardiology f/u with Dr. Elease HashimotoNahser.   Rory Montel S.

## 2014-07-30 ENCOUNTER — Inpatient Hospital Stay (HOSPITAL_COMMUNITY): Payer: MEDICAID

## 2014-07-30 ENCOUNTER — Inpatient Hospital Stay (HOSPITAL_COMMUNITY): Payer: Self-pay

## 2014-07-30 DIAGNOSIS — F10230 Alcohol dependence with withdrawal, uncomplicated: Secondary | ICD-10-CM

## 2014-07-30 LAB — BLOOD GAS, ARTERIAL
ACID-BASE EXCESS: 11.3 mmol/L — AB (ref 0.0–2.0)
BICARBONATE: 37.6 meq/L — AB (ref 20.0–24.0)
DRAWN BY: 12971
O2 Content: 2 L/min
O2 Saturation: 96.7 %
PATIENT TEMPERATURE: 98.6
PCO2 ART: 73.8 mmHg — AB (ref 35.0–45.0)
PH ART: 7.327 — AB (ref 7.350–7.450)
TCO2: 39.8 mmol/L (ref 0–100)
pO2, Arterial: 97.7 mmHg (ref 80.0–100.0)

## 2014-07-30 LAB — COMPREHENSIVE METABOLIC PANEL
ALT: 22 U/L (ref 0–53)
AST: 38 U/L — ABNORMAL HIGH (ref 0–37)
Albumin: 3.1 g/dL — ABNORMAL LOW (ref 3.5–5.2)
Alkaline Phosphatase: 62 U/L (ref 39–117)
Anion gap: 8 (ref 5–15)
BUN: 27 mg/dL — ABNORMAL HIGH (ref 6–23)
CHLORIDE: 93 mmol/L — AB (ref 96–112)
CO2: 37 mmol/L — AB (ref 19–32)
Calcium: 9 mg/dL (ref 8.4–10.5)
Creatinine, Ser: 0.89 mg/dL (ref 0.50–1.35)
GFR calc non Af Amer: 88 mL/min — ABNORMAL LOW (ref >90)
Glucose, Bld: 88 mg/dL (ref 70–99)
Potassium: 4.5 mmol/L (ref 3.5–5.1)
Sodium: 138 mmol/L (ref 135–145)
TOTAL PROTEIN: 6.9 g/dL (ref 6.0–8.3)
Total Bilirubin: 1.4 mg/dL — ABNORMAL HIGH (ref 0.3–1.2)

## 2014-07-30 LAB — CBC WITH DIFFERENTIAL/PLATELET
BASOS ABS: 0 10*3/uL (ref 0.0–0.1)
Basophils Relative: 0 % (ref 0–1)
EOS PCT: 2 % (ref 0–5)
Eosinophils Absolute: 0.1 10*3/uL (ref 0.0–0.7)
HCT: 45.1 % (ref 39.0–52.0)
Hemoglobin: 14.3 g/dL (ref 13.0–17.0)
Lymphocytes Relative: 18 % (ref 12–46)
Lymphs Abs: 1.4 10*3/uL (ref 0.7–4.0)
MCH: 34.4 pg — ABNORMAL HIGH (ref 26.0–34.0)
MCHC: 31.7 g/dL (ref 30.0–36.0)
MCV: 108.4 fL — ABNORMAL HIGH (ref 78.0–100.0)
MONOS PCT: 11 % (ref 3–12)
Monocytes Absolute: 0.9 10*3/uL (ref 0.1–1.0)
NEUTROS ABS: 5.4 10*3/uL (ref 1.7–7.7)
NEUTROS PCT: 69 % (ref 43–77)
Platelets: 99 10*3/uL — ABNORMAL LOW (ref 150–400)
RBC: 4.16 MIL/uL — ABNORMAL LOW (ref 4.22–5.81)
RDW: 15.1 % (ref 11.5–15.5)
WBC: 7.8 10*3/uL (ref 4.0–10.5)

## 2014-07-30 LAB — BASIC METABOLIC PANEL
Anion gap: 6 (ref 5–15)
BUN: 29 mg/dL — AB (ref 6–23)
CALCIUM: 8.8 mg/dL (ref 8.4–10.5)
CO2: 38 mmol/L — AB (ref 19–32)
CREATININE: 0.95 mg/dL (ref 0.50–1.35)
Chloride: 92 mmol/L — ABNORMAL LOW (ref 96–112)
GFR calc Af Amer: >90 mL/min (ref >90)
GFR calc non Af Amer: 86 mL/min — ABNORMAL LOW (ref >90)
GLUCOSE: 94 mg/dL (ref 70–99)
Potassium: 4.2 mmol/L (ref 3.5–5.1)
Sodium: 136 mmol/L (ref 135–145)

## 2014-07-30 LAB — PHOSPHORUS: Phosphorus: 4.7 mg/dL — ABNORMAL HIGH (ref 2.3–4.6)

## 2014-07-30 LAB — AMMONIA: Ammonia: 58 umol/L — ABNORMAL HIGH (ref 11–32)

## 2014-07-30 LAB — MAGNESIUM: Magnesium: 2.1 mg/dL (ref 1.5–2.5)

## 2014-07-30 LAB — BRAIN NATRIURETIC PEPTIDE: B NATRIURETIC PEPTIDE 5: 2485.4 pg/mL — AB (ref 0.0–100.0)

## 2014-07-30 MED ORDER — LORAZEPAM 2 MG/ML IJ SOLN: 0.5000 mg | Freq: Four times a day (QID) | INTRAMUSCULAR | Status: DC | PRN

## 2014-07-30 MED ORDER — SODIUM CHLORIDE 0.9 % IV SOLN
500.0000 mg | Freq: Three times a day (TID) | INTRAVENOUS | Status: DC
  Administered 2014-07-30 (×2): 500 mg via INTRAVENOUS
  Filled 2014-07-30 (×7): qty 5

## 2014-07-30 NOTE — Progress Notes (Signed)
Family Medicine Teaching Service Daily Progress Note Intern Pager: [phone]  Patient name: [name] [name] Medical record number: [id number] Date of birth: [birth year] Age: 64 y.o. Gender: male  Primary Care Provider: No primary care provider on file. Consultants: CCM (1/26-1/29) Code Status: Full  Pt Overview and Major Events to Date:  1/25: Admitted with decompensated CHF.  1/26: Underwent EtOH withdrawal and hypercarbic respiratory failure -> ICU 1/27: Self-extubated 1/28: Diuresing (down 9L) 1/29: Transfer to floor on FMTS; transition to po lasix.  1/30 & 1/31: stable respiratory status, CIWA scores 0-3.  2/1: Cardiac catheterization weaning from O2 requirement. (unable to perform cath due to cognitive state).  2/2: Continued diuresis. Psych evaluation for competency on 2/3.  2/3: Pt. With worsening mental status and overtly drowsy this am. PCO2 found to be 73 on ABG. Transferred to stepdown for Bipap.   Assessment and Plan: 64 yo male admitted for SOB and leg swelling found to have newly diagnosed decompensated CHF resulting in acute hypercarbic respiratory failure in the setting of known ETOH abuse and CAD risk factors.   Cardiac:  - Acute Systolic Heart Failure, Newly Diagnosed.  - Demand Cardiac Ischemia -with mild troponin elevation and subsequent plateau at 0.2.  - Arrhythmia on admission - p waves present unlikely afib. No further arrhythmias during this admission.  EF: 30-35% with diffuse hypokinesis. 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 CAD. Appears nearly euvolemic now down around 8.5L from admission. Weight up 1 lbs. overnight, - 23lbs since admission. Attempted Cath 2/1, however patient unable to adequately consent or understand procedure upon arrival to cath lab. Cardiology agrees that he is not a great candidate for catheterization at this time. They will sign off at this time. Pt. Initially symptomatically improving, however has  become hypercarbic again. Previously on Bipap in the ICU. Little recorded output over the past 48 hours. Pulse has been 80's no arrhythmias on tele. Not tachypneic. Blood pressures soft at 105/59.   - Continue bisprolol, lisinopril , on lasix (now  po daily). Holding off on aldactone for now, though will consider if remains symptomatic with EF < 35%. Additionally, given ETOH abuse and potential liver disease would be beneficial in the long run.  - Diuresis continues though weight slightly up from yesterday. Now hypercarbic.  - Repeat CXR today given hypercarbic status and change from previous MS. Now more drowsy.  - Transfer to Step Down unit for closer monitoring.  - Stat EKG pending.  - HF in the setting of concern for thiamine deficiency and ETOH abuse. IV Thiamine as below in Neuro.  - Holding off on cardiac catheterization. Cardiology signed off at this time.  - Consider titration of bisprolol and lisinopril to avoid hypotension on discharge.   - Daily BMET - Daily weights, strict I/O  - ASA - Telemetry.  - PT/OT.   Pulmonary:  Acute respiratory failure - improved previously. s/p Intubation and ICU stay Extubated 1/27. PE ruled out and CAP less likely at that time. Worsening today.  COPD: Longstanding tobacco use, and intermittent wheeze on exam. Baseline hypercarbia 2/2 COPD Pulmonary Edema: CXR consistent with volume overload and pulmonary edema in the setting of new onset decompensated CHF as above.  Pt. With multiple pulmonary processes as above. Likely hypercarbic at baseline 2/2 COPD. Change in mental status this am with increased drowsiness though still arousable. Stat ABG with pCO2 of 73 near level on admission to ICU. Pt. Not tachypneic, tachycardic. Normotensive. Arousable and will talk briefly, but  will fall right back to sleep. Transferring to Step Down for Bipap.   - Bipap per respiratory therapy.    - Repeat CXR pending.  - Ambulation with assistance.  - Monitor VS  for signs of pneumonia i.e. Fevers, WBC - CXR on 2/1 with improvement from previous pointing more toward pulmonary edema rather than infectious process.  - Continue albuterol q3hr. Prn for wheezing / COPD symptoms.  - Nicotine patch for tobacco abuse.    Neuro:  Alcohol Abuse - with evidence of withdrawal on admission. Concern for confabulation.  Korsakoff Syndrome   CIWA 3-4 0.5mg  ativan at 2 pm 2/2?    Pt. Presented with evidence of ETOH withdrawal and has been on CIWA protocol since admission, though he has scored very low 1-3 and has not required any ativan. However, he does exhibit evidence of confabulation, tangential speech, and confusion. He was initially thought to be able to consent for cardiac cath, however upon arrival to the cath suite he was found to have tangential speech, and limited understanding of the procedure, which was called off as a result. CT of the head revealed global cortical atrophy, lacunar infarct of right thalamus of indeterminate age. Concern for Korsakoff syndrome at this time given history of ETOH abuse, macrocytosis, thrombocytopenia. Synthetic liver function preserved at this time. Spoke with family friend in the room "[name]" who has "known the patient for 15 years". He says that he first noticed a change in the patient's mental status 2 weeks ago. He does not know who holds power of attorney at this time.   - Drowsiness and impaired responsiveness this am.  - Pending evaluation by psychiatry for competence.  - Discussed the plan with brother [name]. Awaiting psych eval. Brother aware. He does not hold power of attorney at this time.  - Ammonia level today given change in mental status.  - CT head for acute changes.  - Coninue CIWA protocol with ativan per protocol dosing.  - Will continue high dose thiamine course 500mg  3x daily x 2 days, 250mg  TID x 5 days, and 100mg  each day thereafter.  - Continue folate.  - Brother [name] [phone]. >> Aware and following  closely  - continue to monitor mental status.   Heme:  Thrombocytopenia - Plts. Hovering in low 100's. 99 this am. In 2009 plts. 147. May be due to chronic alcoholism. Repleting thiamine / folate. PT/INR 13.8/1.05 respectively.  - SCD's  - Daily CBC for platelets   FEN/GI:  Protein-Calorie Malnutrition - tolerating diet. Supported by total protein of 5.5, Alb 2.4, Total Cholesterol of 97 and HDL of 12.  - Nutrition on board. Heart Healthy diet. Ensure daily.   Hypokalemia - resolved with Kdur 20meq BID. Holding for now.  - Monitor daily BMET.   - Saline Lock IV.   PPX: SCD's, pepcid.   Disposition: Needs psych evaluation for competency. Holding off on diagnostic cath at this point. Continue diuresis and monitoring for improvement. Attempting to contact brother.   Subjective:  Pt. Much more drowsy this am. Able to state his name, location regarding city (did not respond when asked building), and day. He will wake up to prompting responds "I'm Fine I'm Fine" and will fall back to sleep. Able to reposition to commands. Otherwise minimally talkative.   Up 1lb. Overnight. Inaccurate I/O's over the last 48 hours.   I/O Inaccurate over the past 48 hours. -8.5L since admit per the chart but unclear at this point.   Objective: Temp:  [  97.3 F (36.3 C)-98.1 F (36.7 C)] 98 F (36.7 C) (02/03 0423) Pulse Rate:  [75-89] 75 (02/03 1300) Resp:  [18-21] 21 (02/03 1000) BP: (105-132)/(58-84) 105/58 mmHg (02/03 1300) SpO2:  [95 %-99 %] 96 % (02/03 1300) Weight:  [180 lb 11.1 oz (81.963 kg)] 180 lb 11.1 oz (81.963 kg) (02/03 0423) Physical Exam: General: Drowsy, responds to questioning. Not alert, oriented to person, city, and day.  Cardiovascular: RRR, S1/S2 appropriate. No m/r/g. Pulse 2+ distally.  Respiratory: No tachypnea, mouth breathing, Mild anterior wheeze. Diminished breath sounds posteriorly, poor air movement. Unlabored. On 1.5L [place] this am.  Abdomen: +BS, soft, NT, ND Extremities:  Minimal edema of bilateral feet. Dry skin noted. without tenderness. No cyanosis or clubbing.  Neuro: Not alert (states name, city, day)  No nystagmus, no asterixis, grossly no other deficits, though limited exam due to drowsiness this am. 5/5 strength globally.   Laboratory:  Recent Labs Lab 07/28/14 0617 07/29/14 0725 07/30/14 0535  WBC 8.5 8.5 7.8  HGB 14.3 14.6 14.3  HCT 45.4 44.3 45.1  PLT 87* 106* 99*    Recent Labs Lab 07/28/14 0617 07/29/14 0725 07/30/14 0535  NA 136 137 136  K 3.9 4.3 4.2  CL 91* 92* 92*  CO2 39* 37* 38*  BUN 28* 30* 29*  CREATININE 0.98 0.97 0.95  CALCIUM 8.9 9.0 8.8  GLUCOSE 99 142* 94    Imaging/Diagnostic Tests: ECG 1/30: NSR, bifasicular block          2/1: NSR, anterior fascicular block, RBBB.   CXR 2/1 - Mild CHF. (Improvement in pulmonary edema from previous film).   CT Head 2/1:  IMPRESSION: 1. Small lacunar infarction in the right thalamus is age indeterminate. 2. Mild generalized cortical atrophy   Yolande Jolly, MD 07/30/2014, 2:20 PM PGY-1, [facility] Family Medicine FPTS Intern pager: [phone], text pages welcome

## 2014-07-30 NOTE — Discharge Summary (Signed)
Family Medicine Teaching [facility] Discharge Summary  Patient name: [name] [name] Medical record number: [id number] Date of birth: 10-Jul-1950 Age: 64 y.o. Gender: male Date of Admission: 07/21/2014  Date of Discharge: 2/8 Admitting Physician: Uvaldo Rising, MD  Primary Care Provider: No primary care provider on file. Consultants: CCM, Psychiatry, Cardiology  Indication for Hospitalization: Acute Decompensated CHF  Discharge Diagnoses/Problem List:  CHF ETOH Abuse ETOH Withdrawal Dyspnea Symptomatic Arrhythmia Chronic Obstructive Lung Disease Thrombocytopenia QT prolongation Korsakoff Syndrome Chronic Alcohol Syndrome  Disposition: To SNF  Discharge Condition: Stable  Discharge Exam:  Filed Vitals:   08/04/14 0504  BP:   Pulse: 82  Temp:   Resp:    Physical Exam: General: adult male lying in bed in NAD Cardiovascular: RRR, S1/S2 . No murmur appreciated Respiratory: Baseline with [place] in place. No tachypnea, minimal wheezes, no crackles, rales.  Abdomen: soft, nontender, BS+ Extremities: Dry skin but no frank rashes Neuro: awake, alert / conversant, Acknowledges name, place, day, time.   Brief Hospital Course:  Assessment and Plan: 64 yo male admitted for SOB and leg swelling who was subsequently found to have newly diagnosed decompensated CHF resulting in acute hypercarbic respiratory failure in the setting of known ETOH abuse and CAD risk factors.   Cardiac:  Patient was brought for echocardiogram during this admission and found to have newly Diagnosed. EF: 30-35% with diffuse hypokinesis. This was presumptively due to alcoholic dilated cardiomyopathy and untreated arrhythmia vs. CAD. Patient was initially found to have an arrhythmia on admission without evidence of true atrial fibrillation. Additionally, troponins were mildly elevated at 0.2 which was thought to be due to demand ischemia. He remained asymptomatic, however, and his troponins remained stable at this  level without further increase. Cardiology felt that it was related to demand ischemia and chronic elevation. He also has been found to have a prolonged QT to 514. QT prolonging meds were avoided, and should be avoided on discharge. He also received high dose thiamine during this admission. Will need to continue as an outpatient  qd. Patient was diuresed during this admission, which he responded well to. He required O2 support during his initial CHF exacerbation as well as due to his chronic lung disease. He has a new baseline O2 requirement of 1L [place] mostly due to chronic lung disease. He will continue ASA, Bisprolol, Lisinopril, and Laix  PO daily at discharge. He will need daily weights and weight monitoring to ensure that he does not have further exacerbation.   Additionally, cardiac catheterization to evaluate the etiology of his CHF for CAD was attempted, however patient was intermittently encephalopathic during this admission. He was unable to be brought for catheterization, and this was deferred until a later date at this time.   Pulmonary:  Patient was admitted with evidence of COPD, in addition to pulmonary edema 2/2 acute decompensated CHF. He was initially found to have respiratory failure and required admission to the ICU for Bipap and subsequent intubation. He self-extubated on 1/27 and tolerated high flow nasal canula at that time. PE was ruled out with CT angiography. He was found to be stable for transfer to the floor where he remained stable for several days on 4-5L [place]. He then developed increasing hypercarbia that was compensated, however was transferred to a step down unit to attempt Bipap to help remove CO2. He did not tolerate this due to agitation, and behavior consistent with encephalopathy. He did remain stable on 4L [place]. CCM was consulted and they felt as  we did that he may possibly have a baseline severe hypercarbia that is compensated. He was subsequently weaned from high flow  nasal canula from 4L to [facility] prior to discharge. His respiratory status improved with diuresis and [place] supplementation. He will likely need ongoing COPD evaluation and treatment as an outpatient.   Neuro:  Pt. Was admitted with history of ETOH abuse with multiple "24 packs" of beer found empty in his home prior to bringing him to the hospital. He had evidence of withdrawal on admission. He was placed on CIWA, though required little ativan during this admission. We were careful with ativan given his tenuous respiratory status. He also had evidence of thiamine / folate deficiency consistent with his alcohol withdrawal syndrome. Korsakoff syndrome with evidence of confabulation and tangential speech was also noted. Neuro was consulted and felt similarly, however they also found that he has capacity to make his own medical decisions. We started him on high dose IV thiamine  TID x 2 days, then  TID x 4 days, and then  daily thereafter. His mental status markedly improved with this treatment, and he will continue this as anoutpatient. He will also need to continue folate. He has prolonged QT on EKG and need to be careful with medications including Haldol that potentially could worsen this. We had Risperdal on board in case he required intervention.   Heme:  Patient found to have thrombocytopenia ranging from 85 - 120 during this admission. This apears to be a chronic problem for him and is likely related to folate / thiamine deficiency as evidenced by MCV of 109. We held heparin products and used SCD's for DVT prophylaxis. He will likely benefit from continued monitoring and folate supplementation as an outpatient.   FEN/GI:  Protein-Calorie Malnutrition - tolerating diet. Supported by total protein of 5.5, Alb 2.4, Total Cholesterol of 97 and HDL of 12. Will need ongoing nutritional supplementation as an outpatient. Likely related to chronic alcohol abuse.    Issues for Follow Up:   1. Continue Thiamine  daily.  2. Continue lasix  daily to prevent HF exacerbation.  3. Continue Bisprolol, Ace , Atorvastatin 4. COPD evaluation and treatment with daily controller meds if indicated.   Significant Procedures: None  Significant Labs and Imaging:   Recent Labs Lab 08/02/14 0510 08/03/14 0635 08/04/14 0840  WBC 6.7 6.8 6.4  HGB 13.7 13.3 13.9  HCT 43.5 42.9 43.8  PLT 111* 116* 128*    Recent Labs Lab 07/30/14 0535 07/30/14 1631 07/31/14 0257 08/01/14 0316 08/02/14 0510  NA 136 138 138 138 141  K 4.2 4.5 4.7 5.6* 4.3  CL 92* 93* 95* 98 97  CO2 38* 37* 34* 34* 38*  GLUCOSE 94 88 129* 82 97  BUN 29* 27* 25* 27* 25*  CREATININE 0.95 0.89 0.89 0.90 1.10  CALCIUM 8.8 9.0 8.9 9.1 9.3  MG  --  2.1  --   --   --   PHOS  --  4.7*  --   --   --   ALKPHOS  --  62  --   --   --   AST  --  38*  --   --   --   ALT  --  22  --   --   --   ALBUMIN  --  3.1*  --   --   --       Results/Tests Pending at Time of Discharge: None  Discharge Medications:  Medication List    STOP taking these medications        naproxen sodium 220 MG tablet  Commonly known as:  ANAPROX      TAKE these medications        atorvastatin 40 MG tablet  Commonly known as:  LIPITOR  Take 1 tablet (40 mg total) by mouth daily.     bisoprolol 5 MG tablet  Commonly known as:  ZEBETA  Take 1 tablet (5 mg total) by mouth daily.     folic acid 1 MG tablet  Commonly known as:  FOLVITE  Take 1 tablet (1 mg total) by mouth daily.     furosemide 40 MG tablet  Commonly known as:  LASIX  Take 1 tablet (40 mg total) by mouth daily.     lisinopril 5 MG tablet  Commonly known as:  PRINIVIL,ZESTRIL  Take 1 tablet (5 mg total) by mouth daily.     thiamine 100 MG tablet  Take 1 tablet (100 mg total) by mouth daily.  Start taking on:  08/05/2014        Discharge Instructions: Please refer to Patient Instructions section of EMR for full details.  Patient was counseled  important signs and symptoms that should prompt return to medical care, changes in medications, dietary instructions, activity restrictions, and follow up appointments.   Follow-Up Appointments: Follow-up Information    Follow up with SNF Physician .      Yolande Jollyaleb G Zuma Hust, MD 08/04/2014, 12:39 PM PGY-1, [facility]

## 2014-07-30 NOTE — Progress Notes (Signed)
Patient: [name] [name] / Admit Date: 07/21/2014 / Date of Encounter: 07/30/2014, 11:14 AM   Subjective: Denies CP or dyspnea.   Objective: Telemetry: NSR occasional PVC Physical Exam: Blood pressure 114/83, pulse 75, temperature 98 F (36.7 C), temperature source Oral, resp. rate 21, height 6' (1.829 m), weight 180 lb 11.1 oz (81.963 kg), SpO2 97 %. General: Well developed, well nourished M, in no acute distress. Appears older than stated age. Head: Normocephalic, atraumatic, sclera non-icteric, no xanthomas, nares are without discharge. Neck:  JVP not elevated. Lungs: Diminished throughout without wheezes, rales, or rhonchi. Breathing is unlabored. Heart: RRR S1 S2 without murmurs, rubs, or gallops.  Abdomen: Soft, non-tender, non-distended with normoactive bowel sounds. No rebound/guarding. Extremities: No clubbing or cyanosis. No edema. Distal pedal pulses are 2+ and equal bilaterally. Neuro: Alert and oriented to self and year, but doesn't know where he is. Tendency for tangentiality,   Intake/Output Summary (Last 24 hours) at 07/30/14 1114 Last data filed at 07/30/14 0921  Gross per 24 hour  Intake    420 ml  Output    150 ml  Net    270 ml    Inpatient Medications:  . aspirin  81 mg Oral Daily  . bisoprolol  5 mg Oral Daily  . famotidine  20 mg Oral BID  . feeding supplement (ENSURE COMPLETE)  237 mL Oral Q24H  . feeding supplement (PRO-STAT SUGAR FREE 64)  30 mL Oral Q24H  . folic acid  1 mg Oral Daily  . furosemide  40 mg Oral Daily  . lisinopril  5 mg Oral Daily  . multivitamin with minerals  1 tablet Oral Daily  . nicotine  21 mg Transdermal Daily  . potassium chloride  20 mEq Oral BID  . sodium chloride  3 mL Intravenous Q12H  . sodium chloride  3 mL Intravenous Q12H  . thiamine  500 mg Oral TID   Infusions:  . sodium chloride 10 mL/hr at 07/28/14 0738    Labs:  Recent Labs  07/29/14 0725 07/30/14 0535  NA 137 136  K 4.3 4.2  CL 92* 92*  CO2 37* 38*    GLUCOSE 142* 94  BUN 30* 29*  CREATININE 0.97 0.95  CALCIUM 9.0 8.8   No results for input(s): AST, ALT, ALKPHOS, BILITOT, PROT, ALBUMIN in the last 72 hours.  Recent Labs  07/29/14 0725 07/30/14 0535  WBC 8.5 7.8  NEUTROABS 6.3 5.4  HGB 14.6 14.3  HCT 44.3 45.1  MCV 106.0* 108.4*  PLT 106* 99*   No results for input(s): CKTOTAL, CKMB, TROPONINI in the last 72 hours. Invalid input(s): POCBNP No results for input(s): HGBA1C in the last 72 hours.   Radiology/Studies:  Dg Chest 2 View  07/28/2014   CLINICAL DATA:  Shortness of breath, swelling in both legs for 4 weeks  EXAM: CHEST  2 VIEW  COMPARISON:  07/24/2014  FINDINGS: Bilateral mild interstitial thickening. Small right pleural effusion. No significant left pleural effusion. No pneumothorax. No focal consolidation. Stable cardiomegaly. No acute osseous abnormality.  IMPRESSION: Findings concerning for mild CHF.   Electronically Signed   By: Elige Ko   On: 07/28/2014 14:59   Ct Head Wo Contrast  07/28/2014   CLINICAL DATA:  Altered mental status.  Confusion.  EXAM: CT HEAD WITHOUT CONTRAST  TECHNIQUE: Contiguous axial images were obtained from the base of the skull through the vertex without intravenous contrast.  COMPARISON:  Head CT 08/19/2007 MRI neck 2222  FINDINGS: No acute  intracranial hemorrhage. No focal mass lesion. No CT evidence of acute infarction. No midline shift or mass effect. No hydrocephalus. Basilar cisterns are patent.  Small lacunar infarct within the right thalamus is new from prior. Mild generalized atrophy.  Paranasal sinuses and  mastoid air cells are clear.  IMPRESSION: 1. Small lacunar infarction in the right thalamus is age indeterminate. 2. Mild generalized cortical atrophy.   Electronically Signed   By: Genevive Bi M.D.   On: 07/28/2014 19:06   Ct Angio Chest Pe W/cm &/or Wo Cm  07/22/2014   CLINICAL DATA:  Progressive dyspnea, swelling, and lethargy for 2 weeks. Initial symptoms 2 months ago.   EXAM: CT ANGIOGRAPHY CHEST WITH CONTRAST  TECHNIQUE: Multidetector CT imaging of the chest was performed using the standard protocol during bolus administration of intravenous contrast. Multiplanar CT image reconstructions and MIPs were obtained to evaluate the vascular anatomy.  CONTRAST:  80mL OMNIPAQUE IOHEXOL 350 MG/ML SOLN  COMPARISON:  None.  FINDINGS: Technically adequate study with good opacification of the central and segmental pulmonary arteries. No focal filling defects demonstrated. No evidence of significant pulmonary embolus.  Cardiac enlargement. Normal caliber thoracic aorta with calcification. Esophagus is decompressed. No significant lymphadenopathy in the chest.  Bilateral pleural effusions with basilar atelectasis or consolidation, greater on the right. Emphysematous changes throughout the lungs. No pneumothorax.  Included portions of the upper abdominal organs demonstrate diffuse abdominal ascites. Reflux of contrast material into the hepatic veins is consistent with passive congestion. Degenerative changes in the spine. No destructive bone lesions.  Review of the MIP images confirms the above findings.  IMPRESSION: No evidence of significant pulmonary embolus. Cardiac enlargement with passive congestion of the liver. Bilateral pleural effusions and basilar atelectasis or consolidation, greater on the right. Findings suggest congestive heart failure. Diffuse abdominal ascites.   Electronically Signed   By: Burman Nieves M.D.   On: 07/22/2014 04:03   Dg Chest Port 1 View  07/24/2014   CLINICAL DATA:  Respiratory failure.  Shortness of breath.  EXAM: PORTABLE CHEST - 1 VIEW  COMPARISON:  07/23/2014.  FINDINGS: Interim removal of endotracheal tube and NG tube. Left IJ line in stable position. Persistent cardiomegaly and bilateral pulmonary alveolar infiltrates and pleural effusions noted consistent with persistent congestive heart failure. Lower lobe pneumonia cannot be excluded on either  side. The findings are stable from prior exam. No pneumothorax. No acute osseus abnormality.  IMPRESSION: 1. Interim removal of endotracheal tube and NG tube. Left IJ line in stable position. 2. Persistent changes of congestive heart failure with bilateral pulmonary edema and pleural effusions. No change from prior exam. Associated bilateral lower lobe pneumonia cannot be excluded .   Electronically Signed   By: Maisie Fus  Register   On: 07/24/2014 07:45   Dg Chest Port 1 View  07/23/2014   CLINICAL DATA:  Respiratory failure  EXAM: PORTABLE CHEST - 1 VIEW  COMPARISON:  Portable chest x-ray of July 22, 2014  FINDINGS: There is a persistent moderate-sized right pleural effusion. The right hemidiaphragm remains obscured. On the left the lung is better aerated and the left heart border better demonstrated. The cardiac silhouette remains enlarged. The pulmonary vascularity is not engorged.  The endotracheal tube tip lies 4.5 cm above the crotch of the carina. The esophagogastric tube tip projects below the inferior margin of the image. The left internal jugular venous catheter tip projects over the midportion of the SVC.  IMPRESSION: Allowing for differences in positioning there has not been dramatic  interval change. There remains CHF with a small to moderate sized right pleural effusion. Left lower lobe atelectasis or pneumonia is noted but may have improved somewhat.   Electronically Signed   By: David  SwazilandJordan   On: 07/23/2014 07:44   Dg Chest Port 1 View  07/22/2014   CLINICAL DATA:  LEFT jugular line, intubation, abdominal distension, acute respiratory failure, smoker, COPD, GERD, CHF, asthma  EXAM: PORTABLE CHEST - 1 VIEW  COMPARISON:  Portable exam 1147 hr compared to 07/21/2014  FINDINGS: Tip of endotracheal tube projects approximately 6.1 cm above carina.  Nasogastric tube extends into abdomen.  LEFT jugular central venous catheter tip projects over SVC.  Enlargement of cardiac silhouette with pulmonary  vascular congestion.  Perihilar infiltrates bilaterally with RIGHT pleural effusion identified, favor pulmonary edema.  Atherosclerotic calcification aorta.  No pneumothorax or acute osseous findings.  IMPRESSION: No pneumothorax following central line placement.  Probable CHF with small RIGHT pleural effusion.   Electronically Signed   By: Ulyses SouthwardMark  Boles M.D.   On: 07/22/2014 12:18   Dg Chest Port 1 View  07/21/2014   CLINICAL DATA:  64 year old male with respiratory distress and shortness of breath for 1 day. Initial encounter.  EXAM: PORTABLE CHEST - 1 VIEW  COMPARISON:  08/19/2007.  FINDINGS: Portable AP view at 2043 hrs. New patchy in veiling opacity at both lung bases. Increased pulmonary vascularity. No pneumothorax. No definite consolidation. Calcified atherosclerosis of the aorta. Normal cardiac size and mediastinal contours. Visualized tracheal air column is within normal limits.  IMPRESSION: Increased pulmonary vascularity with patchy and veiling opacity at both lung bases.  Top differential considerations include pulmonary interstitial edema with pleural effusions and bibasilar pneumonia.   Electronically Signed   By: Augusto GambleLee  Hall M.D.   On: 07/21/2014 21:26     Assessment and Plan  1. Found down with lethargy, hypoxia, hypercarbic respiratory failure requiring intubation 2. EtOH withdrawal/alcohol abuse c/b protein calorie malnutrition 3. Acute systolic CHF, newly diagnosed - EF 30-35% with mod reduced RV function, mod TR, PASP 40mmHg 4. Elevated troponin felt due to demand ischemia (peak 0.24) 5. Thrombocytopenia 6. Heavy tobacco abuse 7. Episodic confusion 8. NSVT, recently quiescent 9. Irregular rhythm by admit EKG possibly felt AF but P waves present on occasion 10. 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 However, it has been noted there is calcification in the coronaries on CTA thus CAD could also be the cause. The patient  was sent down for cath on 2/1, however, this was not able to be completed due to his confusion. He was unable to give informed consent. Confusion has periodically been an issue during this admission. He has a tendency for confabulation and tangentiality.  Psych now involved.   At this point would continue to treat him medically and hold off on further invasive evaluation pending stability of his medical condition. It remains uncertain that he would be a great candidate for PCI/subsequent DAPT (versus surgery) anyway given his thrombocytopenia and comorbid conditions. Continue ASA (as long as platelet count remains stable), BB, Lasix, ACEI. LDL is controlled at 68 without statin. May need SNF. Further eval of mental status per IM.   I have examined the patient and reviewed assessment and plan and discussed with patient.  Agree with above as stated.  No plans for any invasive testing.  Continue medical therapy for heart failure.  Wean oxygen.    Question of AFib.  ECGs  reviewed.  Not clearly AFib.  Regardless, he is not a candidate for anticoagulation at this time due to mental status issues.  Cardiology f/u with Dr. Elease Hashimoto.   Will sign off for now. Please call with questions.  VARANASI,JAYADEEP S.

## 2014-07-30 NOTE — Progress Notes (Signed)
Family Practice Teaching Service Interval Progress Note  Micah FlesherWent to check on patient. Nursing reports concern with prn haldol and prolonged QT. Will discontinue prn haldol and ask that nursing call if patient is agitated. Patient is alert and conversational. Will continue to monitor tonight and consider CCM consult if develops somnolence or respiratory issues.   Marikay AlarEric Onalee Steinbach, MD Family Medicine PGY-3 Service Pager [phone]

## 2014-07-30 NOTE — Progress Notes (Signed)
Report called to receiving nurse on Stepdown unit Jillian, EKG done on pt. And EKG paper given to Rapid Response Nurse to take with pt. To next unit, pt. Belongings given to family members present in the room, belongings packed by Press photographerCharge nurse. Pt. Transported to 3South in bed by Rapid Response Nurse and Student nurse.

## 2014-07-30 NOTE — Progress Notes (Addendum)
Received panic ABG lab results about pt.'s pH of 7.37, Bicarb of 37.6, CO2 of 38, and pCO2 of 78.3, I checked pt. And pt. Very drowsy but responsive to verbal stimuli, B/P 115/58 (60) HR of 75 O2 Sats 96% on 1.5 L of O2. Rapid Response Notified and MD on call notified face to face. Pt. Denies feeling bad.

## 2014-07-30 NOTE — Progress Notes (Signed)
Restraint order had expired. Family medicine teaching services paged. Dr. Jaquita RectorMelancon returned paged. Order renewed. Will continue to monitor patient.

## 2014-07-30 NOTE — Progress Notes (Signed)
Utilization review completed.  

## 2014-07-30 NOTE — Significant Event (Signed)
Rapid Response Event Note  Overview: Time Called: 1300 Arrival Time: 1303 Event Type: Respiratory  Initial Focused Assessment: Patient with ABG results:  Ph 7.32 PCO2 73 PO2 97 Bicarb 37 Difficult to arouse. Weak cough Lung sounds diminished  Interventions: Assisted patient to sit on the side of the bed, able to sit upright with minimal support. Continued to interact with patient and encourage cough and deep breathing. Patient became more alert. Transported patient to Ct for head Ct then to 3S04 RN called report to receiving RN Upon arrival to SDU placed patient on Bipap  1540 RN called because patient had become combative while on Bipap. Patient currently of Bipap remains more alert than earlier in the day. MD at bedside to assess patient. Plan continue to monitor patient in SDU off BiPap Patient intermittently combative then quiet and resting. ABG done RN to call if assistance needed.   Event Summary: Name of Physician Notified: MD at bedside at    Name of Consulting Physician Notified: CCM at 1600  Outcome: Transferred (Comment) (3s04)  Event End Time: 1430  Marcellina MillinLayton, Tyreece Gelles

## 2014-07-30 NOTE — Consult Note (Signed)
[facility] Face-to-Face Psychiatry Consult   Reason for Consult:  Capacity evaluation and AMS Referring Physician: Yolande Jolly, MD / Family Medicine  Patient Identification: [name] [name] MRN:  [id number] Principal Diagnosis: Alcohol withdrawal Diagnosis:   Patient Active Problem List   Diagnosis Date Noted  . Shortness of breath [R06.02]   . SOB (shortness of breath) [R06.02]   . Atrial fibrillation [I48.91] 07/24/2014  . CHF exacerbation [I50.9] 07/22/2014  . Acute respiratory failure with hypoxia [J96.01]   . Alcohol withdrawal [F10.239]   . CAP (community acquired pneumonia) [J18.9]   . Acute respiratory failure [J96.00]   . Dyspnea [R06.00]   . CHF (congestive heart failure) [I50.9] 07/21/2014    Total Time spent with patient: 1 hour  Subjective:   [name] [name] is a 64 y.o. male patient admitted with Alcohol withdrawal.  HPI:  [name] is a 64 years old male seen, chart reviewed and case discussed with family medicine resident for psychiatric consultation and evaluation of capacity evaluation. Reportedly patient has been suffered with altered mental status, evidence of ETOH withdrawal and has been on CIWA protocol, he does exhibit evidence of confabulation, tangential speech, and confusion.  reportedly patient was not able to understand need of cardiac catheterization which was postponed at that time. Patient was placed in a stepdown secondary to respiratory difficulty since then he has been stabilized. Patient has been with improved cognitions today. Patient has fair to good orientation, concentration, memory and language functions. Patient reported that he has not taken alcohol last 5 years and also denied drug of abuse. Patient stated that he came to the hospital because of shortness of breath and problems with his heart. Patient reportedly worked for the [employer]  for about 35 years and has been living with his retirement funds. Patient denied previous mental health treatment or  outpatient psychiatric services. Surprisingly patient mental status was improved within last 24 hours as per the medical records.  HPI Elements:  Location:  Altered mental status. Quality:  Poor. Severity:  Multiple psychosocial and medical problems. Timing:  Altered mental status and questionable alcohol withdrawal symptoms.  Past Medical History:  Past Medical History  Diagnosis Date  . Heavy smoker   . CHF (congestive heart failure)   . Anxiety   . Shortness of breath dyspnea   . GERD (gastroesophageal reflux disease)   . Stroke     a. Noted on CT 07/29/14    Past Surgical History  Procedure Laterality Date  . Spine surgery    . Leg surgery    . Left heart catheterization with coronary angiogram N/A 07/28/2014    Procedure: LEFT HEART CATHETERIZATION WITH CORONARY ANGIOGRAM;  Surgeon: Lesleigh Noe, MD;  Location: [facility] CATH LAB;  Service: Cardiovascular;  Laterality: N/A;   Family History: History reviewed. No pertinent family history. Social History:  History  Alcohol Use  . 105.0 oz/week  . 175 Cans of beer per week     History  Drug Use No    History   Social History  . Marital Status: Single    Spouse Name: N/A    Number of Children: N/A  . Years of Education: N/A   Social History Main Topics  . Smoking status: Current Every Day Smoker    Types: Cigarettes  . Smokeless tobacco: None  . Alcohol Use: 105.0 oz/week    175 Cans of beer per week  . Drug Use: No  . Sexual Activity: Not Currently   Other Topics Concern  .  None   Social History Narrative   Additional Social History:                          Allergies:   Allergies  Allergen Reactions  . Levaquin [Levofloxacin In D5w]     Generalized redness; received azithromycin, rocephin, and levaquin on the same day as reaction developed.    Vitals: Blood pressure 130/72, pulse 89, temperature 98 F (36.7 C), temperature source Oral, resp. rate 20, height 6' (1.829 m), weight 81.963 kg  (180 lb 11.1 oz), SpO2 98 %.  Risk to Self: Is patient at risk for suicide?: No Risk to Others:   Prior Inpatient Therapy:   Prior Outpatient Therapy:    Current Facility-Administered Medications  Medication Dose Route Frequency Provider Last Rate Last Dose  . 0.9 %  sodium chloride infusion  250 mL Intravenous PRN Glori Luis, MD 10 mL/hr at 07/25/14 0547 250 mL at 07/25/14 0547  . 0.9 %  sodium chloride infusion  250 mL Intravenous PRN Dwana Melena, PA-C      . 0.9 %  sodium chloride infusion   Intravenous Continuous Dwana Melena, PA-C 10 mL/hr at 07/28/14 [phone]    . albuterol (PROVENTIL) (2.5 MG/3ML) 0.083% nebulizer solution 2.5 mg  2.5 mg Nebulization Q3H PRN Jeanella Craze, NP      . aspirin chewable tablet 81 mg  81 mg Oral Daily Dow Adolph, MD   81 mg at 07/29/14 1002  . bisoprolol (ZEBETA) tablet 5 mg  5 mg Oral Daily Vesta Mixer, MD   5 mg at 07/29/14 1002  . famotidine (PEPCID) tablet 20 mg  20 mg Oral BID Uvaldo Rising, MD   20 mg at 07/29/14 2228  . feeding supplement (ENSURE COMPLETE) (ENSURE COMPLETE) liquid 237 mL  237 mL Oral Q24H Lorraine Lax, RD   237 mL at 07/28/14 2155  . feeding supplement (PRO-STAT SUGAR FREE 64) liquid 30 mL  30 mL Oral Q24H Lorraine Lax, RD   30 mL at 07/30/14 0835  . folic acid (FOLVITE) tablet 1 mg  1 mg Oral Daily Jeanella Craze, NP   1 mg at 07/29/14 1002  . furosemide (LASIX) tablet 40 mg  40 mg Oral Daily Mihai Croitoru, MD   40 mg at 07/29/14 1002  . haloperidol lactate (HALDOL) injection 1-4 mg  1-4 mg Intravenous Q3H PRN Lupita Leash, MD      . lisinopril (PRINIVIL,ZESTRIL) tablet 5 mg  5 mg Oral Daily Mihai Croitoru, MD   5 mg at 07/29/14 1002  . LORazepam (ATIVAN) injection 0.5 mg  0.5 mg Intravenous Q6H PRN Yolande Jolly, MD      . multivitamin with minerals tablet 1 tablet  1 tablet Oral Daily Jeanella Craze, NP   1 tablet at 07/29/14 1002  . nicotine (NICODERM CQ - dosed in mg/24 hours) patch 21 mg  21 mg  Transdermal Daily Dow Adolph, MD   21 mg at 07/29/14 1002  . potassium chloride SA (K-DUR,KLOR-CON) CR tablet 20 mEq  20 mEq Oral BID Dow Adolph, MD   20 mEq at 07/29/14 2227  . sodium chloride 0.9 % injection 3 mL  3 mL Intravenous Q12H Glori Luis, MD   3 mL at 07/29/14 1003  . sodium chloride 0.9 % injection 3 mL  3 mL Intravenous PRN Glori Luis, MD      . sodium chloride  0.9 % injection 3 mL  3 mL Intravenous Q12H Dwana MelenaBryan W Hager, PA-C   3 mL at 07/29/14 2228  . sodium chloride 0.9 % injection 3 mL  3 mL Intravenous PRN Dwana MelenaBryan W Hager, PA-C      . thiamine (VITAMIN B-1) tablet 500 mg  500 mg Oral TID Renee A Kuneff, DO   500 mg at 07/29/14 2228    Musculoskeletal: Strength & Muscle Tone: within normal limits Gait & Station: unable to stand Patient leans: N/A  Psychiatric Specialty Exam: Physical Exam  ROS  Blood pressure 130/72, pulse 89, temperature 98 F (36.7 C), temperature source Oral, resp. rate 20, height 6' (1.829 m), weight 81.963 kg (180 lb 11.1 oz), SpO2 98 %.Body mass index is 24.5 kg/(m^2).  General Appearance: Casual  Eye Contact::  Good  Speech:  Clear and Coherent  Volume:  Normal  Mood:  Euthymic  Affect:  Appropriate and Congruent  Thought Process:  Coherent and Goal Directed  Orientation:  Full (Time, Place, and Person)  Thought Content:  WDL  Suicidal Thoughts:  No  Homicidal Thoughts:  No  Memory:  Immediate;   Fair Recent;   Fair  Judgement:  Fair  Insight:  Fair  Psychomotor Activity:  Decreased  Concentration:  Good  Recall:  Good  Fund of Knowledge:Good  Language: Good  Akathisia:  NA  Handed:  Right  AIMS (if indicated):     Assets:  Communication Skills Desire for Improvement Financial Resources/Insurance Housing Intimacy Leisure Time Physical Health Resilience Social Support Talents/Skills Transportation  ADL's:  Impaired  Cognition: Impaired,  Mild  Sleep:      Medical Decision Making: Review of  Psycho-Social Stressors (1), Discuss test with performing physician (1), Established Problem, Worsening (2), Independent Review of image, tracing or specimen (2), Review of Medication Regimen & Side Effects (2) and Review of New Medication or Change in Dosage (2)  Treatment Plan Summary: Daily contact with patient to assess and evaluate symptoms and progress in treatment and Medication management  Plan:  Patient meets criteria for capacity to make his own medical decisions and living arrangements as for my evaluation today No evidence of imminent risk to self or others at present.   Patient does not meet criteria for psychiatric inpatient admission.   Disposition: Patient may be discharged to the home with the plan of outpatient psychiatric services if needed and medical treatment.  Lafe Clerk,JANARDHAHA R. 07/30/2014 9:09 AM

## 2014-07-30 NOTE — Progress Notes (Signed)
Pt arrived to unit accompanied by RR RN. Pt oriented to room/unit. VS stable.

## 2014-07-30 NOTE — Progress Notes (Signed)
Patient ID: [name] RombergClyde Lovecchio, male   DOB: Mar 12, 1951, 64 y.o.   MRN: [id number]  Receive consult for capacity evaluation. I have attempt for the evaluation but patient is not able to participate except telling his name, spoke with the Staff RN who stated that he was the same earlier during the rounds. Patient does not seem to be at distress even though seems sleepy. Paged [phone]. Will try to evaluate tomorrow if he is able to stay awake and participate. Thanks.    Jekhi Bolin,JANARDHAHA R. 07/30/2014 1:48 PM

## 2014-07-31 LAB — BASIC METABOLIC PANEL
ANION GAP: 9 (ref 5–15)
BUN: 25 mg/dL — AB (ref 6–23)
CALCIUM: 8.9 mg/dL (ref 8.4–10.5)
CO2: 34 mmol/L — ABNORMAL HIGH (ref 19–32)
Chloride: 95 mmol/L — ABNORMAL LOW (ref 96–112)
Creatinine, Ser: 0.89 mg/dL (ref 0.50–1.35)
GFR calc Af Amer: >90 mL/min (ref >90)
GFR calc non Af Amer: 88 mL/min — ABNORMAL LOW (ref >90)
GLUCOSE: 129 mg/dL — AB (ref 70–99)
POTASSIUM: 4.7 mmol/L (ref 3.5–5.1)
Sodium: 138 mmol/L (ref 135–145)

## 2014-07-31 LAB — CBC WITH DIFFERENTIAL/PLATELET
Basophils Absolute: 0 10*3/uL (ref 0.0–0.1)
Basophils Relative: 1 % (ref 0–1)
EOS ABS: 0.1 10*3/uL (ref 0.0–0.7)
EOS PCT: 2 % (ref 0–5)
HEMATOCRIT: 44.4 % (ref 39.0–52.0)
Hemoglobin: 14.2 g/dL (ref 13.0–17.0)
LYMPHS ABS: 1.2 10*3/uL (ref 0.7–4.0)
LYMPHS PCT: 18 % (ref 12–46)
MCH: 34 pg (ref 26.0–34.0)
MCHC: 32 g/dL (ref 30.0–36.0)
MCV: 106.2 fL — ABNORMAL HIGH (ref 78.0–100.0)
MONO ABS: 0.8 10*3/uL (ref 0.1–1.0)
Monocytes Relative: 13 % — ABNORMAL HIGH (ref 3–12)
NEUTROS ABS: 4.4 10*3/uL (ref 1.7–7.7)
NEUTROS PCT: 67 % (ref 43–77)
PLATELETS: 108 10*3/uL — AB (ref 150–400)
RBC: 4.18 MIL/uL — AB (ref 4.22–5.81)
RDW: 15.3 % (ref 11.5–15.5)
WBC: 6.5 10*3/uL (ref 4.0–10.5)

## 2014-07-31 LAB — BLOOD GAS, ARTERIAL
Acid-Base Excess: 12.9 mmol/L — ABNORMAL HIGH (ref 0.0–2.0)
BICARBONATE: 38.8 meq/L — AB (ref 20.0–24.0)
DRAWN BY: 12971
O2 Content: 2 L/min
O2 SAT: 94.9 %
PATIENT TEMPERATURE: 98.6
PH ART: 7.362 (ref 7.350–7.450)
TCO2: 41 mmol/L (ref 0–100)
pCO2 arterial: POSITIVE mmHg (ref 35.0–45.0)
pO2, Arterial: 75.9 mmHg — ABNORMAL LOW (ref 80.0–100.0)

## 2014-07-31 MED ORDER — THIAMINE HCL 100 MG/ML IJ SOLN
250.0000 mg | Freq: Three times a day (TID) | INTRAMUSCULAR | Status: DC
  Filled 2014-07-31 (×3): qty 2.5

## 2014-07-31 MED ORDER — RISPERIDONE 0.25 MG PO TABS: 0.2500 mg | ORAL_TABLET | ORAL | Status: DC | PRN

## 2014-07-31 MED ORDER — FUROSEMIDE 40 MG PO TABS
40.0000 mg | ORAL_TABLET | Freq: Two times a day (BID) | ORAL | Status: DC
  Administered 2014-07-31 – 2014-08-04 (×7): 40 mg via ORAL
  Filled 2014-07-31 (×11): qty 1

## 2014-07-31 MED ORDER — VITAMIN B-1 100 MG PO TABS
250.0000 mg | ORAL_TABLET | Freq: Three times a day (TID) | ORAL | Status: DC
  Administered 2014-07-31 – 2014-08-02 (×6): 250 mg via ORAL
  Filled 2014-07-31 (×9): qty 1

## 2014-07-31 MED ORDER — SODIUM CHLORIDE 0.9 % IV SOLN
Freq: Three times a day (TID) | INTRAVENOUS | Status: DC
  Administered 2014-07-31: 10:00:00 via INTRAVENOUS
  Filled 2014-07-31 (×3): qty 50

## 2014-07-31 MED ORDER — RISPERIDONE 0.25 MG PO TABS
0.2500 mg | ORAL_TABLET | Freq: Two times a day (BID) | ORAL | Status: DC | PRN
  Filled 2014-07-31: qty 1

## 2014-07-31 MED ORDER — NAPROXEN 250 MG PO TABS
250.0000 mg | ORAL_TABLET | Freq: Two times a day (BID) | ORAL | Status: DC | PRN
  Administered 2014-07-31: 250 mg via ORAL
  Filled 2014-07-31 (×3): qty 1

## 2014-07-31 MED ORDER — THIAMINE HCL 100 MG/ML IJ SOLN: 250.0000 mg | Freq: Three times a day (TID) | INTRAMUSCULAR | Status: DC

## 2014-07-31 NOTE — Progress Notes (Signed)
OT Cancellation Note  Patient Details Name: [name] RombergClyde [name] MRN: [id number] DOB: 09-24-1950   Cancelled Treatment:    Reason Eval/Treat Not Completed: Fatigue/lethargy limiting ability to participate. Pt returned to bed and sleeping.  Will continue to follow.  Evern BioMayberry, [place] Alioto Lynn 07/31/2014, 3:15 PM

## 2014-07-31 NOTE — Progress Notes (Signed)
Family Medicine Teaching Service Daily Progress Note Intern Pager: [phone]  Patient name: [name] RombergClyde [name] Medical record number: [id number] Date of birth: 1950-12-11 Age: 64 y.o. Gender: male  Primary Care Provider: No primary care provider on file. Consultants: CCM (1/26-1/29) Code Status: Full  Pt Overview and Major Events to Date:  1/25: Admitted with decompensated CHF.  1/26: Underwent EtOH withdrawal and hypercarbic respiratory failure -> ICU 1/27: Self-extubated 1/28: Diuresing (down 9L) 1/29: Transfer to floor on FMTS; transition to po lasix.  1/30 & 1/31: stable respiratory status, CIWA scores 0-3.  2/1: Cardiac catheterization weaning from O2 requirement. (unable to perform cath due to cognitive state).  2/2: Continued diuresis. Psych evaluation for competency on 2/3.  2/3: Pt. With worsening mental status and overtly drowsy this am. PCO2 found to be 73 on ABG. Transferred to stepdown for Bipap.  2/4: Patient not tolerating Bipap, pCO2 unchanged and patient tolerating [place] at this time.   Assessment and Plan: 64 yo male admitted for SOB and leg swelling found to have newly diagnosed decompensated CHF resulting in acute hypercarbic respiratory failure in the setting of known ETOH abuse and CAD risk factors.   Cardiac:  - Acute Systolic Heart Failure, Newly Diagnosed.  - Demand Cardiac Ischemia -with mild troponin elevation and subsequent plateau at 0.2.  - Arrhythmia on admission - p waves present unlikely afib. No further arrhythmias during this admission.  EF: 30-35% with diffuse hypokinesis. 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 CAD. Appears nearly euvolemic now down around 8.5L from admission. Weight up 1 lbs. overnight, - 23lbs since admission. Attempted Cath 2/1, however patient unable to adequately consent or understand procedure upon arrival to cath lab. Cardiology agrees that he is not a great candidate for catheterization at this  time. They will sign off at this time. Pt. Hypercarbic yesterday in the setting of drowsiness required transfer to Step Down unit for Bipap, however did not tolerate due to agitation. Was much more awake, though still clearly encephalopathic. Continued on diuretics with 1L out overnight, total 1.3L in and positive 275cc. Clinically euvolemic with little lower extremity edema as compared to previous. Stable CXR. Stable on 4L [place]. EKG yesterday similar to previous without acute changes. Likely compensated hypercarbic at baseline due to chronic pulmonary disease.   - Continue bisprolol, lisinopril , continuing lasxi (40mg  po daily). Holding off on aldactone for now, though will consider if remains symptomatic with EF < 35%. Additionally, given ETOH abuse and potential liver disease would be beneficial in the long run.  - Monitoring respiratory status closely, if considering worsening pulmonary edema and not tolerating bipap may increase lasix. - CCM aware and following from afar.  - CXR stable from previous  - Step Down unit for closer monitoring.  - HF in the setting of concern for thiamine deficiency and ETOH abuse. IV Thiamine as below in Neuro.  - Holding off on cardiac catheterization. Cardiology signed off at this time.    - Daily BMET - Daily weights, strict I/O  - ASA - Telemetry.  - PT/OT.   Pulmonary:  Acute respiratory failure - improved previously. s/p Intubation and ICU stay Extubated 1/27. PE ruled out and CAP less likely at that time. Worse yesterday with hypercarbia and attempted bipap that the patient did not tolerate. He is compensating and is not acidotic, so may be near his baseline hypercarbia. Moved to step down where he is monitored closely and is tolerating 4LNC at this time. Stable CXR  from last CXR. Monitoring closely COPD: Longstanding tobacco use, and intermittent wheeze on exam. Baseline hypercarbia 2/2 COPD Pulmonary Edema: CXR consistent with volume overload and  pulmonary edema in the setting of new onset decompensated CHF as above. Worse pulmonary effusion on CXR yesterday as above.   - Bipap as indicated.  - Soft restraints for medical treatment.    - Monitoring O2 requirement and respiratory status.  - CCM following from afar. If worsening respiratory status and not tolerating bipap may consider intubation after consultation with CCM.  - Ambulation with assistance.  - Monitor VS for signs of pneumonia i.e. Fevers, WBC - CXR on 2/3 stable.   - Continue albuterol q3hr. Prn for wheezing / COPD symptoms.  - Nicotine patch for tobacco abuse.    Neuro:  Alcohol Abuse - with evidence of withdrawal on admission. Concern for confabulation.  Korsakoff Syndrome   Pt. Presented with evidence of ETOH withdrawal and has been on CIWA protocol since admission, though he has scored very low 1-3 and has not required any ativan. However, he does exhibit evidence of confabulation, tangential speech, and confusion. Unable to consent for cardiac catheterization. Deferred this procedure for now. CT of the head with global cortical atrophy, lacunar infarct of right thalamus of indeterminate age. Concern for Korsakoff syndrome at this time given history of ETOH abuse, macrocytosis, thrombocytopenia. Synthetic liver function preserved at this time. Spoke with family friend in the room "[name] [name]" who has "known the patient for 15 years". He says that he first noticed a change in the patient's mental status 2 weeks ago. Brother aware and following closely. Psych to attempt evaluation when patient is less drowsy.    - Intermittent agitation. Continued encephalopathy.   - Pending evaluation by psychiatry for competence.  - Discussed the plan with brother [name] [name]. Awaiting psych eval. Brother aware. He does not hold power of attorney at this time.  - Ammonia level 58  - CT head with no acute changes.  - BMET WNL. - Discontinued all sedating medications. Low dose Haldol  for  agitation, though careful given prolonged Qtc.   - Will continue high dose thiamine course  3x daily x 2 days,  TID x 5 days, and  each day thereafter.  - Continue folate.  - Brother [name] [name] [phone]. >> Aware and following closely  - continue to monitor mental status.   Heme:  Thrombocytopenia - Plts. Hovering in low 100's. 99 this am. In 2009 plts. 147. May be due to chronic alcoholism. Repleting thiamine / folate. PT/INR 13.8/1.05 respectively.  - No Lovenox or heparin.  - SCD's  - Daily CBC for platelets   FEN/GI:  Protein-Calorie Malnutrition - tolerating diet. Supported by total protein of 5.5, Alb 2.4, Total Cholesterol of 97 and HDL of 12.  - Nutrition on board. Heart Healthy diet. Ensure daily.   Hypokalemia - resolved with Kdur BID. Holding for now.  - Monitor daily BMET.   - Saline Lock IV.   PPX: SCD's, pepcid.   Disposition: Needs psych evaluation for competency. Tenuous respiratory status. Continue in stepdown for now. Pending psych evaluation and improvement in respiratory status. Continue diuresis and medical management of HF.    Subjective:  Pt. Much more clear and comfortable this am. Sitting on the side of the bed. Recalls, person, place , time, day, date, and year. Reading the paper on my entrance. Recalls what he just read. He was able to name his the city, the hospital, his Brother, where his  brother works, and his brother's address. He is much less agitated during my discussion with him. Says he feels better and would like to try to walk around.   I/O: 1300 in, 1L out. +275 overnight. -8L since admit.    Objective: Temp:  [97.6 F (36.4 C)-97.9 F (36.6 C)] 97.8 F (36.6 C) (02/04 0700) Pulse Rate:  [75-86] 82 (02/04 0744) Resp:  [20-29] 24 (02/04 0744) BP: (105-131)/(58-88) 116/71 mmHg (02/04 0744) SpO2:  [91 %-97 %] 94 % (02/04 0744) FiO2 (%):  [40 %] 40 % (02/03 1417) Weight:  [188 lb 15 oz (85.7 kg)] 188 lb 15 oz (85.7 kg) (02/03  1415) Physical Exam: General: AAOx3, NAD, comfortable on the side of the bed.   Cardiovascular: RRR, S1/S2 appropriate. No m/r/g. Pulse 2+ distally.  Respiratory: No tachypnea, [place] in place, WOB improved, Mild wheezes posteriorly with somewhat poor air movement consistent with COPD, No crackles, rales. [place] 4L in place.  Abdomen: +BS, soft, NT, ND Extremities: Minimal edema of bilateral feet. Dry skin noted. without tenderness. No cyanosis or clubbing.  Neuro: AAOx3, Good recall, No nystagmus, no asterixis, grossly no other deficits  Laboratory:  Recent Labs Lab 07/29/14 0725 07/30/14 0535 07/31/14 0257  WBC 8.5 7.8 6.5  HGB 14.6 14.3 14.2  HCT 44.3 45.1 44.4  PLT 106* 99* 108*    Recent Labs Lab 07/30/14 0535 07/30/14 1631 07/31/14 0257  NA 136 138 138  K 4.2 4.5 4.7  CL 92* 93* 95*  CO2 38* 37* 34*  BUN 29* 27* 25*  CREATININE 0.95 0.89 0.89  CALCIUM 8.8 9.0 8.9  PROT  --  6.9  --   BILITOT  --  1.4*  --   ALKPHOS  --  62  --   ALT  --  22  --   AST  --  38*  --   GLUCOSE 94 88 129*    Imaging/Diagnostic Tests: ECG 1/30: NSR, bifasicular block          2/1: NSR, anterior fascicular block, RBBB.            2/3: NSR RBBB, anterior fascicular block. No acute changes.     CXR 2/1 - Mild CHF. (Improvement in pulmonary edema from previous film).  CXR 2/3 - Cardiomegaly with right pleural effusion and right basilar atelectasis. No overt pulmonary edema.   CT Head 2/1:  IMPRESSION: 1. Small lacunar infarction in the right thalamus is age indeterminate. 2. Mild generalized cortical atrophy  CT Head 2/3:  Similar to the above.   Yolande Jolly, MD 07/31/2014, 9:54 AM PGY-1, [facility] FPTS Intern pager: [phone], text pages welcome

## 2014-08-01 LAB — CBC WITH DIFFERENTIAL/PLATELET
Basophils Absolute: 0 10*3/uL (ref 0.0–0.1)
Basophils Relative: 1 % (ref 0–1)
EOS ABS: 0.2 10*3/uL (ref 0.0–0.7)
Eosinophils Relative: 3 % (ref 0–5)
HCT: 44.4 % (ref 39.0–52.0)
Hemoglobin: 14.2 g/dL (ref 13.0–17.0)
Lymphocytes Relative: 23 % (ref 12–46)
Lymphs Abs: 1.5 10*3/uL (ref 0.7–4.0)
MCH: 34.2 pg — ABNORMAL HIGH (ref 26.0–34.0)
MCHC: 32 g/dL (ref 30.0–36.0)
MCV: 107 fL — AB (ref 78.0–100.0)
MONO ABS: 0.8 10*3/uL (ref 0.1–1.0)
Monocytes Relative: 12 % (ref 3–12)
Neutro Abs: 4 10*3/uL (ref 1.7–7.7)
Neutrophils Relative %: 62 % (ref 43–77)
PLATELETS: 108 10*3/uL — AB (ref 150–400)
RBC: 4.15 MIL/uL — ABNORMAL LOW (ref 4.22–5.81)
RDW: 15.3 % (ref 11.5–15.5)
WBC: 6.5 10*3/uL (ref 4.0–10.5)

## 2014-08-01 LAB — BASIC METABOLIC PANEL
Anion gap: 6 (ref 5–15)
BUN: 27 mg/dL — ABNORMAL HIGH (ref 6–23)
CALCIUM: 9.1 mg/dL (ref 8.4–10.5)
CO2: 34 mmol/L — ABNORMAL HIGH (ref 19–32)
Chloride: 98 mmol/L (ref 96–112)
Creatinine, Ser: 0.9 mg/dL (ref 0.50–1.35)
GFR calc Af Amer: >90 mL/min (ref >90)
GFR calc non Af Amer: 88 mL/min — ABNORMAL LOW (ref >90)
GLUCOSE: 82 mg/dL (ref 70–99)
Potassium: 5.6 mmol/L — ABNORMAL HIGH (ref 3.5–5.1)
SODIUM: 138 mmol/L (ref 135–145)

## 2014-08-01 MED ORDER — LORAZEPAM BOLUS VIA INFUSION: 1.0000 mg | Freq: Once | INTRAVENOUS | Status: DC

## 2014-08-01 MED ORDER — LORAZEPAM 2 MG/ML IJ SOLN: 1.0000 mg | Freq: Once | INTRAMUSCULAR | Status: DC

## 2014-08-01 MED ORDER — LISINOPRIL 5 MG PO TABS
5.0000 mg | ORAL_TABLET | Freq: Every day | ORAL | Status: DC
  Administered 2014-08-03 – 2014-08-04 (×2): 5 mg via ORAL
  Filled 2014-08-01 (×2): qty 1

## 2014-08-01 MED ORDER — CETYLPYRIDINIUM CHLORIDE 0.05 % MT LIQD
7.0000 mL | Freq: Two times a day (BID) | OROMUCOSAL | Status: DC
  Administered 2014-08-01 – 2014-08-03 (×5): 7 mL via OROMUCOSAL

## 2014-08-01 NOTE — Progress Notes (Signed)
Physical Therapy Treatment Patient Details Name: [name] [name] MRN: [id number] DOB: [birth year] Today's Date: 08/01/2014    History of Present Illness Pt adm with decompensated heart failure and was intubated on 1/26. Pt self extubated 1/27. PMH -  ETOH abuse, HTN, ?COPD     PT Comments    Pt continues to require A for mobility and safety.  Pt with poor awareness of deficits and safety with mobility especially when trying to multitask.  Pt had 2 LOB while up trying to negotiate around furniture in the room and required A to prevent falls.  Pt also mentioning the "school children" that kept him up all night last night.  When asked about children, he stated they were at school now, but that "they must live here".  At this time feel pt is unsafe to return to home alone.  He mentions he only has a brother who can "help sometimes".  If pt is to D/C home, then [facility]/RN/Aide/SW.    Follow Up Recommendations  SNF     Equipment Recommendations  Rolling walker with 5" wheels    Recommendations for Other Services       Precautions / Restrictions Precautions Precautions: Fall Precaution Comments: watch 02 sats Restrictions Weight Bearing Restrictions: No    Mobility  Bed Mobility Overal bed mobility: Modified Independent                Transfers Overall transfer level: Needs assistance Equipment used: Rolling walker (2 wheeled) Transfers: Sit to/from Stand Sit to Stand: Min guard         General transfer comment: Definite need for UE support.    Ambulation/Gait Ambulation/Gait assistance: Min assist Ambulation Distance (Feet): 200 Feet Assistive device: Rolling walker (2 wheeled) Gait Pattern/deviations: Step-through pattern;Decreased stride length;Trunk flexed     General Gait Details: cues for use of RW and upright psoture.  pt with increased difficulty managing balance and RW in room around obstacles and needs MinA x2 to prevent fall.   pt with poor awareness of lines and at this time is on 4L O2 with sats 90% during ambulation.     Stairs            Wheelchair Mobility    Modified Rankin (Stroke Patients Only)       Balance Overall balance assessment: Needs assistance Sitting-balance support: No upper extremity supported;Feet supported Sitting balance-Leahy Scale: Good     Standing balance support: No upper extremity supported Standing balance-Leahy Scale: Poor Standing balance comment: pt needs UE support for dynamic balance.                      Cognition Arousal/Alertness: Awake/alert Behavior During Therapy: WFL for tasks assessed/performed Overall Cognitive Status: Impaired/Different from baseline (Unclear if this is baseline) Area of Impairment: Attention;Safety/judgement;Awareness;Problem solving;Memory   Current Attention Level: Selective Memory: Decreased short-term memory   Safety/Judgement: Decreased awareness of safety;Decreased awareness of deficits Awareness: Emergent Problem Solving: Difficulty sequencing;Requires verbal cues;Requires tactile cues;Slow processing General Comments: pt with poor awareness of deficits and safety.  pt also stating that the "school children" were keeping him up all night last night.  when asked about the children he stated they were at school now, but that "they must live here".      Exercises      General Comments        Pertinent Vitals/Pain Pain Assessment: No/denies pain    Home Living  Prior Function            PT Goals (current goals can now be found in the care plan section) Acute Rehab PT Goals Patient Stated Goal: return home PT Goal Formulation: With patient Time For Goal Achievement: 08/04/14 Potential to Achieve Goals: Good Progress towards PT goals: Progressing toward goals    Frequency  Min 2X/week    PT Plan Current plan remains appropriate    Co-evaluation             End  of Session Equipment Utilized During Treatment: Gait belt;Oxygen Activity Tolerance: Patient tolerated treatment well Patient left: in bed;with call bell/phone within reach;with nursing/sitter in room     Time: 1610-9604 PT Time Calculation (min) (ACUTE ONLY): 24 min  Charges:  $Gait Training: 23-37 mins                    G CodesSunny Schlein, [place] [phone] 08/01/2014, 12:21 PM

## 2014-08-01 NOTE — Progress Notes (Signed)
Pt. Is alert and oriented to person and place. Bruising noted on arms bilaterally.  Pt. Appears very impulsive and non-compliant with wearing oxygen and telemetry monitor. Spoke with MD about need for safety sitter, pt. Impulsive behavior, and not wanting to wear O2. Pt. Saturation does drop with without oxygen. Security came and assessed the room for pt. And aided in getting pt. Settled down. MD ordered ativan to help with agitation. Safety sitter now placed at bedside. Will continue to monitor.

## 2014-08-01 NOTE — Clinical Social Work Note (Addendum)
CSW had been working to identify a SNF placement for the pt. CSW has not received a bed offer for the pt. The barrier: the pt's ETOH use. CSW will continue to make effort in locating a SNF for the pt.   CSW also spoke with the pt's brother [name] [name] who expressed concern regarding the pt's transitioning back home alone. [name] [name] reported feeling the pt needs a higher level of care before the pt discharge home.   Addendum: Pt has agreed to SNF placement. [facility].   [name] [name], MSW, LCSWA [phone]

## 2014-08-01 NOTE — Progress Notes (Signed)
Family Medicine Teaching Service Daily Progress Note Intern Pager: [phone]  Patient name: [name] [name] Medical record number: [id number] Date of birth: [birth year] Age: 64 y.o. Gender: male  Primary Care Provider: No primary care provider on file. Consultants: CCM (1/26-1/29) Code Status: Full  Pt Overview and Major Events to Date:  1/25: Admitted with decompensated CHF.  1/26: Underwent EtOH withdrawal and hypercarbic respiratory failure -> ICU 1/27: Self-extubated 1/28: Diuresing (down 9L) 1/29: Transfer to floor on FMTS; transition to po lasix.  1/30 & 1/31: stable respiratory status, CIWA scores 0-3.  2/1: Cardiac catheterization weaning from O2 requirement. (unable to perform cath due to cognitive state).  2/2: Continued diuresis. Psych evaluation for competency on 2/3.  2/3: Pt. With worsening mental status and overtly drowsy this am. PCO2 found to be 73 on ABG. Transferred to stepdown for Bipap.  2/4: Patient not tolerating Bipap, pCO2 unchanged and patient tolerating [place] at this time.  2/5: Improving without bipap. Monitor o2 requirement. Improving overall with regard to mental status.   Assessment and Plan: 64 yo male admitted for SOB and leg swelling found to have newly diagnosed decompensated CHF resulting in acute hypercarbic respiratory failure in the setting of known ETOH abuse and CAD risk factors.   Cardiac:  - Acute Systolic Heart Failure, Newly Diagnosed.  - Demand Cardiac Ischemia -with mild troponin elevation and subsequent plateau at 0.2.  - Arrhythmia on admission - p waves present unlikely afib. No further arrhythmias during this admission.  EF: 30-35% with diffuse hypokinesis. 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 CAD. Appears nearly euvolemic now down around 8.5L from admission.Down - 23lbs since admission. Attempted Cath 2/1, however patient unable to adequately consent or understand procedure upon arrival to cath lab.  Cardiology agrees that he is not a great candidate for catheterization at this time. They will sign off at this time. S/P hypercarbia improved without BIPAP in step down. Mental status improving.   - Continue bisprolol, lisinopril , continuing lasxi (  po daily). Holding off on aldactone for now, though will consider if remains symptomatic with EF < 35%. Additionally, given ETOH abuse and potential liver disease would be beneficial in the long run.  - Monitoring respiratory status closely. Improved without bipap, though on 4L O2.   - Transfer to floor.  - HF in the setting of concern for thiamine deficiency and ETOH abuse. IV Thiamine as below in Neuro.  - Holding off on cardiac catheterization. Cardiology signed off at this time.    - Daily BMET - Daily weights, strict I/O  - ASA - Telemetry.  - PT/OT.   Pulmonary:  Acute respiratory failure - improved previously. s/p Intubation and ICU stay Extubated 1/27. PE ruled out and CAP less likely at that time. Not requiring BIpap.  Stable CXR  from last CXR. Monitoring closely COPD: Longstanding tobacco use, and intermittent wheeze on exam. Baseline hypercarbia 2/2 COPD Pulmonary Edema: CXR consistent with volume overload and pulmonary edema in the setting of new onset decompensated CHF as above. Worse pulmonary effusion on CXR yesterday as above.   - Bipap as indicated and if tolerated.   - Soft restraints for medical treatment.    - Monitoring O2 requirement and respiratory status.  - Will ambulate and document O2 requirement. O2 [place] is likely a new baseline for him given his chronic pulmonary disease.   - Ambulation with assistance.  - Monitor VS for signs of pneumonia i.e. Fevers, WBC - CXR on 2/3 stable.   -  Continue albuterol q3hr. Prn for wheezing / COPD symptoms.  - Nicotine patch for tobacco abuse.    Neuro:  Alcohol Abuse - with evidence of withdrawal on admission. Concern for confabulation.  Korsakoff Syndrome   Pt. Presented  with evidence of ETOH withdrawal and has been on CIWA protocol since admission, though he has scored very low 1-3 and has not required any ativan. However, he does exhibit evidence of confabulation, tangential speech, and confusion. Unable to consent for cardiac catheterization. Deferred this procedure for now. CT of the head with global cortical atrophy, lacunar infarct of right thalamus of indeterminate age. Concern for Korsakoff syndrome at this time given history of ETOH abuse, macrocytosis, thrombocytopenia. Synthetic liver function preserved at this time. Spoke with family friend in the room "Thayer Ohm" who has "known the patient for 15 years". He says that he first noticed a change in the patient's mental status 2 weeks ago. Brother aware and following closely. Psych to attempt evaluation when patient is less drowsy.    - Intermittent agitation.  - Psych eval with competence.  - Discuss with brother.  - Ammonia level 58 previously.  - CT head with no acute changes.  - BMET WNL. - Low dose Haldol  for agitation, though careful given prolonged Qtc.   - Will continue high dose thiamine course  3x daily x 2 days,  TID x 5 days, and  each day thereafter.  - Continue folate.  - Brother Trey Paula [phone]. >> Aware and following closely  - continue to monitor mental status.   Heme:  Thrombocytopenia - Plts. Hovering in low 100's. 99 this am. In 2009 plts. 147. May be due to chronic alcoholism. Repleting thiamine / folate. PT/INR 13.8/1.05 respectively.  - No Lovenox or heparin.  - SCD's  - Daily CBC for platelets   FEN/GI:  Protein-Calorie Malnutrition - tolerating diet. Supported by total protein of 5.5, Alb 2.4, Total Cholesterol of 97 and HDL of 12.  - Nutrition on board. Heart Healthy diet. Ensure daily.   Hypokalemia - resolved with Kdur BID. Holding for now.  - Monitor daily BMET.   - Saline Lock IV.   PPX: SCD's, pepcid.   Disposition: Ambulate for documentation  of O2 requiremnt. Improved from a fluid stand point. Will likely D/C home tomorrow with O2 if doing ok.     Subjective:  Continues to be much clearer today. Short term memory somewhat in tact. Says that he feels "fine". He says that he would like to go home, and not go to SNF. He is ready to get up and walk around.   Net negative 8.5L since admission.   Objective: Temp:  [97.6 F (36.4 C)-98.6 F (37 C)] 98.4 F (36.9 C) (02/05 1619) Pulse Rate:  [77-83] 82 (02/05 1619) Resp:  [18-31] 18 (02/05 1619) BP: (113-143)/(68-95) 143/95 mmHg (02/05 1619) SpO2:  [93 %-95 %] 95 % (02/05 1619) Weight:  [177 lb 14.4 oz (80.695 kg)] 177 lb 14.4 oz (80.695 kg) (02/05 1500) Physical Exam: General: AAOx3, NAD, comfortable on the side of the bed.   Cardiovascular: RRR, S1/S2 appropriate. No m/r/g. Pulse 2+ distally.  Respiratory: Baseline with [place] in place. No tachypnea, [place] in place, WOB improved, Mild wheezes posteriorly with somewhat poor air movement consistent with COPD, No crackles, rales.  Abdomen: +BS, soft, NT, ND Extremities: Minimal edema of bilateral feet. Dry skin noted. without tenderness. No cyanosis or clubbing.  Neuro: AAOx3, Good recall, No nystagmus, no asterixis, grossly no other deficits  Laboratory:  Recent Labs Lab 07/30/14 0535 07/31/14 0257 08/01/14 0316  WBC 7.8 6.5 6.5  HGB 14.3 14.2 14.2  HCT 45.1 44.4 44.4  PLT 99* 108* 108*    Recent Labs Lab 07/30/14 1631 07/31/14 0257 08/01/14 0316  NA 138 138 138  K 4.5 4.7 5.6*  CL 93* 95* 98  CO2 37* 34* 34*  BUN 27* 25* 27*  CREATININE 0.89 0.89 0.90  CALCIUM 9.0 8.9 9.1  PROT 6.9  --   --   BILITOT 1.4*  --   --   ALKPHOS 62  --   --   ALT 22  --   --   AST 38*  --   --   GLUCOSE 88 129* 82    Imaging/Diagnostic Tests: ECG 1/30: NSR, bifasicular block          2/1: NSR, anterior fascicular block, RBBB.            2/3: NSR RBBB, anterior fascicular block. No acute changes.     CXR 2/1 - Mild CHF.  (Improvement in pulmonary edema from previous film).  CXR 2/3 - Cardiomegaly with right pleural effusion and right basilar atelectasis. No overt pulmonary edema.   CT Head 2/1:  IMPRESSION: 1. Small lacunar infarction in the right thalamus is age indeterminate. 2. Mild generalized cortical atrophy  CT Head 2/3:  Similar to the above.   Yolande Jollyaleb G Christifer Chapdelaine, MD 08/01/2014, 5:48 PM PGY-1, [facility] FPTS Intern pager: [phone], text pages welcome

## 2014-08-01 NOTE — Progress Notes (Signed)
Patient to transfer to [id number] report given to receiving nurse all questions answered at this time.  Pt. VSS with no s/s of distress noted.  Patient stable at transfer.

## 2014-08-01 NOTE — Progress Notes (Signed)
Pt does not appear to be in any distress at this time. Pt is resting comfortably on 4L, Sats 91%. BiPap remains at bedside. RT will monitor.

## 2014-08-02 DIAGNOSIS — R404 Transient alteration of awareness: Secondary | ICD-10-CM | POA: Insufficient documentation

## 2014-08-02 LAB — CBC WITH DIFFERENTIAL/PLATELET
Basophils Absolute: 0 10*3/uL (ref 0.0–0.1)
Basophils Relative: 0 % (ref 0–1)
Eosinophils Absolute: 0.3 10*3/uL (ref 0.0–0.7)
Eosinophils Relative: 4 % (ref 0–5)
HCT: 43.5 % (ref 39.0–52.0)
HEMOGLOBIN: 13.7 g/dL (ref 13.0–17.0)
Lymphocytes Relative: 22 % (ref 12–46)
Lymphs Abs: 1.5 10*3/uL (ref 0.7–4.0)
MCH: 33.8 pg (ref 26.0–34.0)
MCHC: 31.5 g/dL (ref 30.0–36.0)
MCV: 107.4 fL — AB (ref 78.0–100.0)
MONOS PCT: 10 % (ref 3–12)
Monocytes Absolute: 0.7 10*3/uL (ref 0.1–1.0)
NEUTROS PCT: 64 % (ref 43–77)
Neutro Abs: 4.3 10*3/uL (ref 1.7–7.7)
PLATELETS: 111 10*3/uL — AB (ref 150–400)
RBC: 4.05 MIL/uL — ABNORMAL LOW (ref 4.22–5.81)
RDW: 15.4 % (ref 11.5–15.5)
WBC: 6.7 10*3/uL (ref 4.0–10.5)

## 2014-08-02 LAB — BASIC METABOLIC PANEL
ANION GAP: 6 (ref 5–15)
BUN: 25 mg/dL — ABNORMAL HIGH (ref 6–23)
CALCIUM: 9.3 mg/dL (ref 8.4–10.5)
CO2: 38 mmol/L — ABNORMAL HIGH (ref 19–32)
CREATININE: 1.1 mg/dL (ref 0.50–1.35)
Chloride: 97 mmol/L (ref 96–112)
GFR calc Af Amer: 80 mL/min — ABNORMAL LOW (ref >90)
GFR, EST NON AFRICAN AMERICAN: 69 mL/min — AB (ref >90)
GLUCOSE: 97 mg/dL (ref 70–99)
POTASSIUM: 4.3 mmol/L (ref 3.5–5.1)
SODIUM: 141 mmol/L (ref 135–145)

## 2014-08-02 MED ORDER — THIAMINE HCL 100 MG/ML IJ SOLN
250.0000 mg | Freq: Three times a day (TID) | INTRAMUSCULAR | Status: DC
  Filled 2014-08-02 (×3): qty 2.5

## 2014-08-02 NOTE — Progress Notes (Signed)
Family Medicine Teaching Service Daily Progress Note Intern Pager: [phone]  Patient name: [name] [name] Medical record number: [id number] Date of birth: 1950-12-28 Age: 64 y.o. Gender: male  Primary Care Provider: No primary care provider on file. Consultants: CCM (1/26-1/29) Code Status: Full  Pt Overview and Major Events to Date:  1/25: Admitted with decompensated CHF.  1/26: Underwent EtOH withdrawal and hypercarbic respiratory failure -> ICU 1/27: Self-extubated 1/28: Diuresing (down 9L) 1/29: Transfer to floor on FMTS; transition to po lasix.  1/30 & 1/31: stable respiratory status, CIWA scores 0-3.  2/1: Cardiac catheterization weaning from O2 requirement. (unable to perform cath due to cognitive state).  2/2: Continued diuresis. Psych evaluation for competency on 2/3.  2/3: Pt. With worsening mental status and overtly drowsy this am. PCO2 found to be 73 on ABG. Transferred to stepdown for Bipap.  2/4: Patient not tolerating Bipap, pCO2 unchanged and patient tolerating [place] at this time.  2/5: Improving without bipap. Monitor o2 requirement. Improving overall with regard to mental status. 2/5: o/n required safety sitting due to attempting to remove O2 despite desat, agitated, improved w/ ativan 2/6: continued safety sitter, reduced agitation and cooperative with O2   Assessment and Plan: 64 yo male admitted for SOB and leg swelling found to have newly diagnosed decompensated CHF resulting in acute hypercarbic respiratory failure in the setting of known ETOH abuse and CAD risk factors.   Cardiac:  - Acute Systolic Heart Failure, Newly Diagnosed.  - Demand Cardiac Ischemia -with mild troponin elevation and subsequent plateau at 0.2.  - Arrhythmia on admission - p waves present unlikely afib. No further arrhythmias during this admission. - Prolonged QTc EF: 30-35% with diffuse hypokinesis. 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 CAD. Appears  nearly euvolemic now down around 8.9L from admission.Down - 23lbs since admission - Holding off on cardiac catheterization. Cardiology signed off (d/t waxing waning mental status) - Continue bisprolol, lisinopril, lasix  po daily (hold aldactone consider resuming if worsening CHF) - Daily wt, Strict I/O - ASA - Telemetry - Daily EKG to assess QTc (last 514 on 2/5)  Pulmonary:  Acute respiratory failure - improved previously. s/p Intubation and ICU stay Extubated 1/27. PE ruled out and CAP less likely at that time. Not requiring BIpap.  Stable CXR  from last CXR. Monitoring closely COPD: Longstanding tobacco use, and intermittent wheeze on exam. Baseline hypercarbia 2/2 COPD Pulmonary Edema: CXR consistent with volume overload and pulmonary edema in the setting of new onset decompensated CHF as above. - Monitoring respiratory status closely. Tolerating supplemental O2. Poor tolerance of BiPAP previously - Continue supplemental O2 on 3-4L [place] at 97-98% - Needs to ambulate and document O2 requirement for home supplemental O2 requirement prior to discharge. O2 [place] is likely a new baseline for him given his chronic pulmonary disease.   - Ambulation with assistance.  - Monitor VS for signs of pneumonia i.e. Fevers, WBC - CXR on 2/3 stable.   - Continue albuterol q3hr. Prn for wheezing / COPD symptoms.  - Nicotine patch for tobacco abuse.   Neuro:  Alcohol Abuse - with evidence of withdrawal on admission Korsakoff Syndrome  Psych determined patient to meet criteria for capacity to make own medical decisions on 07/31/14 - Persistent intermittent waxing / waning agitation likely in setting of known chronic alcohol abuse - Last work-up with mild elevated Ammonia 58, Head CT negative for acute changes - Risperidone 0.25mg  PO BID PRN extreme agitation (max  in 24 hours) -  Continue high dose Thiamine (completed approx 3 days of  TID) currently day 3 out of 5 for  TID (had received PO  previously) however changed to IV based on current recommendations - Resume maintenance thiamine  po daily starting 2/9 >> - Continue folate  Heme:  Thrombocytopenia - Stable to improved - Plts. Hovering in low 100's. Today improved to 111. Likely due to chronic alcoholism. - No Lovenox or heparin.  - SCD's  - Daily CBC for platelets   FEN/GI:  Protein-Calorie Malnutrition - tolerating diet. Supported by total protein of 5.5, Alb 2.4, Total Cholesterol of 97 and HDL of 12.  - Nutrition on board. Heart Healthy diet. Ensure daily.   - Saline Lock IV.   PPX: SCD's, pepcid.   Disposition: Decision for SNF placement at [facility], currently patient considered medically stable however he remains chronically ill at baseline. Once bed arrangements confirmed and home O2 req determined anticipate discharge to SNF in 1-2 days.  Subjective:  Drowsy this morning, but wakes up appropriately. Visited later in the morning and overall much more awake seems improved. Feels better. No complaints today. Tolerating oxygen.  Objective: Temp:  [97.5 F (36.4 C)-98.6 F (37 C)] 97.6 F (36.4 C) (02/06 0600) Pulse Rate:  [72-83] 72 (02/06 0600) Resp:  [18-31] 20 (02/06 0600) BP: (125-143)/(71-95) 125/71 mmHg (02/06 0600) SpO2:  [95 %-98 %] 98 % (02/06 0600) Weight:  [177 lb 14.4 oz (80.695 kg)] 177 lb 14.4 oz (80.695 kg) (02/05 1500) Physical Exam: General: AAOx3, NAD, resting initially, then sitting up in bed   Cardiovascular: RRR, S1/S2 . No murmurs Respiratory: Baseline with [place] in place. No tachypnea, [place] in place, WOB improved, Mild wheezes posteriorly with somewhat poor air movement consistent with COPD, No crackles, rales.  Abdomen: +BS, soft, NT, ND Extremities: Minimal edema of bilateral feet. Dry skin noted. without tenderness Neuro: awake, alert, grossly no other deficits  Laboratory:  Recent Labs Lab 07/31/14 0257 08/01/14 0316 08/02/14 0510  WBC 6.5 6.5 6.7  HGB  14.2 14.2 13.7  HCT 44.4 44.4 43.5  PLT 108* 108* 111*    Recent Labs Lab 07/30/14 1631 07/31/14 0257 08/01/14 0316 08/02/14 0510  NA 138 138 138 141  K 4.5 4.7 5.6* 4.3  CL 93* 95* 98 97  CO2 37* 34* 34* 38*  BUN 27* 25* 27* 25*  CREATININE 0.89 0.89 0.90 1.10  CALCIUM 9.0 8.9 9.1 9.3  PROT 6.9  --   --   --   BILITOT 1.4*  --   --   --   ALKPHOS 62  --   --   --   ALT 22  --   --   --   AST 38*  --   --   --   GLUCOSE 88 129* 82 97    Imaging/Diagnostic Tests: ECG 1/30: NSR, bifasicular block          2/1: NSR, anterior fascicular block, RBBB.            2/3: NSR RBBB, anterior fascicular block. No acute changes.     CXR 2/1 - Mild CHF. (Improvement in pulmonary edema from previous film).  CXR 2/3 - Cardiomegaly with right pleural effusion and right basilar atelectasis. No overt pulmonary edema.   CT Head 2/1:  IMPRESSION: 1. Small lacunar infarction in the right thalamus is age indeterminate. 2. Mild generalized cortical atrophy  CT Head 2/3:  Similar to the above.   Saralyn Pilar, DO  08/02/2014, 8:22 AM PGY-2, [facility] FPTS Intern pager: [phone], text pages welcome

## 2014-08-03 DIAGNOSIS — F1010 Alcohol abuse, uncomplicated: Secondary | ICD-10-CM | POA: Insufficient documentation

## 2014-08-03 DIAGNOSIS — F04 Amnestic disorder due to known physiological condition: Secondary | ICD-10-CM | POA: Insufficient documentation

## 2014-08-03 DIAGNOSIS — R9431 Abnormal electrocardiogram [ECG] [EKG]: Secondary | ICD-10-CM | POA: Insufficient documentation

## 2014-08-03 DIAGNOSIS — D696 Thrombocytopenia, unspecified: Secondary | ICD-10-CM | POA: Insufficient documentation

## 2014-08-03 DIAGNOSIS — J449 Chronic obstructive pulmonary disease, unspecified: Secondary | ICD-10-CM | POA: Insufficient documentation

## 2014-08-03 DIAGNOSIS — I4581 Long QT syndrome: Secondary | ICD-10-CM

## 2014-08-03 LAB — CBC WITH DIFFERENTIAL/PLATELET
BASOS ABS: 0 10*3/uL (ref 0.0–0.1)
BASOS PCT: 0 % (ref 0–1)
EOS PCT: 3 % (ref 0–5)
Eosinophils Absolute: 0.2 10*3/uL (ref 0.0–0.7)
HCT: 42.9 % (ref 39.0–52.0)
HEMOGLOBIN: 13.3 g/dL (ref 13.0–17.0)
LYMPHS PCT: 21 % (ref 12–46)
Lymphs Abs: 1.4 10*3/uL (ref 0.7–4.0)
MCH: 33.3 pg (ref 26.0–34.0)
MCHC: 31 g/dL (ref 30.0–36.0)
MCV: 107.5 fL — ABNORMAL HIGH (ref 78.0–100.0)
MONO ABS: 0.6 10*3/uL (ref 0.1–1.0)
MONOS PCT: 9 % (ref 3–12)
NEUTROS PCT: 67 % (ref 43–77)
Neutro Abs: 4.5 10*3/uL (ref 1.7–7.7)
Platelets: 116 10*3/uL — ABNORMAL LOW (ref 150–400)
RBC: 3.99 MIL/uL — ABNORMAL LOW (ref 4.22–5.81)
RDW: 15.4 % (ref 11.5–15.5)
WBC: 6.8 10*3/uL (ref 4.0–10.5)

## 2014-08-03 MED ORDER — VITAMIN B-1 100 MG PO TABS
100.0000 mg | ORAL_TABLET | Freq: Every day | ORAL | Status: DC
Start: 2014-08-05 — End: 2014-08-04

## 2014-08-03 MED ORDER — VITAMIN B-1 50 MG PO TABS
250.0000 mg | ORAL_TABLET | Freq: Three times a day (TID) | ORAL | Status: DC
  Administered 2014-08-03 – 2014-08-04 (×4): 250 mg via ORAL
  Filled 2014-08-03 (×6): qty 1

## 2014-08-03 NOTE — Progress Notes (Signed)
FPTS PGY-3 Interim Note  O2 sats qualifying pt for home O2: 86% while ambulating off O2, 90% off O2 at rest, 96% on 4L O2 by [place]. See full progress note 2/7 for details.  Bobbye Mortonhristopher M Gavin Faivre, MD PGY-3, [facility] 08/03/2014, 10:28 AM

## 2014-08-03 NOTE — Progress Notes (Signed)
Family Medicine Teaching Service Daily Progress Note Intern Pager: [phone]  Patient name: [name] [name] Medical record number: [id number] Date of birth: Feb 05, 1951 Age: 64 y.o. Gender: male  Primary Care Provider: No primary care provider on file. Consultants: CCM (1/26-1/29) Code Status: Full  Pt Overview and Major Events to Date:  1/25: Admitted with decompensated CHF.  1/26: Underwent EtOH withdrawal and hypercarbic respiratory failure -> ICU 1/27: Self-extubated 1/28: Diuresing (down 9L) 1/29: Transfer to floor on FMTS; transition to po lasix.  1/30 & 1/31: stable respiratory status, CIWA scores 0-3.  2/1: Cardiac catheterization weaning from O2 requirement. (unable to perform cath due to cognitive state).  2/2: Continued diuresis. Psych evaluation for competency on 2/3.  2/3: Pt. With worsening mental status and overtly drowsy this am. PCO2 found to be 73 on ABG. Transferred to stepdown for Bipap.  2/4: Patient not tolerating Bipap, pCO2 unchanged and patient tolerating [place] at this time.  2/5: Improving without bipap. Monitor o2 requirement. Improving overall with regard to mental status. 2/5: o/n required safety sitting due to attempting to remove O2 despite desat, agitated, improved w/ ativan 2/6: continued safety sitter, reduced agitation and cooperative with O2 2/7: confusion reduced, pt much more calm  Assessment and Plan: 64 yo male admitted for SOB and leg swelling found to have newly diagnosed decompensated CHF resulting in acute hypercarbic respiratory failure in the setting of known ETOH abuse and CAD risk factors.   Cardiac:  Acute Systolic Heart Failure - Newly Diagnosed. 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 CAD. - Appears relatively euvolemic now down around 10 L from admission. Demand Cardiac Ischemia - mild troponin elevation and subsequent plateau at 0.2.  Arrhythmia on admission - p  waves present unlikely afib. No further arrhythmias during this admission. Prolonged QTc - Cardiology signed off (d/t waxing waning mental status), recommending medical management; attempted cath 2/1 but canceled due to confusion and pt felt to be poor candidate for PCI / DAPT given comorbidities and chronic thrombocytopenia - Continue ASA, bisprolol, lisinopril, Lasix 40 mg po daily (stopped Aldactone; consider resuming if worsening CHF-type symptoms) - monitor weights, I&O, continue telemetry and monitor QTc  Pulmonary:  Acute respiratory failure - Now improved. S/p Intubation and ICU stay Extubated 1/27. PE ruled out and CAP less likely at that time. COPD: Longstanding tobacco use, and intermittent wheeze on exam. Baseline hypercarbia 2/2 COPD Pulmonary Edema: initial CXR consistent with volume overload and pulmonary edema in the setting of new onset decompensated CHF as above. Improved as of 2/3 - Monitoring respiratory status closely. Tolerating supplemental O2. Poor tolerance of BiPAP previously - Continue supplemental O2 on 3-4L [place]; will need to continue after discharge (to home or SNF) - Ambulation with assistance.  - Monitor VS for signs of pneumonia i.e., fevers, WBC - Continue albuterol q3h PRN for wheezing / COPD symptoms.  - Nicotine patch for tobacco abuse.   Neuro:  Alcohol Abuse - with evidence of withdrawal on admission Korsakoff Syndrome Capacity - psychiatry assessment 2/4 deemed pt to have capacity to make his own medical decisions -  intermittent waxing / waning agitation likely in setting of known chronic alcohol abuse - Last work-up with mild elevated Ammonia 58, Head CT negative for acute changes - continue risperidone 0.25mg  PO BID PRN extreme agitation (max 1mg  in 24 hours) - Continue high dose Thiamine (completed approx 3 days of 500mg  TID) currently day 4 out of 5 for 250mg  TID (  had received PO previously) - Resume maintenance thiamine  po daily starting  2/9 - Continue folate daily  Heme:  Thrombocytopenia - Stable, slightly improved. Likely due to chronic alcoholism. - No Lovenox or heparin.  - SCD's  - monitor platelet trend  FEN/GI:  Protein-Calorie Malnutrition - tolerating diet. Supported by total protein of 5.5, Alb 2.4, Total Cholesterol of 97 and HDL of 12.  - appreciate Nutrition rec's. Heart Healthy diet. Ensure daily.  - Saline Lock IV.   PPX: SCD's, pepcid.   Disposition: Mangement as above. Possible D/C to [facility] later today pending clarification through CSW about ability of facility to accept him, today  Subjective:  Pt reports he feels "fine" and is "ready to go home." Pt initially ambivalent about discharge to SNF but then agreeable for short-term. Denies chest pain, SOB, headache, N/V and states his breathing and appetite are "fine."  Objective: Temp:  [97.3 F (36.3 C)-97.7 F (36.5 C)] 97.6 F (36.4 C) (02/07 0647) Pulse Rate:  [77-82] 79 (02/07 0647) Resp:  [16-20] 20 (02/07 0647) BP: (112-120)/(65-69) 113/69 mmHg (02/07 0647) SpO2:  [94 %-97 %] 96 % (02/07 0937) Physical Exam: General: adult male lying in bed in NAD Cardiovascular: RRR, S1/S2 . No murmur appreciated Respiratory: Baseline with [place] in place. No tachypnea, minimal wheezes, no crackles, rales.  Abdomen: soft, nontender, BS+ Extremities: Dry skin but no frank rashes Neuro: awake, alert / conversant, grossly no other deficits  Laboratory:  Recent Labs Lab 08/01/14 0316 08/02/14 0510 08/03/14 0635  WBC 6.5 6.7 6.8  HGB 14.2 13.7 13.3  HCT 44.4 43.5 42.9  PLT 108* 111* 116*    Recent Labs Lab 07/30/14 1631 07/31/14 0257 08/01/14 0316 08/02/14 0510  NA 138 138 138 141  K 4.5 4.7 5.6* 4.3  CL 93* 95* 98 97  CO2 37* 34* 34* 38*  BUN 27* 25* 27* 25*  CREATININE 0.89 0.89 0.90 1.10  CALCIUM 9.0 8.9 9.1 9.3  PROT 6.9  --   --   --   BILITOT 1.4*  --   --   --   ALKPHOS 62  --   --   --   ALT 22  --   --   --    AST 38*  --   --   --   GLUCOSE 88 129* 82 97    Imaging/Diagnostic Tests: ECG 1/30: NSR, bifasicular block  2/1: NSR, anterior fascicular block, RBBB.   2/3: NSR RBBB, anterior fascicular block. No acute changes.     CXR 2/1 - Mild CHF. (Improvement in pulmonary edema from previous film).  CXR 2/3 - Cardiomegaly with right pleural effusion and right basilar atelectasis. No overt pulmonary edema.  CT Head 2/1:  IMPRESSION: 1. Small lacunar infarction in the right thalamus is age indeterminate. 2. Mild generalized cortical atrophy  CT Head 2/3:  Similar to the above.   Stephanie Coup Josemaria Brining, MD 08/03/2014, 10:21 AM PGY-3, [facility] FPTS Intern pager: [phone], text pages welcome

## 2014-08-03 NOTE — Progress Notes (Signed)
Facilitated signing of healthcare power of attorney and living will with pt. And his brother.  They had diuscussed the pamphlet at length and were ready to sign/initial all the documents.  We gathered witnesses and notary and completed the process.  Rev. [place], [place]Cha[place] [phone]

## 2014-08-04 LAB — CBC WITH DIFFERENTIAL/PLATELET
Basophils Absolute: 0 10*3/uL (ref 0.0–0.1)
Basophils Relative: 1 % (ref 0–1)
Eosinophils Absolute: 0.1 10*3/uL (ref 0.0–0.7)
Eosinophils Relative: 2 % (ref 0–5)
HEMATOCRIT: 43.8 % (ref 39.0–52.0)
Hemoglobin: 13.9 g/dL (ref 13.0–17.0)
Lymphocytes Relative: 21 % (ref 12–46)
Lymphs Abs: 1.3 10*3/uL (ref 0.7–4.0)
MCH: 33.3 pg (ref 26.0–34.0)
MCHC: 31.7 g/dL (ref 30.0–36.0)
MCV: 104.8 fL — ABNORMAL HIGH (ref 78.0–100.0)
MONOS PCT: 10 % (ref 3–12)
Monocytes Absolute: 0.7 10*3/uL (ref 0.1–1.0)
Neutro Abs: 4.3 10*3/uL (ref 1.7–7.7)
Neutrophils Relative %: 66 % (ref 43–77)
PLATELETS: 128 10*3/uL — AB (ref 150–400)
RBC: 4.18 MIL/uL — ABNORMAL LOW (ref 4.22–5.81)
RDW: 15.3 % (ref 11.5–15.5)
WBC: 6.4 10*3/uL (ref 4.0–10.5)

## 2014-08-04 MED ORDER — BISOPROLOL FUMARATE 5 MG PO TABS: 5.0000 mg | ORAL_TABLET | Freq: Every day | ORAL | Status: DC

## 2014-08-04 MED ORDER — LISINOPRIL 5 MG PO TABS
5.0000 mg | ORAL_TABLET | Freq: Every day | ORAL | Status: DC
Start: 2014-08-04 — End: 2014-08-12

## 2014-08-04 MED ORDER — FOLIC ACID 1 MG PO TABS: 1.0000 mg | ORAL_TABLET | Freq: Every day | ORAL | Status: DC

## 2014-08-04 MED ORDER — ATORVASTATIN CALCIUM 40 MG PO TABS: 40.0000 mg | ORAL_TABLET | Freq: Every day | ORAL | Status: DC

## 2014-08-04 MED ORDER — FUROSEMIDE 40 MG PO TABS
40.0000 mg | ORAL_TABLET | Freq: Every day | ORAL | Status: DC
Start: 2014-08-04 — End: 2014-08-12

## 2014-08-04 MED ORDER — THIAMINE HCL 100 MG PO TABS: 100.0000 mg | ORAL_TABLET | Freq: Every day | ORAL | Status: DC

## 2014-08-04 NOTE — Progress Notes (Signed)
Pt. States that his brother will be here to pick him up at 5pm.

## 2014-08-04 NOTE — Progress Notes (Signed)
Pt. Received discharge instructions and prescriptions. Pt. Brother at bedside. Educated pt. On follow-up appointments. Removed IV. No NEW skin issues noted. All questions answered. No further needs noted at this time.

## 2014-08-04 NOTE — Progress Notes (Signed)
ED CM [facility] [address] [facility] of [facility] Letter. Clarification of information resubmitted to PDMI. MATCH reprocessed, no further CM needs identified

## 2014-08-04 NOTE — Clinical Social Work Note (Signed)
Patient's brother unable to get patient to agree to placement. Patient's brother states he will transport patient home if patient calls to ask for the ride. If not, patient will need to find transportation elsewhere.   Roddie McBryant Katina Remick MSW, [place], [place], [phone]

## 2014-08-04 NOTE — Clinical Social Work Note (Signed)
Voicemail left with patient's brother. Patient stating he wants to return home instead of going to SNF. Hopefully brother [name] will [facility] the [facility]   Roddie McBryant Arella Blinder MSW, [place], [place], [phone]

## 2014-08-04 NOTE — Discharge Instructions (Signed)
Patient admitted with acute decompensated CHF EF 30-35%. Needs ongoing daily diuresis, B-Blocker, and Ace.   ETOH Withdrawal - needs ongoing vitamin supplementation thiamin, folate. Not scoring on CIWA at discharge.   See discharge specific instructions in the discharge summary

## 2014-08-04 NOTE — Progress Notes (Addendum)
SATURATION QUALIFICATIONS: (This note is used to comply with regulatory documentation for home oxygen)  Patient Saturations on Room Air at Rest = 92%  Patient Saturations on Room Air while Ambulating = 83%  Patient Saturations on 2 Liters of oxygen while Ambulating = 93%  Please briefly explain why patient needs home oxygen: Pt. desats to low 80's while ambulating.

## 2014-08-12 ENCOUNTER — Encounter: Payer: Self-pay | Admitting: Internal Medicine

## 2014-08-12 ENCOUNTER — Ambulatory Visit: Payer: Self-pay | Attending: Physician Assistant | Admitting: Internal Medicine

## 2014-08-12 VITALS — BP 111/69 | HR 81 | Temp 98.7°F | Resp 14 | Ht 72.0 in | Wt 177.0 lb

## 2014-08-12 DIAGNOSIS — F17211 Nicotine dependence, cigarettes, in remission: Secondary | ICD-10-CM

## 2014-08-12 DIAGNOSIS — I5021 Acute systolic (congestive) heart failure: Secondary | ICD-10-CM | POA: Insufficient documentation

## 2014-08-12 DIAGNOSIS — Z8673 Personal history of transient ischemic attack (TIA), and cerebral infarction without residual deficits: Secondary | ICD-10-CM | POA: Insufficient documentation

## 2014-08-12 DIAGNOSIS — Z9981 Dependence on supplemental oxygen: Secondary | ICD-10-CM | POA: Insufficient documentation

## 2014-08-12 DIAGNOSIS — J449 Chronic obstructive pulmonary disease, unspecified: Secondary | ICD-10-CM | POA: Insufficient documentation

## 2014-08-12 DIAGNOSIS — IMO0001 Reserved for inherently not codable concepts without codable children: Secondary | ICD-10-CM

## 2014-08-12 DIAGNOSIS — Z7982 Long term (current) use of aspirin: Secondary | ICD-10-CM | POA: Insufficient documentation

## 2014-08-12 DIAGNOSIS — Z87891 Personal history of nicotine dependence: Secondary | ICD-10-CM | POA: Insufficient documentation

## 2014-08-12 DIAGNOSIS — F1010 Alcohol abuse, uncomplicated: Secondary | ICD-10-CM | POA: Insufficient documentation

## 2014-08-12 DIAGNOSIS — E785 Hyperlipidemia, unspecified: Secondary | ICD-10-CM | POA: Insufficient documentation

## 2014-08-12 LAB — COMPLETE METABOLIC PANEL WITH GFR
ALK PHOS: 88 U/L (ref 39–117)
ALT: 23 U/L (ref 0–53)
AST: 28 U/L (ref 0–37)
Albumin: 3.1 g/dL — ABNORMAL LOW (ref 3.5–5.2)
BUN: 11 mg/dL (ref 6–23)
CALCIUM: 9.1 mg/dL (ref 8.4–10.5)
CHLORIDE: 95 meq/L — AB (ref 96–112)
CO2: 35 mEq/L — ABNORMAL HIGH (ref 19–32)
CREATININE: 0.57 mg/dL (ref 0.50–1.35)
GFR, Est African American: >89 mL/min
GFR, Est Non African American: >89 mL/min
Glucose, Bld: 78 mg/dL (ref 70–99)
POTASSIUM: 4.2 meq/L (ref 3.5–5.3)
Sodium: 139 mEq/L (ref 135–145)
Total Bilirubin: 0.9 mg/dL (ref 0.2–1.2)
Total Protein: 6.8 g/dL (ref 6.0–8.3)

## 2014-08-12 MED ORDER — NICOTINE 14 MG/24HR TD PT24: 14.0000 mg | MEDICATED_PATCH | Freq: Every day | TRANSDERMAL | Status: DC

## 2014-08-12 MED ORDER — FUROSEMIDE 40 MG PO TABS: 40.0000 mg | ORAL_TABLET | Freq: Every day | ORAL | Status: DC

## 2014-08-12 MED ORDER — BISOPROLOL FUMARATE 5 MG PO TABS: 5.0000 mg | ORAL_TABLET | Freq: Every day | ORAL | Status: DC

## 2014-08-12 MED ORDER — LISINOPRIL 5 MG PO TABS: 5.0000 mg | ORAL_TABLET | Freq: Every day | ORAL | Status: DC

## 2014-08-12 MED ORDER — ATORVASTATIN CALCIUM 40 MG PO TABS: 40.0000 mg | ORAL_TABLET | Freq: Every day | ORAL | Status: DC

## 2014-08-12 MED ORDER — THIAMINE HCL 100 MG PO TABS: 100.0000 mg | ORAL_TABLET | Freq: Every day | ORAL | Status: DC

## 2014-08-12 MED ORDER — FOLIC ACID 1 MG PO TABS: 1.0000 mg | ORAL_TABLET | Freq: Every day | ORAL | Status: DC

## 2014-08-12 NOTE — Progress Notes (Signed)
Patient ID: [name] [name], male   DOB: 07-14-1950, 64 y.o.   MRN: [id number]   [name] [name], is a 64 y.o. male  [id number]  [id number]  DOB - [birth year]  CC:  Chief Complaint  Patient presents with  . Establish Care  . Hospitalization Follow-up       HPI: [name] [name] is a 64 y.o. male here today to establish medical care. Patient was recently admitted to the hospital for dyspnea and pedal edema, found to be in CHF with ejection fraction of 30-35% by echocardiogram. Patient was found to have hypoxemia, was intubated and mechanically ventilated, a few days later patient self extubated and did well on BiPAP, and then oxygen by nasal cannula. Patient was suspected to have alcoholic cardiomyopathy as a major contributor to his left ventricular dysfunction. Patient was scheduled for a heart Cath in the hospital but was not completed because of his confusional state, he was unable to give consent. Also during his admission, patient had altered mental status and evidence of alcohol withdrawal, was placed on CIWA protocol and also evaluated by psychiatrist. Prior to this time patient had no significant past medical history, he was a heavy smoker and a heavy drinker, he said he has not had a drink in about 3 weeks and has not smoked around the same period of time, requesting nicotine patch today. Patient is here today as hospital follow-up, has no new complaint, was discharged home on home oxygen. He claims to be doing well, edema has subsided, shortness of breath is improved, he continues to use oxygen. Patient has No headache, No chest pain, No abdominal pain - No Nausea, No new weakness tingling or numbness, No Cough - SOB. Patient will need a colonoscopy down the road. Patient is currently on aspirin, beta blocker, Lasix, ACE inhibitor and a statin.  Allergies  Allergen Reactions  . Levaquin [Levofloxacin In D5w]     Generalized redness; received azithromycin, rocephin, and levaquin on the same day as  reaction developed.   Past Medical History  Diagnosis Date  . Heavy smoker   . CHF (congestive heart failure)   . Anxiety   . Shortness of breath dyspnea   . GERD (gastroesophageal reflux disease)   . Stroke     a. Noted on CT 07/29/14   No current outpatient prescriptions on file prior to visit.   No current facility-administered medications on file prior to visit.   History reviewed. No pertinent family history. History   Social History  . Marital Status: Single    Spouse Name: N/A  . Number of Children: N/A  . Years of Education: N/A   Occupational History  . Not on file.   Social History Main Topics  . Smoking status: Former Smoker    Types: Cigarettes    Quit date: 07/22/2014  . Smokeless tobacco: Not on file  . Alcohol Use: 105.0 oz/week    175 Cans of beer per week  . Drug Use: No  . Sexual Activity: Not Currently   Other Topics Concern  . Not on file   Social History Narrative    Review of Systems: Constitutional: Negative for fever, chills, diaphoresis, activity change, appetite change and fatigue. HENT: Negative for ear pain, nosebleeds, congestion, facial swelling, rhinorrhea, neck pain, neck stiffness and ear discharge.  Eyes: Negative for pain, discharge, redness, itching and visual disturbance. Respiratory: Shortness of breath on home oxygen as above Gastrointestinal: Negative for abdominal distention. Genitourinary: Negative for dysuria, urgency, frequency, hematuria, flank pain,  decreased urine volume, difficulty urinating and dyspareunia.  Musculoskeletal: Negative for back pain, joint swelling, arthralgia and gait problem. Neurological: Negative for dizziness, tremors, seizures, syncope, facial asymmetry, speech difficulty, weakness, light-headedness, numbness and headaches.  Hematological: Negative for adenopathy. Does not bruise/bleed easily. Psychiatric/Behavioral: Negative for hallucinations, behavioral problems, confusion, dysphoric mood,  decreased concentration and agitation.    Objective:   Filed Vitals:   08/12/14 0918  BP: 111/69  Pulse: 81  Temp: 98.7 F (37.1 C)  Resp: 14    Physical Exam: Constitutional: Patient appears older than his age. No distress. HENT: Normocephalic, atraumatic, External right and left ear normal. Oropharynx is clear and moist.  Eyes: Conjunctivae and EOM are normal. PERRLA, no scleral icterus. Neck: Normal ROM. Neck supple. No JVD. No tracheal deviation. No thyromegaly. CVS: RRR, S1/S2 +, no murmurs, no gallops, no carotid bruit.  Pulmonary: Effort and breath sounds normal, no stridor, rhonchi, wheezes, rales.  Abdominal: Soft. BS +, no distension, tenderness, rebound or guarding.  Musculoskeletal: Normal range of motion. No edema and no tenderness.  Lymphadenopathy: No lymphadenopathy noted, cervical, inguinal or axillary Neuro: Alert. Normal reflexes, muscle tone coordination. No cranial nerve deficit. Skin: Skin is warm and dry. No rash noted. Not diaphoretic. No erythema. No pallor. Psychiatric: Normal mood and affect. Behavior, judgment, thought content normal.  Lab Results  Component Value Date   WBC 6.4 08/04/2014   HGB 13.9 08/04/2014   HCT 43.8 08/04/2014   MCV 104.8* 08/04/2014   PLT 128* 08/04/2014   Lab Results  Component Value Date   CREATININE 1.10 08/02/2014   BUN 25* 08/02/2014   NA 141 08/02/2014   K 4.3 08/02/2014   CL 97 08/02/2014   CO2 38* 08/02/2014    Lab Results  Component Value Date   HGBA1C 6.1* 07/22/2014   Lipid Panel     Component Value Date/Time   CHOL 97 07/22/2014 0530   TRIG 83 07/22/2014 0530   HDL 12* 07/22/2014 0530   CHOLHDL 8.1 07/22/2014 0530   VLDL 17 07/22/2014 0530   LDLCALC 68 07/22/2014 0530       Assessment and plan:   1. Acute systolic congestive heart failure  - lisinopril (PRINIVIL,ZESTRIL) 5 MG tablet; Take 1 tablet (5 mg total) by mouth daily.  Dispense: 90 tablet; Refill: 3 - furosemide (LASIX) 40 MG  tablet; Take 1 tablet (40 mg total) by mouth daily.  Dispense: 90 tablet; Refill: 3 - bisoprolol (ZEBETA) 5 MG tablet; Take 1 tablet (5 mg total) by mouth daily.  Dispense: 90 tablet; Refill: 3 - COMPLETE METABOLIC PANEL WITH GFR - Follow up with cardiologist - DASH diet  2. COPD bronchitis Continue home oxygen use Follow-up with pulmonologist  3. Alcohol abuse  - thiamine 100 MG tablet; Take 1 tablet (100 mg total) by mouth daily.  Dispense: 90 tablet; Refill: 3 - folic acid (FOLVITE) 1 MG tablet; Take 1 tablet (1 mg total) by mouth daily.  Dispense: 90 tablet; Refill: 3  4. Cigarette nicotine dependence in remission  - nicotine (NICODERM CQ - DOSED IN MG/24 HOURS) 14 mg/24hr patch; Place 1 patch (14 mg total) onto the skin daily.  Dispense: 28 patch; Refill: 0  5. Dyslipidemia  - atorvastatin (LIPITOR) 40 MG tablet; Take 1 tablet (40 mg total) by mouth daily.  Dispense: 90 tablet; Refill: 3  Patient has been counseled extensively about smoking cessation Patient is also advised to continue abstinence from alcohol Return in about 4 weeks (around 09/09/2014), or if symptoms  worsen or fail to improve, for Heart Failure and Hypertension, Annual Physical.  The patient was given clear instructions to go to ER or return to medical center if symptoms don't improve, worsen or new problems develop. The patient verbalized understanding. The patient was told to call to get lab results if they haven't heard anything in the next week.     This note has been created with Education officer, environmental. Any transcriptional errors are unintentional.    Jeanann Lewandowsky, MD, MHA, FACP, FAAP [facility] [place], [place] [phone]   08/12/2014, 10:28 AM

## 2014-08-12 NOTE — Progress Notes (Signed)
HFU Pt is here to further manage his COPD, anxiety, dyspnea, SOB and his CHF. Today pt has no C.C.

## 2014-08-12 NOTE — Patient Instructions (Signed)
Chronic Obstructive Pulmonary Disease Chronic obstructive pulmonary disease (COPD) is a common lung condition in which airflow from the lungs is limited. COPD is a general term that can be used to describe many different lung problems that limit airflow, including both chronic bronchitis and emphysema. If you have COPD, your lung function will probably never return to normal, but there are measures you can take to improve lung function and make yourself feel better.  CAUSES   Smoking (common).   Exposure to secondhand smoke.   Genetic problems.  Chronic inflammatory lung diseases or recurrent infections. SYMPTOMS   Shortness of breath, especially with physical activity.   Deep, persistent (chronic) cough with a large amount of thick mucus.   Wheezing.   Rapid breaths (tachypnea).   Gray or bluish discoloration (cyanosis) of the skin, especially in fingers, toes, or lips.   Fatigue.   Weight loss.   Frequent infections or episodes when breathing symptoms become much worse (exacerbations).   Chest tightness. DIAGNOSIS  Your health care provider will take a medical history and perform a physical examination to make the initial diagnosis. Additional tests for COPD may include:   Lung (pulmonary) function tests.  Chest X-ray.  CT scan.  Blood tests. TREATMENT  Treatment available to help you feel better when you have COPD includes:   Inhaler and nebulizer medicines. These help manage the symptoms of COPD and make your breathing more comfortable.  Supplemental oxygen. Supplemental oxygen is only helpful if you have a low oxygen level in your blood.   Exercise and physical activity. These are beneficial for nearly all people with COPD. Some people may also benefit from a pulmonary rehabilitation program. HOME CARE INSTRUCTIONS   Take all medicines (inhaled or pills) as directed by your health care provider.  Avoid over-the-counter medicines or cough syrups  that dry up your airway (such as antihistamines) and slow down the elimination of secretions unless instructed otherwise by your health care provider.   If you are a smoker, the most important thing that you can do is stop smoking. Continuing to smoke will cause further lung damage and breathing trouble. Ask your health care provider for help with quitting smoking. He or she can direct you to community resources or hospitals that provide support.  Avoid exposure to irritants such as smoke, chemicals, and fumes that aggravate your breathing.  Use oxygen therapy and pulmonary rehabilitation if directed by your health care provider. If you require home oxygen therapy, ask your health care provider whether you should purchase a pulse oximeter to measure your oxygen level at home.   Avoid contact with individuals who have a contagious illness.  Avoid extreme temperature and humidity changes.  Eat healthy foods. Eating smaller, more frequent meals and resting before meals may help you maintain your strength.  Stay active, but balance activity with periods of rest. Exercise and physical activity will help you maintain your ability to do things you want to do.  Preventing infection and hospitalization is very important when you have COPD. Make sure to receive all the vaccines your health care provider recommends, especially the pneumococcal and influenza vaccines. Ask your health care provider whether you need a pneumonia vaccine.  Learn and use relaxation techniques to manage stress.  Learn and use controlled breathing techniques as directed by your health care provider. Controlled breathing techniques include:   Pursed lip breathing. Start by breathing in (inhaling) through your nose for 1 second. Then, purse your lips as if you were   going to whistle and breathe out (exhale) through the pursed lips for 2 seconds.   Diaphragmatic breathing. Start by putting one hand on your abdomen just above  your waist. Inhale slowly through your nose. The hand on your abdomen should move out. Then purse your lips and exhale slowly. You should be able to feel the hand on your abdomen moving in as you exhale.   Learn and use controlled coughing to clear mucus from your lungs. Controlled coughing is a series of short, progressive coughs. The steps of controlled coughing are:  1. Lean your head slightly forward.  2. Breathe in deeply using diaphragmatic breathing.  3. Try to hold your breath for 3 seconds.  4. Keep your mouth slightly open while coughing twice.  5. Spit any mucus out into a tissue.  6. Rest and repeat the steps once or twice as needed. SEEK MEDICAL CARE IF:   You are coughing up more mucus than usual.   There is a change in the color or thickness of your mucus.   Your breathing is more labored than usual.   Your breathing is faster than usual.  SEEK IMMEDIATE MEDICAL CARE IF:   You have shortness of breath while you are resting.   You have shortness of breath that prevents you from:  Being able to talk.   Performing your usual physical activities.   You have chest pain lasting longer than 5 minutes.   Your skin color is more cyanotic than usual.  You measure low oxygen saturations for longer than 5 minutes with a pulse oximeter. MAKE SURE YOU:   Understand these instructions.  Will watch your condition.  Will get help right away if you are not doing well or get worse. Document Released: 03/23/2005 Document Revised: 10/28/2013 Document Reviewed: 02/07/2013 [facility] Patient Information 2015 [place], [place]. This information is not intended to replace advice given to you by your health care provider. Make sure you discuss any questions you have with your health care provider. Smoking Cessation Quitting smoking is important to your health and has many advantages. However, it is not always easy to quit since nicotine is a very addictive drug. Oftentimes,  people try 3 times or more before being able to quit. This document explains the best ways for you to prepare to quit smoking. Quitting takes hard work and a lot of effort, but you can do it. ADVANTAGES OF QUITTING SMOKING  You will live longer, feel better, and live better.  Your body will feel the impact of quitting smoking almost immediately.  Within 20 minutes, blood pressure decreases. Your pulse returns to its normal level.  After 8 hours, carbon monoxide levels in the blood return to normal. Your oxygen level increases.  After 24 hours, the chance of having a heart attack starts to decrease. Your breath, hair, and body stop smelling like smoke.  After 48 hours, damaged nerve endings begin to recover. Your sense of taste and smell improve.  After 72 hours, the body is virtually free of nicotine. Your bronchial tubes relax and breathing becomes easier.  After 2 to 12 weeks, lungs can hold more air. Exercise becomes easier and circulation improves.  The risk of having a heart attack, stroke, cancer, or lung disease is greatly reduced.  After 1 year, the risk of coronary heart disease is cut in half.  After 5 years, the risk of stroke falls to the same as a nonsmoker.  After 10 years, the risk of lung cancer is  cut in half and the risk of other cancers decreases significantly.  After 15 years, the risk of coronary heart disease drops, usually to the level of a nonsmoker.  If you are pregnant, quitting smoking will improve your chances of having a healthy baby.  The people you live with, especially any children, will be healthier.  You will have extra money to spend on things other than cigarettes. QUESTIONS TO THINK ABOUT BEFORE ATTEMPTING TO QUIT You may want to talk about your answers with your health care provider.  Why do you want to quit?  If you tried to quit in the past, what helped and what did not?  What will be the most difficult situations for you after you  quit? How will you plan to handle them?  Who can help you through the tough times? Your family? Friends? A health care provider?  What pleasures do you get from smoking? What ways can you still get pleasure if you quit? Here are some questions to ask your health care provider:  How can you help me to be successful at quitting?  What medicine do you think would be best for me and how should I take it?  What should I do if I need more help?  What is smoking withdrawal like? How can I get information on withdrawal? GET READY  Set a quit date.  Change your environment by getting rid of all cigarettes, ashtrays, matches, and lighters in your home, car, or work. Do not let people smoke in your home.  Review your past attempts to quit. Think about what worked and what did not. GET SUPPORT AND ENCOURAGEMENT You have a better chance of being successful if you have help. You can get support in many ways.  Tell your family, friends, and coworkers that you are going to quit and need their support. Ask them not to smoke around you.  Get individual, group, or telephone counseling and support. Programs are available at [employer] and health centers. Call your local health department for information about programs in your area.  Spiritual beliefs and practices may help some smokers quit.  Download a "quit meter" on your computer to keep track of quit statistics, such as how long you have gone without smoking, cigarettes not smoked, and money saved.  Get a self-help book about quitting smoking and staying off tobacco. [place] 7. Distract yourself from urges to smoke. Talk to someone, go for a walk, or occupy your time with a task. 8. Change your normal routine. Take a different route to work. Drink tea instead of coffee. Eat breakfast in a different place. 9. Reduce your stress. Take a hot bath, exercise, or read a book. 10. Plan something enjoyable to do every day.  Reward yourself for not smoking. 11. Explore interactive web-based programs that specialize in helping you quit. GET MEDICINE AND USE IT CORRECTLY Medicines can help you stop smoking and decrease the urge to smoke. Combining medicine with the above behavioral methods and support can greatly increase your chances of successfully quitting smoking.  Nicotine replacement therapy helps deliver nicotine to your body without the negative effects and risks of smoking. Nicotine replacement therapy includes nicotine gum, lozenges, inhalers, nasal sprays, and skin patches. Some may be available over-the-counter and others require a prescription.  Antidepressant medicine helps people abstain from smoking, but how this works is unknown. This medicine is available by prescription.  Nicotinic receptor partial agonist medicine simulates the effect of  nicotine in your brain. This medicine is available by prescription. Ask your health care provider for advice about which medicines to use and how to use them based on your health history. Your health care provider will tell you what side effects to look out for if you choose to be on a medicine or therapy. Carefully read the information on the package. Do not use any other product containing nicotine while using a nicotine replacement product.  RELAPSE OR DIFFICULT SITUATIONS Most relapses occur within the first 3 months after quitting. Do not be discouraged if you start smoking again. Remember, most people try several times before finally quitting. You may have symptoms of withdrawal because your body is used to nicotine. You may crave cigarettes, be irritable, feel very hungry, cough often, get headaches, or have difficulty concentrating. The withdrawal symptoms are only temporary. They are strongest when you first quit, but they will go away within 10-14 days. To reduce the chances of relapse, try to:  Avoid drinking alcohol. Drinking lowers your chances of  successfully quitting.  Reduce the amount of caffeine you consume. Once you quit smoking, the amount of caffeine in your body increases and can give you symptoms, such as a rapid heartbeat, sweating, and anxiety.  Avoid smokers because they can make you want to smoke.  Do not let weight gain distract you. Many smokers will gain weight when they quit, usually less than 10 pounds. Eat a healthy diet and stay active. You can always lose the weight gained after you quit.  Find ways to improve your mood other than smoking. FOR MORE INFORMATION  www.smokefree.gov  Document Released: 06/07/2001 Document Revised: 10/28/2013 Document Reviewed: 09/22/2011 [facility] Patient Information 2015 [place]. This information is not intended to replace advice given to you by your health care provider. Make sure you discuss any questions you have with your health care provider. Heart Failure Heart failure is a condition in which the heart has trouble pumping blood. This means your heart does not pump blood efficiently for your body to work well. In some cases of heart failure, fluid may back up into your lungs or you may have swelling (edema) in your lower legs. Heart failure is usually a long-term (chronic) condition. It is important for you to take good care of yourself and follow your health care provider's treatment plan. CAUSES  Some health conditions can cause heart failure. Those health conditions include:  High blood pressure (hypertension). Hypertension causes the heart muscle to work harder than normal. When pressure in the blood vessels is high, the heart needs to pump (contract) with more force in order to circulate blood throughout the body. High blood pressure eventually causes the heart to become stiff and weak.  Coronary artery disease (CAD). CAD is the buildup of cholesterol and fat (plaque) in the arteries of the heart. The blockage in the arteries deprives the heart muscle of oxygen and  blood. This can cause chest pain and may lead to a heart attack. High blood pressure can also contribute to CAD.  Heart attack (myocardial infarction). A heart attack occurs when one or more arteries in the heart become blocked. The loss of oxygen damages the muscle tissue of the heart. When this happens, part of the heart muscle dies. The injured tissue does not contract as well and weakens the heart's ability to pump blood.  Abnormal heart valves. When the heart valves do not open and close properly, it can cause heart failure. This makes the heart  muscle pump harder to keep the blood flowing.  Heart muscle disease (cardiomyopathy or myocarditis). Heart muscle disease is damage to the heart muscle from a variety of causes. These can include drug or alcohol abuse, infections, or unknown reasons. These can increase the risk of heart failure.  Lung disease. Lung disease makes the heart work harder because the lungs do not work properly. This can cause a strain on the heart, leading it to fail.  Diabetes. Diabetes increases the risk of heart failure. High blood sugar contributes to high fat (lipid) levels in the blood. Diabetes can also cause slow damage to tiny blood vessels that carry important nutrients to the heart muscle. When the heart does not get enough oxygen and food, it can cause the heart to become weak and stiff. This leads to a heart that does not contract efficiently.  Other conditions can contribute to heart failure. These include abnormal heart rhythms, thyroid problems, and low blood counts (anemia). Certain unhealthy behaviors can increase the risk of heart failure, including:  Being overweight.  Smoking or chewing tobacco.  Eating foods high in fat and cholesterol.  Abusing illicit drugs or alcohol.  Lacking physical activity. SYMPTOMS  Heart failure symptoms may vary and can be hard to detect. Symptoms may include:  Shortness of breath with activity, such as climbing  stairs.  Persistent cough.  Swelling of the feet, ankles, legs, or abdomen.  Unexplained weight gain.  Difficulty breathing when lying flat (orthopnea).  Waking from sleep because of the need to sit up and get more air.  Rapid heartbeat.  Fatigue and loss of energy.  Feeling light-headed, dizzy, or close to fainting.  Loss of appetite.  Nausea.  Increased urination during the night (nocturia). DIAGNOSIS  A diagnosis of heart failure is based on your history, symptoms, physical examination, and diagnostic tests. Diagnostic tests for heart failure may include:  Echocardiography.  Electrocardiography.  Chest X-ray.  Blood tests.  Exercise stress test.  Cardiac angiography.  Radionuclide scans. TREATMENT  Treatment is aimed at managing the symptoms of heart failure. Medicines, behavioral changes, or surgical intervention may be necessary to treat heart failure.  Medicines to help treat heart failure may include:  Angiotensin-converting enzyme (ACE) inhibitors. This type of medicine blocks the effects of a blood protein called angiotensin-converting enzyme. ACE inhibitors relax (dilate) the blood vessels and help lower blood pressure.  Angiotensin receptor blockers (ARBs). This type of medicine blocks the actions of a blood protein called angiotensin. Angiotensin receptor blockers dilate the blood vessels and help lower blood pressure.  Water pills (diuretics). Diuretics cause the kidneys to remove salt and water from the blood. The extra fluid is removed through urination. This loss of extra fluid lowers the volume of blood the heart pumps.  Beta blockers. These prevent the heart from beating too fast and improve heart muscle strength.  Digitalis. This increases the force of the heartbeat.  Healthy behavior changes include:  Obtaining and maintaining a healthy weight.  Stopping smoking or chewing tobacco.  Eating heart-healthy foods.  Limiting or avoiding  alcohol.  Stopping illicit drug use.  Physical activity as directed by your health care provider.  Surgical treatment for heart failure may include:  A procedure to open blocked arteries, repair damaged heart valves, or remove damaged heart muscle tissue.  A pacemaker to improve heart muscle function and control certain abnormal heart rhythms.  An internal cardioverter defibrillator to treat certain serious abnormal heart rhythms.  A left ventricular assist device (LVAD)  to assist the pumping ability of the heart. HOME CARE INSTRUCTIONS  12. Take medicines only as directed by your health care provider. Medicines are important in reducing the workload of your heart, slowing the progression of heart failure, and improving your symptoms. 1. Do not stop taking your medicine unless directed by your health care provider. 2. Do not skip any dose of medicine. 3. Refill your prescriptions before you run out of medicine. Your medicines are needed every day. 13. Engage in moderate physical activity if directed by your health care provider. Moderate physical activity can benefit some people. The elderly and people with severe heart failure should consult with a health care provider for physical activity recommendations. 14. Eat heart-healthy foods. Food choices should be free of trans fat and low in saturated fat, cholesterol, and salt (sodium). Healthy choices include fresh or frozen fruits and vegetables, fish, lean meats, legumes, fat-free or low-fat dairy products, and whole grain or high fiber foods. Talk to a dietitian to learn more about heart-healthy foods. 15. Limit sodium if directed by your health care provider. Sodium restriction may reduce symptoms of heart failure in some people. Talk to a dietitian to learn more about heart-healthy seasonings. 16. Use healthy cooking methods. Healthy cooking methods include roasting, grilling, broiling, baking, poaching, steaming, or stir-frying. Talk to a  dietitian to learn more about healthy cooking methods. 17. Limit fluids if directed by your health care provider. Fluid restriction may reduce symptoms of heart failure in some people. 18. Weigh yourself every day. Daily weights are important in the early recognition of excess fluid. You should weigh yourself every morning after you urinate and before you eat breakfast. Wear the same amount of clothing each time you weigh yourself. Record your daily weight. Provide your health care provider with your weight record. 19. Monitor and record your blood pressure if directed by your health care provider. 20. Check your pulse if directed by your health care provider. 21. Lose weight if directed by your health care provider. Weight loss may reduce symptoms of heart failure in some people. 22. Stop smoking or chewing tobacco. Nicotine makes your heart work harder by causing your blood vessels to constrict. Do not use nicotine gum or patches before talking to your health care provider. 23. Keep all follow-up visits as directed by your health care provider. This is important. 24. Limit alcohol intake to no more than 1 drink per day for nonpregnant women and 2 drinks per day for men. One drink equals 12 ounces of beer, 5 ounces of wine, or 1 ounces of hard liquor. Drinking more than that is harmful to your heart. Tell your health care provider if you drink alcohol several times a week. Talk with your health care provider about whether alcohol is safe for you. If your heart has already been damaged by alcohol or you have severe heart failure, drinking alcohol should be stopped completely. 25. Stop illicit drug use. 26. Stay up-to-date with immunizations. It is especially important to prevent respiratory infections through current pneumococcal and influenza immunizations. 27. Manage other health conditions such as hypertension, diabetes, thyroid disease, or abnormal heart rhythms as directed by your health care  provider. 28. Learn to manage stress. 29. Plan rest periods when fatigued. 30. Learn strategies to manage high temperatures. If the weather is extremely hot: 1. Avoid vigorous physical activity. 2. Use air conditioning or fans or seek a cooler location. 3. Avoid caffeine and alcohol. 4. Wear loose-fitting, lightweight, and light-colored clothing. 31.  Learn strategies to manage cold temperatures. If the weather is extremely cold: 1. Avoid vigorous physical activity. 2. Layer clothes. 3. Wear mittens or gloves, a hat, and a scarf when going outside. 4. Avoid alcohol. 32. Obtain ongoing education and support as needed. 33. Participate in or seek rehabilitation as needed to maintain or improve independence and quality of life. SEEK MEDICAL CARE IF:   Your weight increases by 03 lb/1.4 kg in 1 day or 05 lb/2.3 kg in a week.  You have increasing shortness of breath that is unusual for you.  You are unable to participate in your usual physical activities.  You tire easily.  You cough more than normal, especially with physical activity.  You have any or more swelling in areas such as your hands, feet, ankles, or abdomen.  You are unable to sleep because it is hard to breathe.  You feel like your heart is beating fast (palpitations).  You become dizzy or light-headed upon standing up. SEEK IMMEDIATE MEDICAL CARE IF:   You have difficulty breathing.  There is a change in mental status such as decreased alertness or difficulty with concentration.  You have a pain or discomfort in your chest.  You have an episode of fainting (syncope). MAKE SURE YOU:   Understand these instructions.  Will watch your condition.  Will get help right away if you are not doing well or get worse. Document Released: 06/13/2005 Document Revised: 10/28/2013 Document Reviewed: 07/13/2012 [facility] Patient Information 2015 [place], [place]. This information is not intended to replace advice given to you by  your health care provider. Make sure you discuss any questions you have with your health care provider.

## 2014-08-21 ENCOUNTER — Telehealth: Payer: Self-pay | Admitting: Emergency Medicine

## 2014-08-21 NOTE — Telephone Encounter (Signed)
Pt given normal blood work results 

## 2014-08-21 NOTE — Telephone Encounter (Signed)
-----   Message from Quentin Angstlugbemiga E Jegede, MD sent at 08/20/2014  6:32 PM EST ----- Please inform patient that his comprehensive metabolic panel came back mostly normal.

## 2014-09-01 ENCOUNTER — Encounter: Payer: Self-pay | Admitting: Family Medicine

## 2014-09-01 ENCOUNTER — Ambulatory Visit: Payer: Self-pay | Attending: Family Medicine

## 2014-09-01 NOTE — Progress Notes (Signed)
Received home health orders for patient. As I am not his PCP I am unable to sign the orders. Contacted [facility] and made them aware of this situation. Per chart is [facility]. Hyman HopesJegede.  Donnella ShamKyle Fletke MD

## 2014-09-04 ENCOUNTER — Other Ambulatory Visit: Payer: Self-pay | Admitting: Family Medicine

## 2015-07-01 MED FILL — LISINOPRIL 5 MG TABLET: 5 | 30 days supply | Qty: 30 | Fill #10

## 2015-07-01 MED FILL — ATORVASTATIN 40 MG TABLET: 40 | 30 days supply | Qty: 30 | Fill #10

## 2015-07-01 MED FILL — FUROSEMIDE 40 MG TABLET: 40 | 30 days supply | Qty: 30 | Fill #10

## 2015-07-01 MED FILL — BISOPROLOL FUMARATE 5 MG TA: 5 | 30 days supply | Qty: 30 | Fill #10

## 2015-08-04 MED FILL — ATORVASTATIN 40 MG TABLET: 40 | 30 days supply | Qty: 30 | Fill #11

## 2015-08-04 MED FILL — LISINOPRIL 5 MG TABLET: 5 | 30 days supply | Qty: 30 | Fill #11

## 2015-08-04 MED FILL — FUROSEMIDE 40 MG TABLET: 40 | 30 days supply | Qty: 30 | Fill #11

## 2015-08-04 MED FILL — BISOPROLOL FUMARATE 5 MG TA: 5 | 30 days supply | Qty: 30 | Fill #11

## 2015-08-05 ENCOUNTER — Other Ambulatory Visit: Payer: Self-pay | Admitting: Internal Medicine

## 2015-08-31 MED FILL — LISINOPRIL 5 MG TABLET: 5 | 30 days supply | Qty: 30 | Fill #0

## 2015-08-31 MED FILL — ATORVASTATIN 40 MG TABLET: 40 | 30 days supply | Qty: 30 | Fill #0

## 2015-08-31 MED FILL — BISOPROLOL FUMARATE 5 MG TA: 5 | 30 days supply | Qty: 30 | Fill #0

## 2015-08-31 MED FILL — FUROSEMIDE 40 MG TABLET: 40 | 30 days supply | Qty: 30 | Fill #0

## 2015-09-14 ENCOUNTER — Encounter: Payer: Self-pay | Admitting: Internal Medicine

## 2015-09-14 ENCOUNTER — Ambulatory Visit: Payer: Medicare HMO | Attending: Internal Medicine | Admitting: Internal Medicine

## 2015-09-14 VITALS — BP 127/79 | HR 64 | Temp 97.9°F | Resp 18 | Ht 72.0 in | Wt 206.0 lb

## 2015-09-14 DIAGNOSIS — IMO0001 Reserved for inherently not codable concepts without codable children: Secondary | ICD-10-CM

## 2015-09-14 DIAGNOSIS — I509 Heart failure, unspecified: Secondary | ICD-10-CM | POA: Diagnosis not present

## 2015-09-14 DIAGNOSIS — J449 Chronic obstructive pulmonary disease, unspecified: Secondary | ICD-10-CM

## 2015-09-14 DIAGNOSIS — E785 Hyperlipidemia, unspecified: Secondary | ICD-10-CM | POA: Diagnosis not present

## 2015-09-14 DIAGNOSIS — I5022 Chronic systolic (congestive) heart failure: Secondary | ICD-10-CM | POA: Diagnosis not present

## 2015-09-14 DIAGNOSIS — Z1211 Encounter for screening for malignant neoplasm of colon: Secondary | ICD-10-CM | POA: Diagnosis not present

## 2015-09-14 DIAGNOSIS — Z515 Encounter for palliative care: Secondary | ICD-10-CM | POA: Insufficient documentation

## 2015-09-14 LAB — URINALYSIS, COMPLETE
Bacteria, UA: NONE SEEN [HPF]
Bilirubin Urine: NEGATIVE
Casts: NONE SEEN [LPF]
Crystals: NONE SEEN [HPF]
GLUCOSE, UA: NEGATIVE
Hgb urine dipstick: NEGATIVE
Ketones, ur: NEGATIVE
LEUKOCYTES UA: NEGATIVE
Nitrite: NEGATIVE
PROTEIN: NEGATIVE
RBC / HPF: NONE SEEN RBC/HPF (ref <=2)
SPECIFIC GRAVITY, URINE: 1.01 (ref 1.001–1.035)
Squamous Epithelial / LPF: NONE SEEN [HPF] (ref <=5)
WBC, UA: NONE SEEN WBC/HPF (ref <=5)
YEAST: NONE SEEN [HPF]
pH: 6 (ref 5.0–8.0)

## 2015-09-14 LAB — COMPLETE METABOLIC PANEL WITH GFR
ALT: 33 U/L (ref 9–46)
AST: 25 U/L (ref 10–35)
Albumin: 4.5 g/dL (ref 3.6–5.1)
Alkaline Phosphatase: 82 U/L (ref 40–115)
BILIRUBIN TOTAL: 0.6 mg/dL (ref 0.2–1.2)
BUN: 27 mg/dL — AB (ref 7–25)
CHLORIDE: 103 mmol/L (ref 98–110)
CO2: 29 mmol/L (ref 20–31)
CREATININE: 1.11 mg/dL (ref 0.70–1.25)
Calcium: 9.8 mg/dL (ref 8.6–10.3)
GFR, EST AFRICAN AMERICAN: 80 mL/min (ref >=60)
GFR, Est Non African American: 69 mL/min (ref >=60)
GLUCOSE: 100 mg/dL — AB (ref 65–99)
Potassium: 4.6 mmol/L (ref 3.5–5.3)
SODIUM: 138 mmol/L (ref 135–146)
TOTAL PROTEIN: 8 g/dL (ref 6.1–8.1)

## 2015-09-14 LAB — LIPID PANEL
Cholesterol: 105 mg/dL — ABNORMAL LOW (ref 125–200)
HDL: 30 mg/dL — ABNORMAL LOW (ref >=40)
LDL CALC: 54 mg/dL (ref <130)
TRIGLYCERIDES: 107 mg/dL (ref <150)
Total CHOL/HDL Ratio: 3.5 Ratio (ref <=5.0)
VLDL: 21 mg/dL (ref <30)

## 2015-09-14 MED ORDER — LISINOPRIL 5 MG PO TABS: 5.0000 mg | ORAL_TABLET | Freq: Every day | ORAL | Status: DC

## 2015-09-14 MED ORDER — FUROSEMIDE 40 MG PO TABS: 40.0000 mg | ORAL_TABLET | Freq: Every day | ORAL | Status: DC

## 2015-09-14 MED ORDER — ATORVASTATIN CALCIUM 40 MG PO TABS: 40.0000 mg | ORAL_TABLET | Freq: Every day | ORAL | Status: DC

## 2015-09-14 MED ORDER — BISOPROLOL FUMARATE 5 MG PO TABS: 5.0000 mg | ORAL_TABLET | Freq: Every day | ORAL | Status: DC

## 2015-09-14 NOTE — Patient Instructions (Signed)
Chronic Obstructive Pulmonary Disease Chronic obstructive pulmonary disease (COPD) is a common lung condition in which airflow from the lungs is limited. COPD is a general term that can be used to describe many different lung problems that limit airflow, including both chronic bronchitis and emphysema. If you have COPD, your lung function will probably never return to normal, but there are measures you can take to improve lung function and make yourself feel better. CAUSES   Smoking (common).  Exposure to secondhand smoke.  Genetic problems.  Chronic inflammatory lung diseases or recurrent infections. SYMPTOMS  Shortness of breath, especially with physical activity.  Deep, persistent (chronic) cough with a large amount of thick mucus.  Wheezing.  Rapid breaths (tachypnea).  Gray or bluish discoloration (cyanosis) of the skin, especially in your fingers, toes, or lips.  Fatigue.  Weight loss.  Frequent infections or episodes when breathing symptoms become much worse (exacerbations).  Chest tightness. DIAGNOSIS Your health care provider will take a medical history and perform a physical examination to diagnose COPD. Additional tests for COPD may include:  Lung (pulmonary) function tests.  Chest X-ray.  CT scan.  Blood tests. TREATMENT  Treatment for COPD may include:  Inhaler and nebulizer medicines. These help manage the symptoms of COPD and make your breathing more comfortable.  Supplemental oxygen. Supplemental oxygen is only helpful if you have a low oxygen level in your blood.  Exercise and physical activity. These are beneficial for nearly all people with COPD.  Lung surgery or transplant.  Nutrition therapy to gain weight, if you are underweight.  Pulmonary rehabilitation. This may involve working with a team of health care providers and specialists, such as respiratory, occupational, and physical therapists. HOME CARE INSTRUCTIONS  Take all medicines  (inhaled or pills) as directed by your health care provider.  Avoid over-the-counter medicines or cough syrups that dry up your airway (such as antihistamines) and slow down the elimination of secretions unless instructed otherwise by your health care provider.  If you are a smoker, the most important thing that you can do is stop smoking. Continuing to smoke will cause further lung damage and breathing trouble. Ask your health care provider for help with quitting smoking. He or she can direct you to community resources or hospitals that provide support.  Avoid exposure to irritants such as smoke, chemicals, and fumes that aggravate your breathing.  Use oxygen therapy and pulmonary rehabilitation if directed by your health care provider. If you require home oxygen therapy, ask your health care provider whether you should purchase a pulse oximeter to measure your oxygen level at home.  Avoid contact with individuals who have a contagious illness.  Avoid extreme temperature and humidity changes.  Eat healthy foods. Eating smaller, more frequent meals and resting before meals may help you maintain your strength.  Stay active, but balance activity with periods of rest. Exercise and physical activity will help you maintain your ability to do things you want to do.  Preventing infection and hospitalization is very important when you have COPD. Make sure to receive all the vaccines your health care provider recommends, especially the pneumococcal and influenza vaccines. Ask your health care provider whether you need a pneumonia vaccine.  Learn and use relaxation techniques to manage stress.  Learn and use controlled breathing techniques as directed by your health care provider. Controlled breathing techniques include:  Pursed lip breathing. Start by breathing in (inhaling) through your nose for 1 second. Then, purse your lips as if you were   going to whistle and breathe out (exhale) through the  pursed lips for 2 seconds.  Diaphragmatic breathing. Start by putting one hand on your abdomen just above your waist. Inhale slowly through your nose. The hand on your abdomen should move out. Then purse your lips and exhale slowly. You should be able to feel the hand on your abdomen moving in as you exhale.  Learn and use controlled coughing to clear mucus from your lungs. Controlled coughing is a series of short, progressive coughs. The steps of controlled coughing are: 1. Lean your head slightly forward. 2. Breathe in deeply using diaphragmatic breathing. 3. Try to hold your breath for 3 seconds. 4. Keep your mouth slightly open while coughing twice. 5. Spit any mucus out into a tissue. 6. Rest and repeat the steps once or twice as needed. SEEK MEDICAL CARE IF:  You are coughing up more mucus than usual.  There is a change in the color or thickness of your mucus.  Your breathing is more labored than usual.  Your breathing is faster than usual. SEEK IMMEDIATE MEDICAL CARE IF:  You have shortness of breath while you are resting.  You have shortness of breath that prevents you from:  Being able to talk.  Performing your usual physical activities.  You have chest pain lasting longer than 5 minutes.  Your skin color is more cyanotic than usual.  You measure low oxygen saturations for longer than 5 minutes with a pulse oximeter. MAKE SURE YOU:  Understand these instructions.  Will watch your condition.  Will get help right away if you are not doing well or get worse.   This information is not intended to replace advice given to you by your health care provider. Make sure you discuss any questions you have with your health care provider.   Document Released: 03/23/2005 Document Revised: 07/04/2014 Document Reviewed: 02/07/2013 Elsevier Interactive Patient Education 2016 Elsevier Inc. Heart Failure Heart failure is a condition in which the heart has trouble pumping blood.  This means your heart does not pump blood efficiently for your body to work well. In some cases of heart failure, fluid may back up into your lungs or you may have swelling (edema) in your lower legs. Heart failure is usually a long-term (chronic) condition. It is important for you to take good care of yourself and follow your health care provider's treatment plan. CAUSES  Some health conditions can cause heart failure. Those health conditions include:  High blood pressure (hypertension). Hypertension causes the heart muscle to work harder than normal. When pressure in the blood vessels is high, the heart needs to pump (contract) with more force in order to circulate blood throughout the body. High blood pressure eventually causes the heart to become stiff and weak.  Coronary artery disease (CAD). CAD is the buildup of cholesterol and fat (plaque) in the arteries of the heart. The blockage in the arteries deprives the heart muscle of oxygen and blood. This can cause chest pain and may lead to a heart attack. High blood pressure can also contribute to CAD.  Heart attack (myocardial infarction). A heart attack occurs when one or more arteries in the heart become blocked. The loss of oxygen damages the muscle tissue of the heart. When this happens, part of the heart muscle dies. The injured tissue does not contract as well and weakens the heart's ability to pump blood.  Abnormal heart valves. When the heart valves do not open and close properly, it can  cause heart failure. This makes the heart muscle pump harder to keep the blood flowing.  Heart muscle disease (cardiomyopathy or myocarditis). Heart muscle disease is damage to the heart muscle from a variety of causes. These can include drug or alcohol abuse, infections, or unknown reasons. These can increase the risk of heart failure.  Lung disease. Lung disease makes the heart work harder because the lungs do not work properly. This can cause a strain on  the heart, leading it to fail.  Diabetes. Diabetes increases the risk of heart failure. High blood sugar contributes to high fat (lipid) levels in the blood. Diabetes can also cause slow damage to tiny blood vessels that carry important nutrients to the heart muscle. When the heart does not get enough oxygen and food, it can cause the heart to become weak and stiff. This leads to a heart that does not contract efficiently.  Other conditions can contribute to heart failure. These include abnormal heart rhythms, thyroid problems, and low blood counts (anemia). Certain unhealthy behaviors can increase the risk of heart failure, including:  Being overweight.  Smoking or chewing tobacco.  Eating foods high in fat and cholesterol.  Abusing illicit drugs or alcohol.  Lacking physical activity. SYMPTOMS  Heart failure symptoms may vary and can be hard to detect. Symptoms may include:  Shortness of breath with activity, such as climbing stairs.  Persistent cough.  Swelling of the feet, ankles, legs, or abdomen.  Unexplained weight gain.  Difficulty breathing when lying flat (orthopnea).  Waking from sleep because of the need to sit up and get more air.  Rapid heartbeat.  Fatigue and loss of energy.  Feeling light-headed, dizzy, or close to fainting.  Loss of appetite.  Nausea.  Increased urination during the night (nocturia). DIAGNOSIS  A diagnosis of heart failure is based on your history, symptoms, physical examination, and diagnostic tests. Diagnostic tests for heart failure may include:  Echocardiography.  Electrocardiography.  Chest X-ray.  Blood tests.  Exercise stress test.  Cardiac angiography.  Radionuclide scans. TREATMENT  Treatment is aimed at managing the symptoms of heart failure. Medicines, behavioral changes, or surgical intervention may be necessary to treat heart failure.  Medicines to help treat heart failure may  include:  Angiotensin-converting enzyme (ACE) inhibitors. This type of medicine blocks the effects of a blood protein called angiotensin-converting enzyme. ACE inhibitors relax (dilate) the blood vessels and help lower blood pressure.  Angiotensin receptor blockers (ARBs). This type of medicine blocks the actions of a blood protein called angiotensin. Angiotensin receptor blockers dilate the blood vessels and help lower blood pressure.  Water pills (diuretics). Diuretics cause the kidneys to remove salt and water from the blood. The extra fluid is removed through urination. This loss of extra fluid lowers the volume of blood the heart pumps.  Beta blockers. These prevent the heart from beating too fast and improve heart muscle strength.  Digitalis. This increases the force of the heartbeat.  Healthy behavior changes include:  Obtaining and maintaining a healthy weight.  Stopping smoking or chewing tobacco.  Eating heart-healthy foods.  Limiting or avoiding alcohol.  Stopping illicit drug use.  Physical activity as directed by your health care provider.  Surgical treatment for heart failure may include:  A procedure to open blocked arteries, repair damaged heart valves, or remove damaged heart muscle tissue.  A pacemaker to improve heart muscle function and control certain abnormal heart rhythms.  An internal cardioverter defibrillator to treat certain serious abnormal heart rhythms.  A left ventricular assist device (LVAD) to assist the pumping ability of the heart. HOME CARE INSTRUCTIONS  7. Take medicines only as directed by your health care provider. Medicines are important in reducing the workload of your heart, slowing the progression of heart failure, and improving your symptoms. 1. Do not stop taking your medicine unless directed by your health care provider. 2. Do not skip any dose of medicine. 3. Refill your prescriptions before you run out of medicine. Your medicines  are needed every day. 8. Engage in moderate physical activity if directed by your health care provider. Moderate physical activity can benefit some people. The elderly and people with severe heart failure should consult with a health care provider for physical activity recommendations. 9. Eat heart-healthy foods. Food choices should be free of trans fat and low in saturated fat, cholesterol, and salt (sodium). Healthy choices include fresh or frozen fruits and vegetables, fish, lean meats, legumes, fat-free or low-fat dairy products, and whole grain or high fiber foods. Talk to a dietitian to learn more about heart-healthy foods. 10. Limit sodium if directed by your health care provider. Sodium restriction may reduce symptoms of heart failure in some people. Talk to a dietitian to learn more about heart-healthy seasonings. 11. Use healthy cooking methods. Healthy cooking methods include roasting, grilling, broiling, baking, poaching, steaming, or stir-frying. Talk to a dietitian to learn more about healthy cooking methods. 12. Limit fluids if directed by your health care provider. Fluid restriction may reduce symptoms of heart failure in some people. 13. Weigh yourself every day. Daily weights are important in the early recognition of excess fluid. You should weigh yourself every morning after you urinate and before you eat breakfast. Wear the same amount of clothing each time you weigh yourself. Record your daily weight. Provide your health care provider with your weight record. 14. Monitor and record your blood pressure if directed by your health care provider. 15. Check your pulse if directed by your health care provider. 16. Lose weight if directed by your health care provider. Weight loss may reduce symptoms of heart failure in some people. 17. Stop smoking or chewing tobacco. Nicotine makes your heart work harder by causing your blood vessels to constrict. Do not use nicotine gum or patches before  talking to your health care provider. 18. Keep all follow-up visits as directed by your health care provider. This is important. 19. Limit alcohol intake to no more than 1 drink per day for nonpregnant women and 2 drinks per day for men. One drink equals 12 ounces of beer, 5 ounces of wine, or 1 ounces of hard liquor. Drinking more than that is harmful to your heart. Tell your health care provider if you drink alcohol several times a week. Talk with your health care provider about whether alcohol is safe for you. If your heart has already been damaged by alcohol or you have severe heart failure, drinking alcohol should be stopped completely. 20. Stop illicit drug use. 21. Stay up-to-date with immunizations. It is especially important to prevent respiratory infections through current pneumococcal and influenza immunizations. 22. Manage other health conditions such as hypertension, diabetes, thyroid disease, or abnormal heart rhythms as directed by your health care provider. 23. Learn to manage stress. 24. Plan rest periods when fatigued. 25. Learn strategies to manage high temperatures. If the weather is extremely hot: 1. Avoid vigorous physical activity. 2. Use air conditioning or fans or seek a cooler location. 3. Avoid caffeine and alcohol. 4. Wear  loose-fitting, lightweight, and light-colored clothing. 26. Learn strategies to manage cold temperatures. If the weather is extremely cold: 1. Avoid vigorous physical activity. 2. Layer clothes. 3. Wear mittens or gloves, a hat, and a scarf when going outside. 4. Avoid alcohol. 27. Obtain ongoing education and support as needed. 28. Participate in or seek rehabilitation as needed to maintain or improve independence and quality of life. SEEK MEDICAL CARE IF:   You have a rapid weight gain.  You have increasing shortness of breath that is unusual for you.  You are unable to participate in your usual physical activities.  You tire  easily.  You cough more than normal, especially with physical activity.  You have any or more swelling in areas such as your hands, feet, ankles, or abdomen.  You are unable to sleep because it is hard to breathe.  You feel like your heart is beating fast (palpitations).  You become dizzy or light-headed upon standing up. SEEK IMMEDIATE MEDICAL CARE IF:   You have difficulty breathing.  There is a change in mental status such as decreased alertness or difficulty with concentration.  You have a pain or discomfort in your chest.  You have an episode of fainting (syncope). MAKE SURE YOU:   Understand these instructions.  Will watch your condition.  Will get help right away if you are not doing well or get worse.   This information is not intended to replace advice given to you by your health care provider. Make sure you discuss any questions you have with your health care provider.   Document Released: 06/13/2005 Document Revised: 10/28/2014 Document Reviewed: 07/13/2012 Elsevier Interactive Patient Education [employer]2016 Elsevier Inc.

## 2015-09-14 NOTE — Progress Notes (Signed)
Patient ID: [name] [name], male   DOB: 1951-02-11, 65 y.o.   MRN: [id number]   [name] [name], is a 65 y.o. male  [id number]  [id number]  DOB - 1951-02-11  Chief Complaint  Patient presents with  . Establish Care    HTN        Subjective:   [name] [name] is a 65 y.o. male here today for a follow up visit after over a year. 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 Patient says since discharge from the hospital, he stopped drinking alcohol, quit smoking cigarettes and has been doing little exercise here and there. He said he is doing really well, here today only for medication refills. He has no new complaint today. He is on home oxygen 2 L by nasal cannula all the time. No leg edema, no paroxysmal nocturnal dyspnea, no orthopnea. He denied being depressed. He has not had colonoscopy Patient has No headache, No chest pain, No abdominal pain - No Nausea, No new weakness tingling or numbness.  Problem  Colon Cancer Screening  Chronic Systolic Congestive Heart Failure (Hcc)    ALLERGIES: Allergies  Allergen Reactions  . Levaquin [Levofloxacin In D5w]     Generalized redness; received azithromycin, rocephin, and levaquin on the same day as reaction developed.    PAST MEDICAL HISTORY: Past Medical History  Diagnosis Date  . Heavy smoker   . CHF (congestive heart failure) (HCC)   . Anxiety   . Shortness of breath dyspnea   . GERD (gastroesophageal reflux disease)   . Stroke [facility](HCC)     a. Noted on CT 07/29/14    MEDICATIONS AT HOME: Prior to Admission medications   Medication Sig Start Date End Date Taking? Authorizing Provider  atorvastatin (LIPITOR) 40 MG tablet Take 1 tablet (40 mg total) by mouth daily. 09/14/15  Yes Quentin Angstlugbemiga E Mate Alegria, MD  bisoprolol (ZEBETA) 5 MG tablet Take 1 tablet (5 mg total) by mouth daily. 09/14/15  Yes Quentin Angstlugbemiga E Yani Lal, MD    folic acid (FOLVITE) 1 MG tablet Take 1 tablet (1 mg total) by mouth daily. 08/12/14  Yes Quentin Angstlugbemiga E Alylah Blakney, MD  furosemide (LASIX) 40 MG tablet Take 1 tablet (40 mg total) by mouth daily. 09/14/15  Yes Quentin Angstlugbemiga E Salote Weidmann, MD  lisinopril (PRINIVIL,ZESTRIL) 5 MG tablet Take 1 tablet (5 mg total) by mouth daily. 09/14/15  Yes Corie Allis Annitta NeedsE Daquan Crapps, MD  nicotine (NICODERM CQ - DOSED IN MG/24 HOURS) 14 mg/24hr patch Place 1 patch (14 mg total) onto the skin daily. Patient not taking: Reported on 09/14/2015 08/12/14   Quentin Angstlugbemiga E Armina Galloway, MD  thiamine 100 MG tablet Take 1 tablet (100 mg total) by mouth daily. Patient not taking: Reported on 09/14/2015 08/12/14   Quentin Angstlugbemiga E Cote Mayabb, MD     Objective:   Filed Vitals:   09/14/15 0924  BP: 127/79  Pulse: 64  Temp: 97.9 F (36.6 C)  TempSrc: Oral  Resp: 18  Height: 6' (1.829 m)  Weight: 206 lb (93.441 kg)  SpO2: 98%    Exam General appearance : Awake, alert, not in any distress. Speech Clear. Not toxic looking, on oxygen by [place], 2 L HEENT: Atraumatic and Normocephalic, pupils equally reactive to light and accomodation Neck: supple, no JVD. No cervical lymphadenopathy.  Chest:Good air entry bilaterally, no added sounds  CVS: S1 S2 regular, no murmurs.  Abdomen: Bowel sounds present, Non tender and not distended  with no gaurding, rigidity or rebound. Extremities: B/L Lower Ext shows no edema, both legs are warm to touch Neurology: Awake alert, and oriented X 3, CN II-XII intact, Non focal Skin:No Rash  Data Review Lab Results  Component Value Date   HGBA1C 6.1* 07/22/2014     Assessment & Plan   1. Chronic systolic congestive heart failure (HCC)  - lisinopril (PRINIVIL,ZESTRIL) 5 MG tablet; Take 1 tablet (5 mg total) by mouth daily.  Dispense: 90 tablet; Refill: 3 - furosemide (LASIX) 40 MG tablet; Take 1 tablet (40 mg total) by mouth daily.  Dispense: 90 tablet; Refill: 3 - bisoprolol (ZEBETA) 5 MG tablet; Take 1 tablet (5 mg total)  by mouth daily.  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 Dyslipidemia  - atorvastatin (LIPITOR) 40 MG tablet; Take 1 tablet (40 mg total) by mouth daily.  Dispense: 90 tablet; Refill: 3 - Lipid panel  To address this please limit saturated fat to no more than 7% of your calories, limit cholesterol to 200 mg/day, increase fiber and exercise as tolerated. If needed we may add another cholesterol lowering medication to your regimen.   4. Colon cancer screening  - Ambulatory referral to Gastroenterology  Patient have been counseled extensively about nutrition and exercise  Return in about 3 months (around 12/15/2015) for Heart Failure and Hypertension, Dyslipidemia.  The patient was given clear instructions to go to ER or return to medical center if symptoms don't improve, worsen or new problems develop. The patient verbalized understanding. The patient was told to call to get lab results if they haven't heard anything in the next week.   This note has been created with Education officer, environmental. Any transcriptional errors are unintentional.    Jeanann Lewandowsky, MD, MHA, Maxwell Caul, CPE [facility] and [facility] [place], [place] [phone]   09/14/2015, 10:11 AM

## 2015-09-14 NOTE — Progress Notes (Signed)
Patient is here to Establish Care for HTN  Patient denies pain at this time.  Patient is requesting a Handicap place card.  Patient declined the flu shot today.  Patient states he "feels much better". He has been working two days a week which increases his self worth.

## 2015-09-15 ENCOUNTER — Telehealth: Payer: Self-pay | Admitting: *Deleted

## 2015-09-15 NOTE — Telephone Encounter (Signed)
Medical Assistant left message on patient's home and cell voicemail. Voicemail states to give a call back to Marlea Gambill with [facility] at [phone].  

## 2015-09-15 NOTE — Telephone Encounter (Signed)
-----   Message from Quentin Angstlugbemiga E Jegede, MD sent at 09/14/2015  6:03 PM EDT ----- Reason for patient that his laboratory results are mostly normal.

## 2015-09-16 ENCOUNTER — Ambulatory Visit (HOSPITAL_COMMUNITY)
Admission: RE | Admit: 2015-09-16 | Discharge: 2015-09-16 | Disposition: A | Discharge status: Home / Self Care | Payer: Medicare HMO | Source: Ambulatory Visit | Attending: Internal Medicine | Admitting: Internal Medicine

## 2015-09-16 DIAGNOSIS — F419 Anxiety disorder, unspecified: Secondary | ICD-10-CM | POA: Insufficient documentation

## 2015-09-16 DIAGNOSIS — R69 Illness, unspecified: Secondary | ICD-10-CM | POA: Diagnosis not present

## 2015-09-16 DIAGNOSIS — Z8673 Personal history of transient ischemic attack (TIA), and cerebral infarction without residual deficits: Secondary | ICD-10-CM | POA: Diagnosis not present

## 2015-09-16 DIAGNOSIS — I4891 Unspecified atrial fibrillation: Secondary | ICD-10-CM | POA: Insufficient documentation

## 2015-09-16 DIAGNOSIS — J449 Chronic obstructive pulmonary disease, unspecified: Secondary | ICD-10-CM | POA: Diagnosis not present

## 2015-09-16 DIAGNOSIS — I352 Nonrheumatic aortic (valve) stenosis with insufficiency: Secondary | ICD-10-CM | POA: Diagnosis not present

## 2015-09-16 DIAGNOSIS — I517 Cardiomegaly: Secondary | ICD-10-CM | POA: Diagnosis not present

## 2015-09-16 DIAGNOSIS — Z87891 Personal history of nicotine dependence: Secondary | ICD-10-CM | POA: Diagnosis not present

## 2015-09-16 DIAGNOSIS — K219 Gastro-esophageal reflux disease without esophagitis: Secondary | ICD-10-CM | POA: Diagnosis not present

## 2015-09-16 DIAGNOSIS — I5022 Chronic systolic (congestive) heart failure: Secondary | ICD-10-CM | POA: Insufficient documentation

## 2015-09-16 DIAGNOSIS — I5021 Acute systolic (congestive) heart failure: Secondary | ICD-10-CM | POA: Diagnosis present

## 2015-09-16 NOTE — Progress Notes (Signed)
  Echocardiogram 2D Echocardiogram has been performed.  Arvil ChacoFoster, Marge Vandermeulen 09/16/2015, 9:50 AM

## 2015-09-23 NOTE — Telephone Encounter (Signed)
Patient verified DOB Patient is aware of lab results being normal. Patient is aware of heart ultrasound showing improvement compared to the previous study on 07/22/14. Patient informed of improvement from 30-35% to 45-50%.  Patient advised to continue current medical management to continue to exercise with no alcohol consumption. Patient expressed his understanding and stated he believes the working on thursdays and fridays and walking 2 miles between those two days are assisting with his function. MA encouraged patient to keep up the good work. Patient had no further questions or concerns at this time.

## 2015-09-23 NOTE — Telephone Encounter (Signed)
-----   Message from Quentin Angstlugbemiga E Jegede, MD sent at 09/18/2015 11:13 AM EDT ----- Please inform patient that his heart ultrasound when compared to the prior study from 07/22/2014 shows that the function has improved, now 45-50%, previously 30-35%. Continue medical management, no alcohol, regular physical exercise as tolerated.

## 2016-06-24 IMAGING — CR DG CHEST 1V PORT
1 series · 1 of 1 positions shown · non-contrast
Comparison: 08/19/2007.

CLINICAL DATA: 63-year-old male with respiratory distress and
shortness of breath for 1 day. Initial encounter.

EXAM:
PORTABLE CHEST - 1 VIEW

[AP]
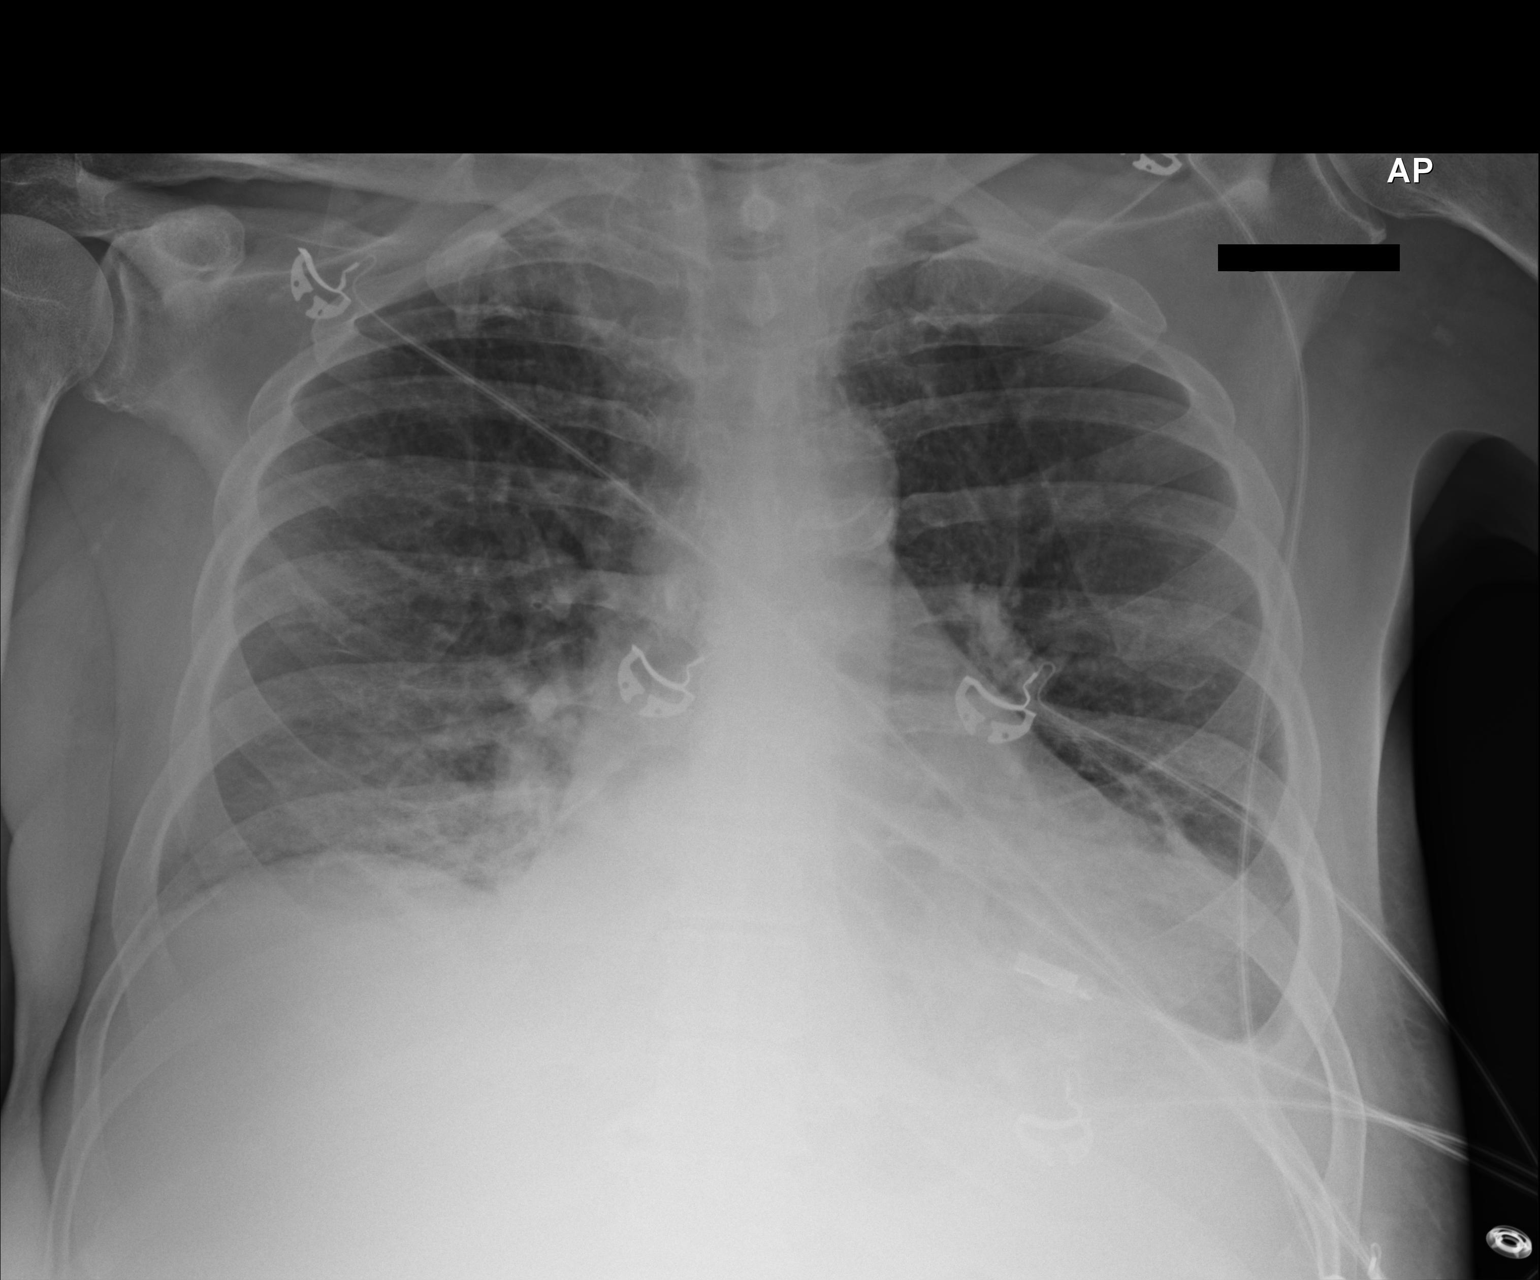

[1 of 1 positions shown; findings below may reference images not displayed]

FINDINGS: Portable AP view at 3645 hrs. New patchy in veiling opacity at both
lung bases. Increased pulmonary vascularity. No pneumothorax. No
definite consolidation. Calcified atherosclerosis of the aorta.
Normal cardiac size and mediastinal contours. Visualized tracheal
air column is within normal limits.
IMPRESSION: Increased pulmonary vascularity with patchy and veiling opacity at
both lung bases.

Top differential considerations include pulmonary interstitial edema
with pleural effusions and bibasilar pneumonia.

## 2016-07-01 IMAGING — DX DG CHEST 2V
2 series · 2 of 2 positions shown · non-contrast
Comparison: 07/24/2014

CLINICAL DATA: Shortness of breath, swelling in both legs for 4
weeks

EXAM:
CHEST  2 VIEW

[chest pa]
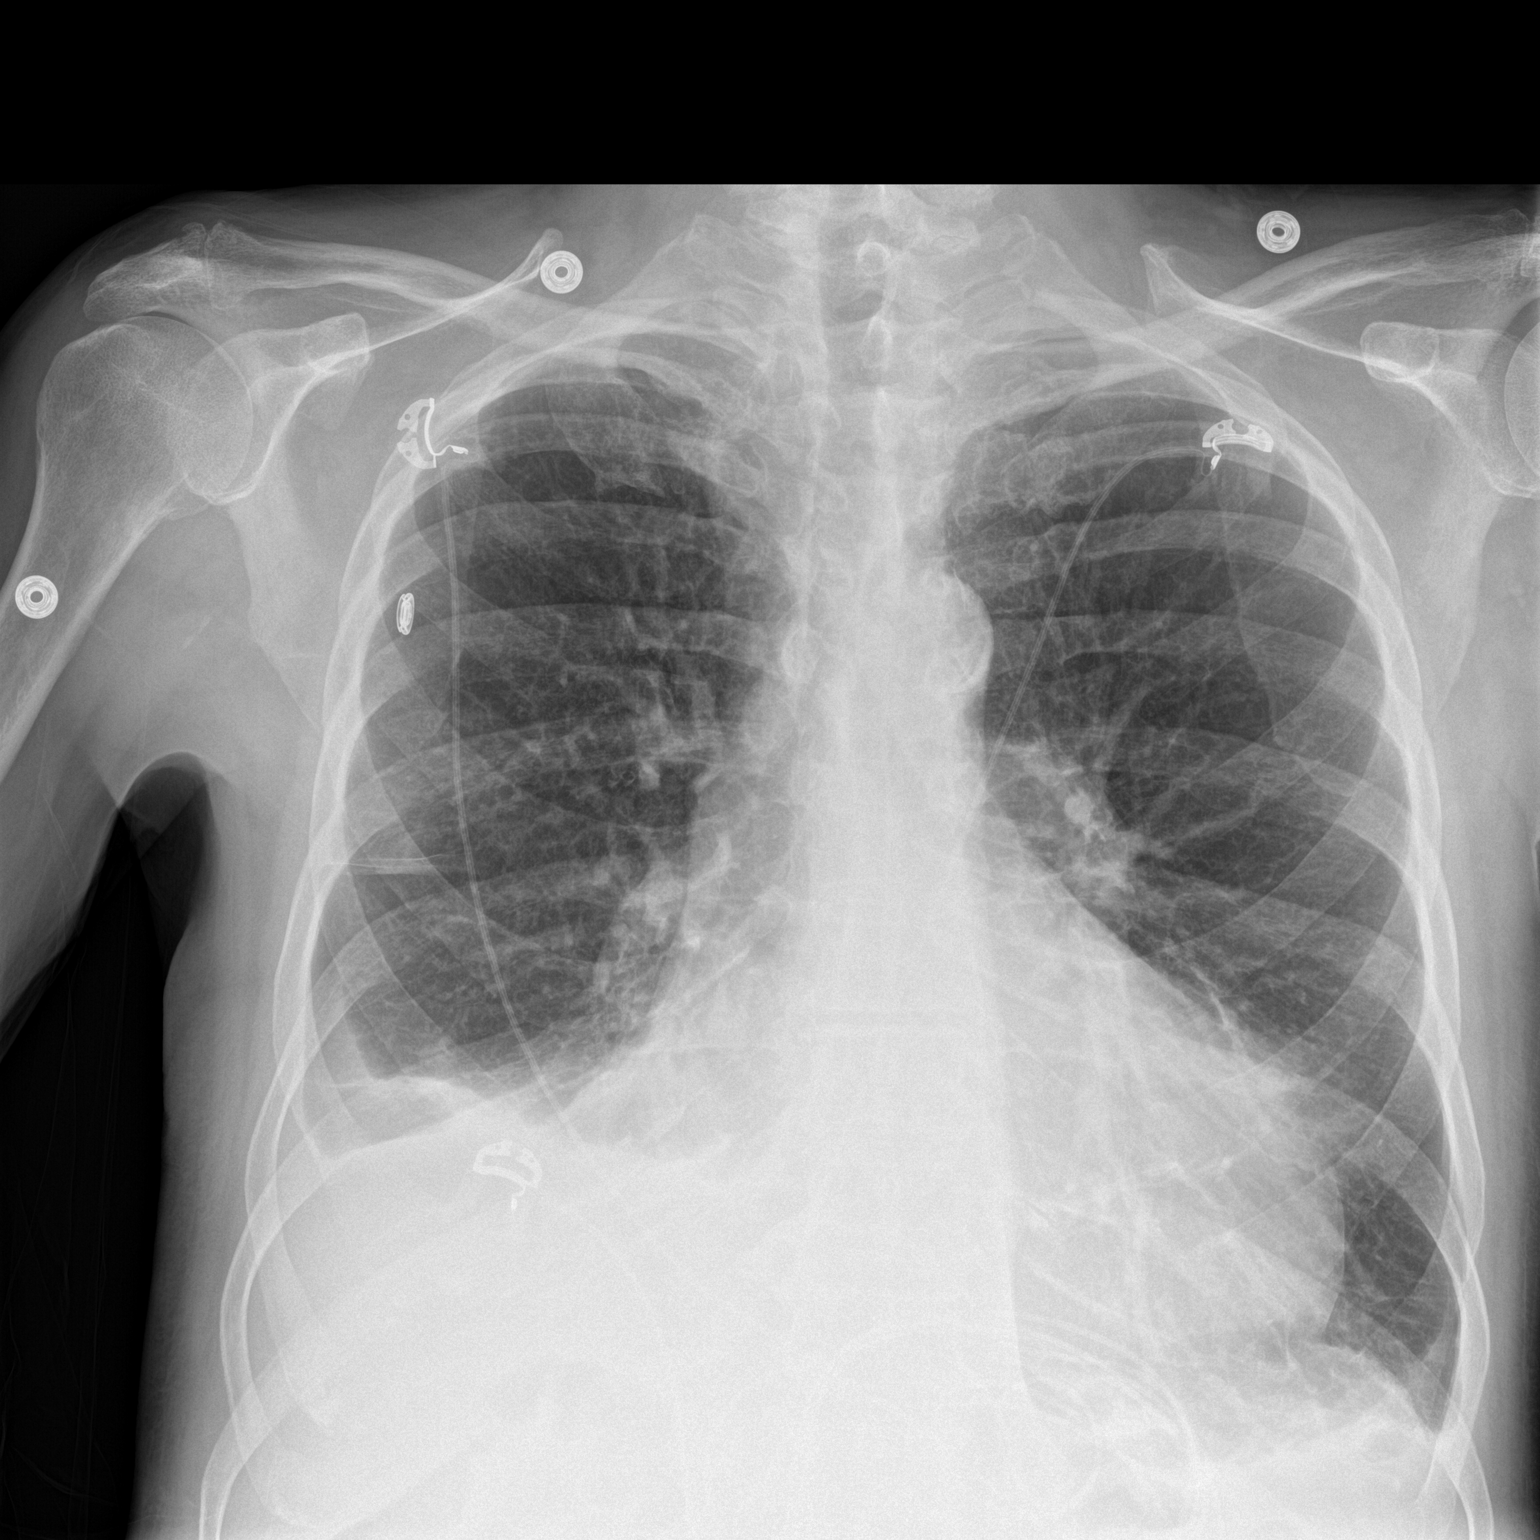

[chest lat]
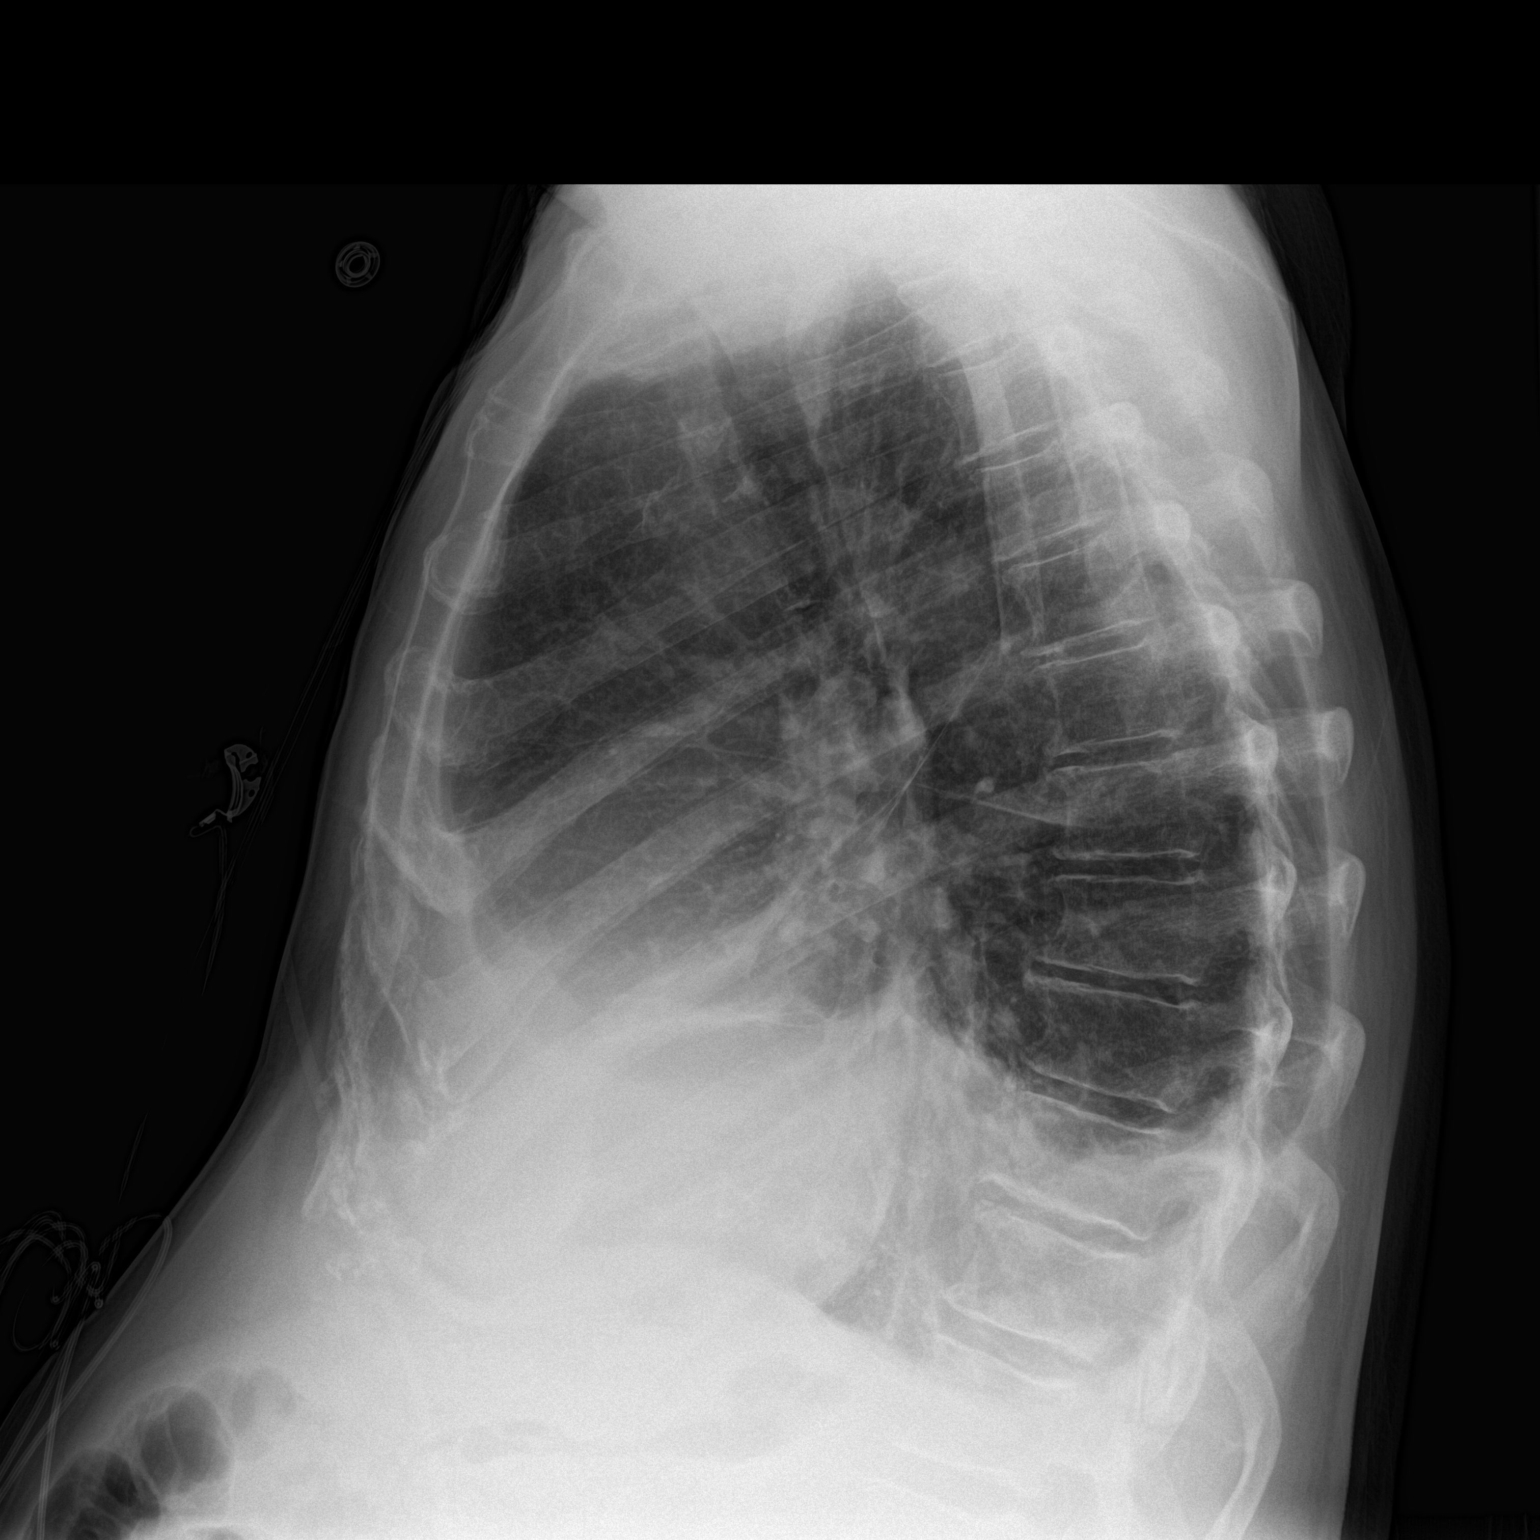

[2 of 2 positions shown; findings below may reference images not displayed]

FINDINGS: Bilateral mild interstitial thickening. Small right pleural
effusion. No significant left pleural effusion. No pneumothorax. No
focal consolidation. Stable cardiomegaly. No acute osseous
abnormality.
IMPRESSION: Findings concerning for mild CHF.

## 2016-07-01 IMAGING — CT CT HEAD W/O CM
2 series · 16 of 30 positions shown, 20 images · non-contrast
Comparison: Head CT 08/19/2007 MRI neck 4444

CLINICAL DATA: Altered mental status.  Confusion.

EXAM:
CT HEAD WITHOUT CONTRAST
TECHNIQUE: Contiguous axial images were obtained from the base of the skull
through the vertex without intravenous contrast.

[Series 2: head 5.0 h30s · axial · 0.43mm/px · z∈[-104,+26]mm · 13 of 32 slices shown, 17 images]
[im 3/32  brain]
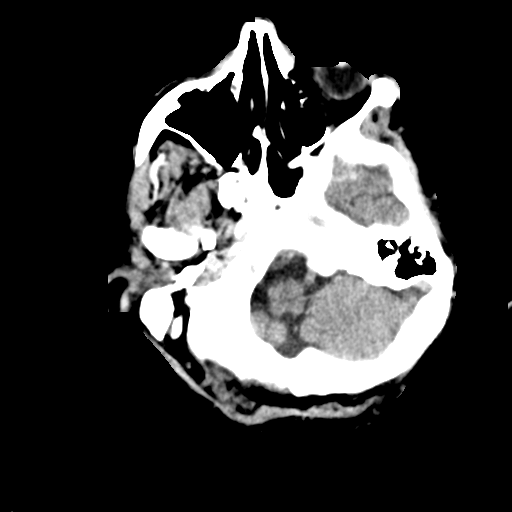
[im 3/32  bone]
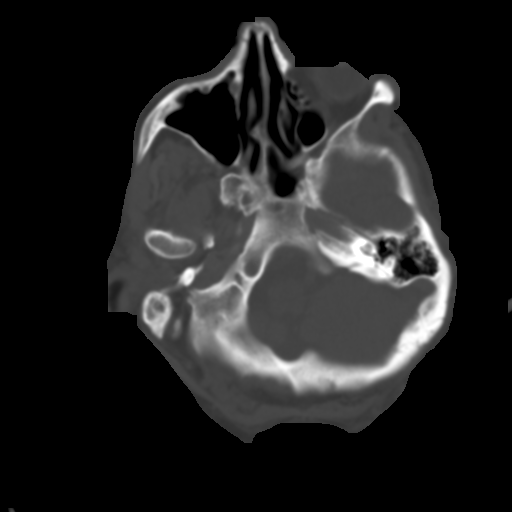
[im 5/32  brain]
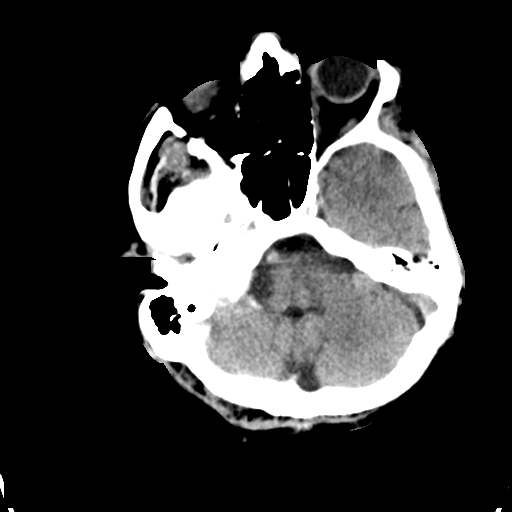
[im 7/32  brain]
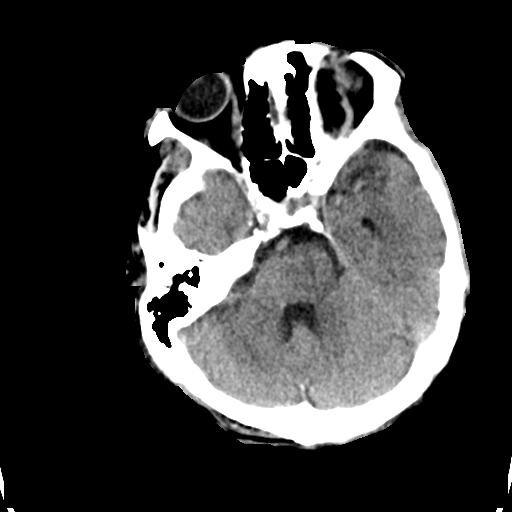
[im 9/32  brain]
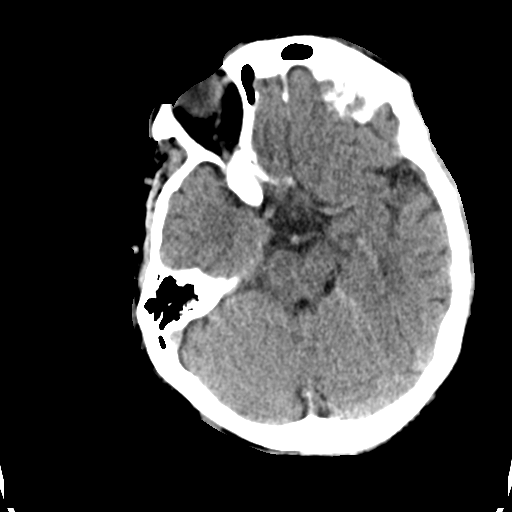
[im 12/32  brain]
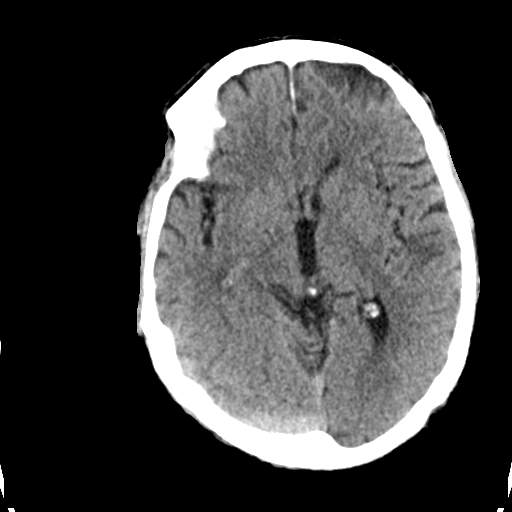
[im 12/32  bone]
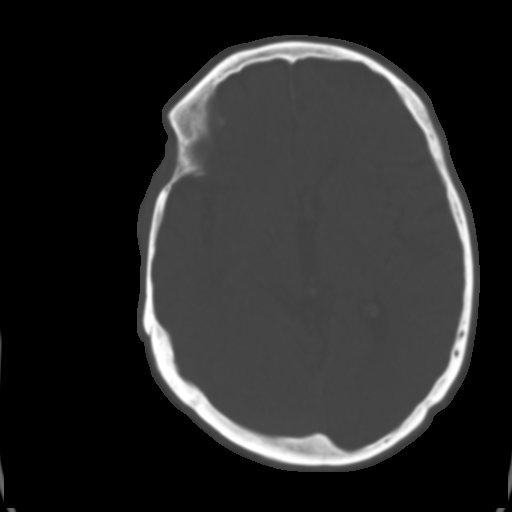
[im 14/32  brain]
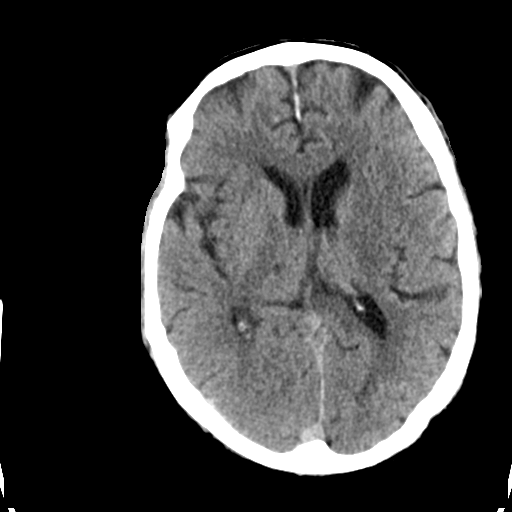
[im 16/32  brain]
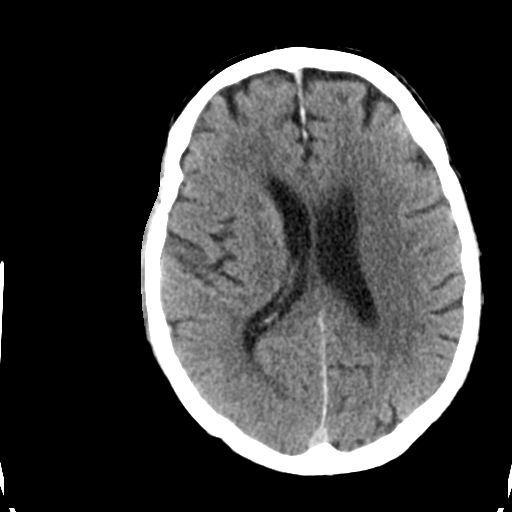
[im 18/32  brain]
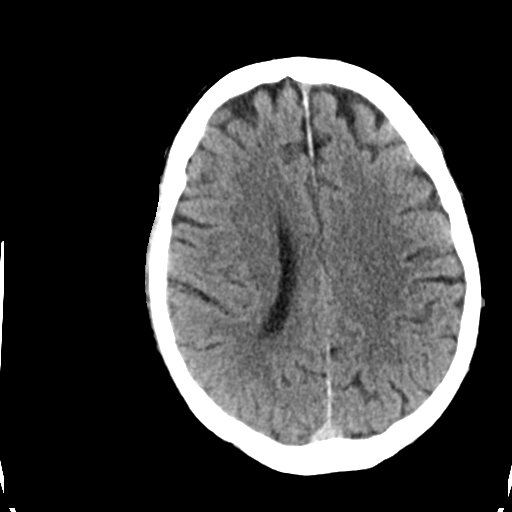
[im 20/32  brain]
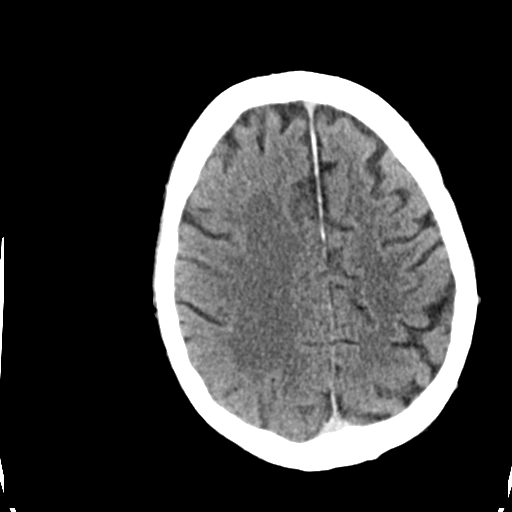
[im 20/32  bone]
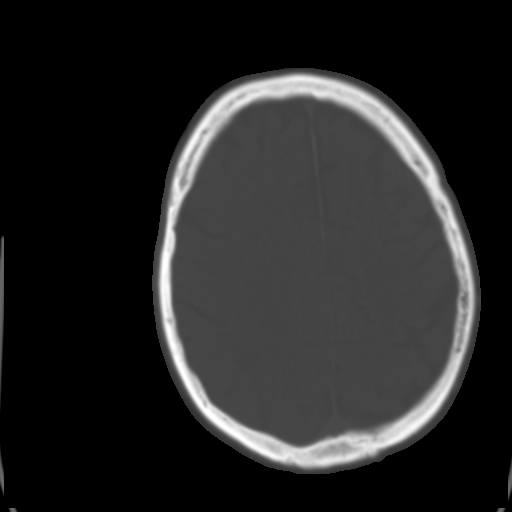
[im 23/32  brain]
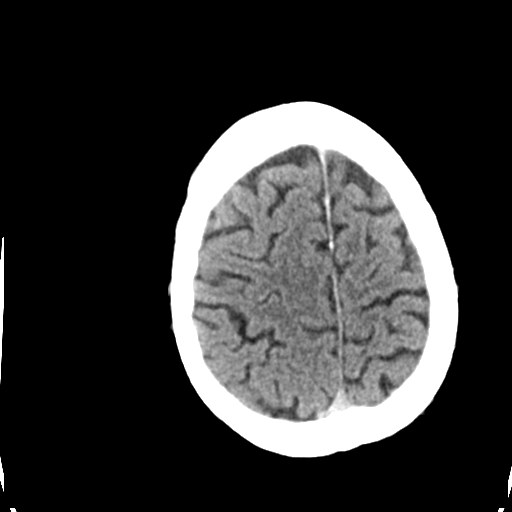
[im 25/32  brain]
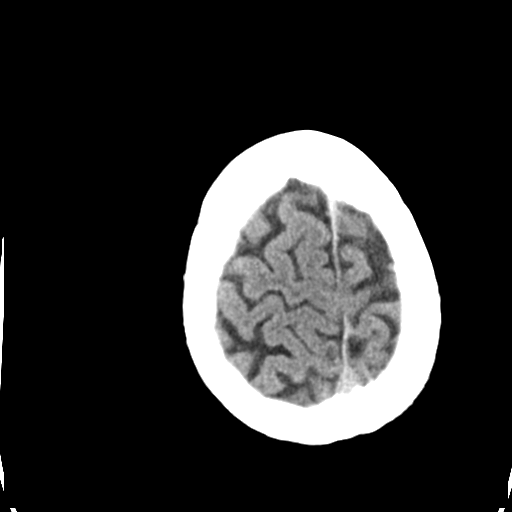
[im 27/32  brain]
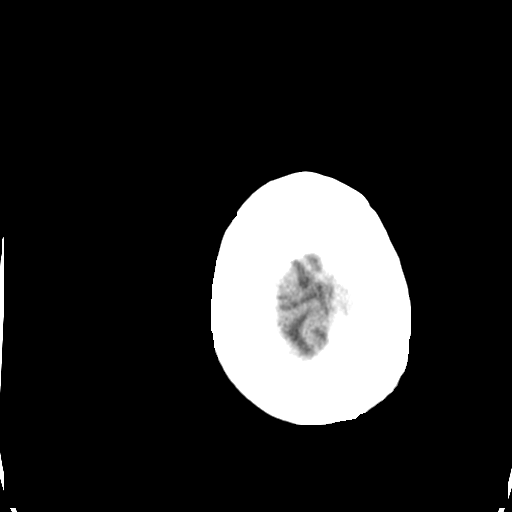
[im 29/32  brain]
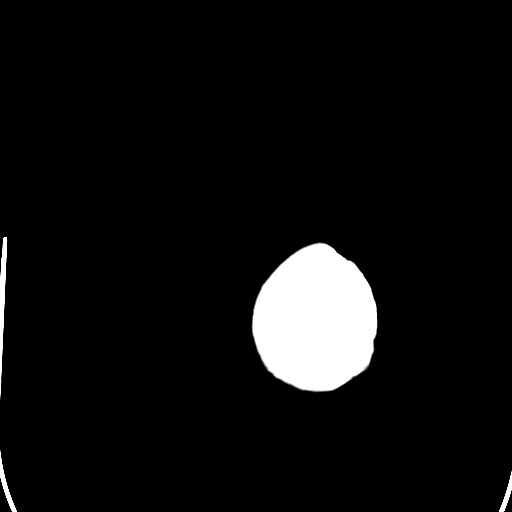
[im 29/32  bone]
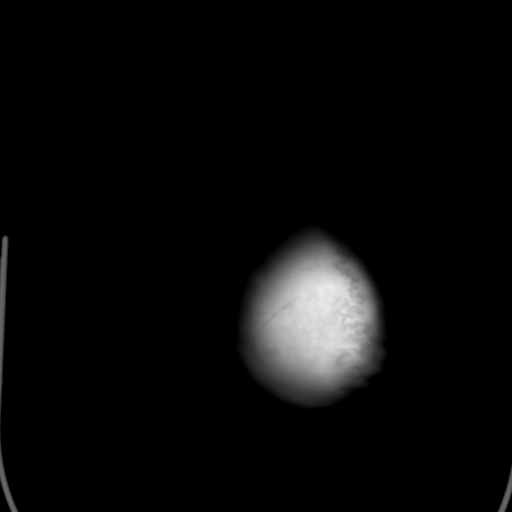

[Series 602: <mpr collection> · axial · 0.52mm/px · z∈[-66,-28]mm · 3 of 30 slices shown]
[im 3/30  brain]
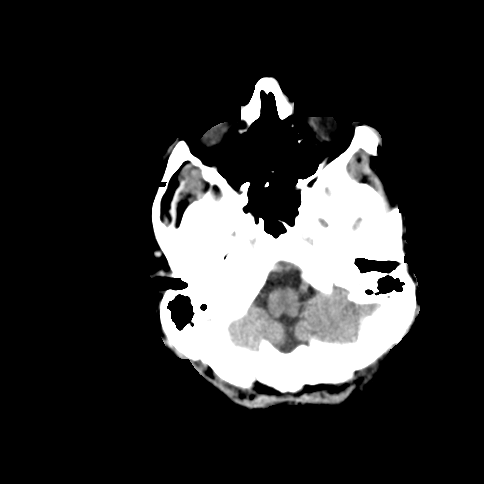
[im 7/30  brain]
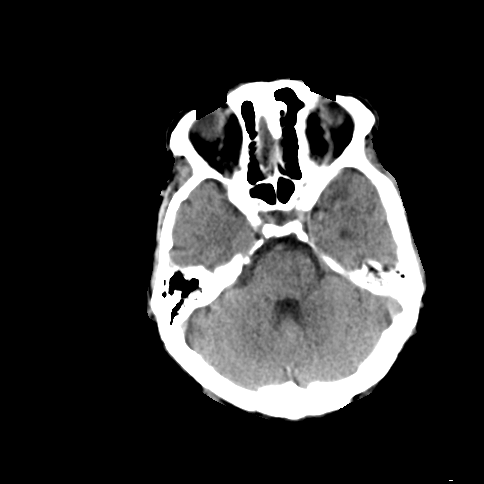
[im 11/30  brain]
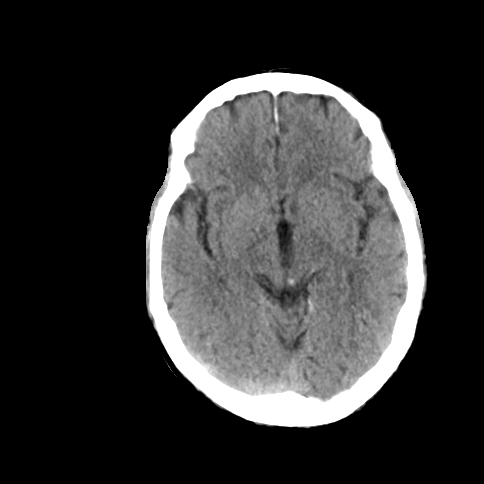

[16 of 30 positions shown; findings below may reference images not displayed]

FINDINGS: No acute intracranial hemorrhage. No focal mass lesion. No CT
evidence of acute infarction. No midline shift or mass effect. No
hydrocephalus. Basilar cisterns are patent.

Small lacunar infarct within the right thalamus is new from prior.
Mild generalized atrophy.

Paranasal sinuses and  mastoid air cells are clear.
IMPRESSION: 1. Small lacunar infarction in the right thalamus is age
indeterminate.
2. Mild generalized cortical atrophy.

## 2016-09-15 ENCOUNTER — Other Ambulatory Visit: Payer: Self-pay | Admitting: Internal Medicine

## 2016-09-15 DIAGNOSIS — I5022 Chronic systolic (congestive) heart failure: Secondary | ICD-10-CM

## 2016-09-15 DIAGNOSIS — E785 Hyperlipidemia, unspecified: Secondary | ICD-10-CM

## 2017-07-04 ENCOUNTER — Ambulatory Visit: Payer: Self-pay | Admitting: Urgent Care

## 2017-07-04 DIAGNOSIS — R6884 Jaw pain: Secondary | ICD-10-CM | POA: Diagnosis not present

## 2017-07-04 DIAGNOSIS — K047 Periapical abscess without sinus: Secondary | ICD-10-CM | POA: Diagnosis not present

## 2017-09-14 ENCOUNTER — Ambulatory Visit (HOSPITAL_COMMUNITY)
Admission: EM | Admit: 2017-09-14 | Discharge: 2017-09-14 | Disposition: A | Discharge status: Home / Self Care | Payer: Medicare HMO | Attending: Family Medicine | Admitting: Family Medicine

## 2017-09-14 ENCOUNTER — Other Ambulatory Visit: Payer: Self-pay | Admitting: Internal Medicine

## 2017-09-14 ENCOUNTER — Other Ambulatory Visit: Payer: Self-pay

## 2017-09-14 DIAGNOSIS — Z760 Encounter for issue of repeat prescription: Secondary | ICD-10-CM | POA: Diagnosis not present

## 2017-09-14 DIAGNOSIS — Z8639 Personal history of other endocrine, nutritional and metabolic disease: Secondary | ICD-10-CM

## 2017-09-14 DIAGNOSIS — Z8679 Personal history of other diseases of the circulatory system: Secondary | ICD-10-CM | POA: Diagnosis not present

## 2017-09-14 DIAGNOSIS — E785 Hyperlipidemia, unspecified: Secondary | ICD-10-CM

## 2017-09-14 DIAGNOSIS — I5022 Chronic systolic (congestive) heart failure: Secondary | ICD-10-CM

## 2017-09-14 MED ORDER — LISINOPRIL 5 MG PO TABS: 5.0000 mg | ORAL_TABLET | Freq: Every day | ORAL | 3 refills | Status: DC

## 2017-09-14 MED ORDER — FUROSEMIDE 40 MG PO TABS: 40.0000 mg | ORAL_TABLET | Freq: Every day | ORAL | 3 refills | Status: DC

## 2017-09-14 MED ORDER — BISOPROLOL FUMARATE 5 MG PO TABS: 5.0000 mg | ORAL_TABLET | Freq: Every day | ORAL | 3 refills | Status: DC

## 2017-09-14 MED ORDER — ATORVASTATIN CALCIUM 40 MG PO TABS: 40.0000 mg | ORAL_TABLET | Freq: Every day | ORAL | 3 refills | Status: DC

## 2017-09-14 NOTE — ED Triage Notes (Addendum)
Pt states his PCP at [facility] is no longer in practice there and he hasn't been able to get an appointment with a new PCP because he has not been back to them in 2 years.  Pt was prescribed Lisinopril 5 mg, Lipitor 40 mg, and Zebeta 5mg  back in March of 2017.  Pt states he currently takes 4 medications that he is about to run out of.

## 2017-09-14 NOTE — ED Provider Notes (Signed)
[facility]    CSN: [id number] Arrival date & time: 09/14/17  1542     History   Chief Complaint Chief Complaint  Patient presents with  . Medication Refill    HPI [name] [name] is a 67 y.o. male.   67 year old male, with history of CHF, GERD, previous stroke, presenting today for medication refill.  Patient states that he has been seen by the [facility] in the past but his primary care physician has since quit.  States that he is here today because his prescriptions are getting ready to expire.  He denies any complaints at this time.  The history is provided by the patient.  Medication Refill  Medications/supplies requested:  Lisinopril, Lasix, Zebeta, Lipitor Reason for request:  Clinic/provider not available Medications taken before: yes - see home medications   Patient has complete original prescription information: yes     Past Medical History:  Diagnosis Date  . Anxiety   . CHF (congestive heart failure) (HCC)   . GERD (gastroesophageal reflux disease)   . Heavy smoker   . Shortness of breath dyspnea   . Stroke [facility])    a. Noted on CT 07/29/14    Patient Active Problem List   Diagnosis Date Noted  . Colon cancer screening 09/14/2015  . Chronic systolic congestive heart failure (HCC) 09/14/2015  . Acute systolic congestive heart failure (HCC) 08/12/2014  . 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 Alcohol abuse 08/12/2014  . Cigarette nicotine dependence in remission 08/12/2014  . Dyslipidemia 08/12/2014  . COLD (chronic obstructive lung disease) (HCC)   . Thrombocytopenia (HCC)   . QT prolongation   . Korsakoff syndrome   . Chronic alcohol abuse   . Altered consciousness   . Shortness of breath   . SOB (shortness of breath)   . Atrial fibrillation (HCC) 07/24/2014  . CHF exacerbation (HCC) 07/22/2014  . Acute respiratory failure with hypoxia (HCC)   . Alcohol withdrawal (HCC)   . CAP (community acquired pneumonia)   . Acute respiratory  failure (HCC)   . Dyspnea   . CHF (congestive heart failure) (HCC) 07/21/2014    Past Surgical History:  Procedure Laterality Date  . LEFT HEART CATHETERIZATION WITH CORONARY ANGIOGRAM N/A 07/28/2014   Procedure: LEFT HEART CATHETERIZATION WITH CORONARY ANGIOGRAM;  Surgeon: Lesleigh Noe, MD;  Location: [facility] CATH LAB;  Service: Cardiovascular;  Laterality: N/A;  . LEG SURGERY    . SPINE SURGERY         Home Medications    Prior to Admission medications   Medication Sig Start Date End Date Taking? Authorizing Provider  atorvastatin (LIPITOR) 40 MG tablet Take 1 tablet (40 mg total) by mouth daily. 09/14/17   Blue, Olivia C, PA-C  bisoprolol (ZEBETA) 5 MG tablet Take 1 tablet (5 mg total) by mouth daily. 09/14/17   Blue, Olivia C, PA-C  folic acid (FOLVITE) 1 MG tablet Take 1 tablet (1 mg total) by mouth daily. 08/12/14   Quentin Angst, MD  furosemide (LASIX) 40 MG tablet Take 1 tablet (40 mg total) by mouth daily. 09/14/17   Blue, Olivia C, PA-C  lisinopril (PRINIVIL,ZESTRIL) 5 MG tablet Take 1 tablet (5 mg total) by mouth daily. 09/14/17   Blue, Olivia C, PA-C  nicotine (NICODERM CQ - DOSED IN MG/24 HOURS) 14 mg/24hr patch Place 1 patch (14 mg total) onto the skin daily. Patient not taking: Reported on 09/14/2015 08/12/14   Quentin Angst, MD  thiamine 100 MG tablet  Take 1 tablet (100 mg total) by mouth daily. Patient not taking: Reported on 09/14/2015 08/12/14   Quentin AngstJegede, Olugbemiga E, MD    Family History No family history on file.  Social History Social History   Tobacco Use  . Smoking status: Former Smoker    Types: Cigarettes    Last attempt to quit: 07/22/2014    Years since quitting: 3.1  Substance Use Topics  . Alcohol use: Yes    Alcohol/week: 105.0 oz    Types: 175 Cans of beer per week  . Drug use: No     Allergies   Levaquin [levofloxacin in d5w]   Review of Systems Review of Systems  Constitutional: Negative for chills and fever.  HENT: Negative  for ear pain and sore throat.   Eyes: Negative for pain and visual disturbance.  Respiratory: Negative for cough and shortness of breath.   Cardiovascular: Negative for chest pain and palpitations.  Gastrointestinal: Negative for abdominal pain and vomiting.  Genitourinary: Negative for dysuria and hematuria.  Musculoskeletal: Negative for arthralgias and back pain.  Skin: Negative for color change and rash.  Neurological: Negative for seizures and syncope.  All other systems reviewed and are negative.    Physical Exam Triage Vital Signs ED Triage Vitals  Enc Vitals Group     BP 09/14/17 1607 113/69     Pulse Rate 09/14/17 1607 67     Resp --      Temp 09/14/17 1607 97.7 F (36.5 C)     Temp Source 09/14/17 1607 Oral     SpO2 09/14/17 1607 96 %     Weight --      Height --      Head Circumference --      Peak Flow --      Pain Score 09/14/17 1605 0     Pain Loc --      Pain Edu? --      Excl. in GC? --    No data found.  Updated Vital Signs BP 113/69 (BP Location: Right Arm)   Pulse 67   Temp 97.7 F (36.5 C) (Oral)   SpO2 96%   Visual Acuity Right Eye Distance:   Left Eye Distance:   Bilateral Distance:    Right Eye Near:   Left Eye Near:    Bilateral Near:     Physical Exam  Constitutional: He appears well-developed and well-nourished.  HENT:  Head: Normocephalic and atraumatic.  Eyes: Conjunctivae are normal.  Neck: Neck supple.  Cardiovascular: Normal rate and regular rhythm.  No murmur heard. Pulmonary/Chest: Effort normal and breath sounds normal. No respiratory distress.  Abdominal: Soft. There is no tenderness.  Musculoskeletal: He exhibits no edema.  Neurological: He is alert.  Skin: Skin is warm and dry.  Psychiatric: He has a normal mood and affect.  Nursing note and vitals reviewed.    UC Treatments / Results  Labs (all labs ordered are listed, but only abnormal results are displayed) Labs Reviewed - No data to display  EKG  EKG  Interpretation None       Radiology No results found.  Procedures Procedures (including critical care time)  Medications Ordered in UC Medications - No data to display   Initial Impression / Assessment and Plan / UC Course  I have reviewed the triage vital signs and the nursing notes.  Pertinent labs & imaging results that were available during my care of the patient were reviewed by me and considered in my medical  decision making (see chart for details).     Meds refilled.  PCP assistance requested  Final Clinical Impressions(s) / UC Diagnoses   Final diagnoses:  Medication refill    ED Discharge Orders        Ordered    atorvastatin (LIPITOR) 40 MG tablet  Daily     09/14/17 1613    bisoprolol (ZEBETA) 5 MG tablet  Daily     09/14/17 1613    furosemide (LASIX) 40 MG tablet  Daily     09/14/17 1613    lisinopril (PRINIVIL,ZESTRIL) 5 MG tablet  Daily     09/14/17 1613       Controlled Substance Prescriptions [place] Controlled Substance Registry consulted? Not Applicable   Alecia Lemming, [place] 09/14/17 1610

## 2018-09-12 ENCOUNTER — Ambulatory Visit (HOSPITAL_COMMUNITY): Admission: EM | Admit: 2018-09-12 | Discharge: 2018-09-12 | Discharge status: Absent without leave | Payer: Medicare HMO

## 2018-09-13 ENCOUNTER — Other Ambulatory Visit: Payer: Self-pay

## 2018-09-13 ENCOUNTER — Ambulatory Visit (HOSPITAL_COMMUNITY)
Admission: EM | Admit: 2018-09-13 | Discharge: 2018-09-13 | Disposition: A | Discharge status: Home / Self Care | Payer: Medicare HMO | Attending: Family Medicine | Admitting: Family Medicine

## 2018-09-13 ENCOUNTER — Encounter (HOSPITAL_COMMUNITY): Payer: Self-pay | Admitting: Emergency Medicine

## 2018-09-13 ENCOUNTER — Telehealth (HOSPITAL_COMMUNITY): Payer: Self-pay | Admitting: Emergency Medicine

## 2018-09-13 DIAGNOSIS — E785 Hyperlipidemia, unspecified: Secondary | ICD-10-CM | POA: Diagnosis not present

## 2018-09-13 DIAGNOSIS — Z760 Encounter for issue of repeat prescription: Secondary | ICD-10-CM | POA: Diagnosis not present

## 2018-09-13 DIAGNOSIS — I5022 Chronic systolic (congestive) heart failure: Secondary | ICD-10-CM

## 2018-09-13 DIAGNOSIS — I10 Essential (primary) hypertension: Secondary | ICD-10-CM | POA: Insufficient documentation

## 2018-09-13 LAB — BASIC METABOLIC PANEL
Anion gap: 10 (ref 5–15)
BUN: 36 mg/dL — AB (ref 8–23)
CALCIUM: 9.4 mg/dL (ref 8.9–10.3)
CO2: 22 mmol/L (ref 22–32)
CREATININE: 1.75 mg/dL — AB (ref 0.61–1.24)
Chloride: 103 mmol/L (ref 98–111)
GFR calc Af Amer: 45 mL/min — ABNORMAL LOW (ref >60)
GFR calc non Af Amer: 39 mL/min — ABNORMAL LOW (ref >60)
Glucose, Bld: 129 mg/dL — ABNORMAL HIGH (ref 70–99)
Potassium: 4.3 mmol/L (ref 3.5–5.1)
Sodium: 135 mmol/L (ref 135–145)

## 2018-09-13 MED ORDER — ATORVASTATIN CALCIUM 40 MG PO TABS: 40.0000 mg | ORAL_TABLET | Freq: Every day | ORAL | 0 refills | Status: DC

## 2018-09-13 MED ORDER — FUROSEMIDE 40 MG PO TABS: 40.0000 mg | ORAL_TABLET | Freq: Every day | ORAL | 0 refills | Status: DC

## 2018-09-13 MED ORDER — BISOPROLOL FUMARATE 5 MG PO TABS: 5.0000 mg | ORAL_TABLET | Freq: Every day | ORAL | 0 refills | Status: DC

## 2018-09-13 MED ORDER — LISINOPRIL 5 MG PO TABS: 5.0000 mg | ORAL_TABLET | Freq: Every day | ORAL | 0 refills | Status: DC

## 2018-09-13 NOTE — ED Provider Notes (Signed)
[facility]   [id number] 09/13/18 Arrival Time: 1048  ASSESSMENT & PLAN:  1. Essential hypertension   2. Chronic systolic congestive heart failure (HCC)   3. Dyslipidemia   4. Hyperlipidemia, unspecified hyperlipidemia type   5. Medication refill    Meds ordered this encounter  Medications  . bisoprolol (ZEBETA) 5 MG tablet    Sig: Take 1 tablet (5 mg total) by mouth daily.    Dispense:  90 tablet    Refill:  0  . lisinopril (PRINIVIL,ZESTRIL) 5 MG tablet    Sig: Take 1 tablet (5 mg total) by mouth daily.    Dispense:  90 tablet    Refill:  0  . atorvastatin (LIPITOR) 40 MG tablet    Sig: Take 1 tablet (40 mg total) by mouth daily.    Dispense:  90 tablet    Refill:  0  . furosemide (LASIX) 40 MG tablet    Sig: Take 1 tablet (40 mg total) by mouth daily.    Dispense:  90 tablet    Refill:  0   Recommend that he call [facility] or [place] to schedule an appt with a new PCP. Numbers given on AVS.  Labs Pending  BASIC METABOLIC PANEL  Has been several years since checked.  Reviewed expectations re: course of current medical issues. Questions answered. Outlined signs and symptoms indicating need for more acute intervention. Patient verbalized understanding. After Visit Summary given.   SUBJECTIVE: History from: patient.  [name] [name]. is a 68 y.o. male who presents requesting medication refill. No current concerns. Mr Loch reports that has been a pt at [facility] and states both of his doctors have left the practice. He reports they have not assigned him a new PCP in the last year and they have sent him here for medication refills.  Current medical problems include: Past Medical History:  Diagnosis Date  . Anxiety   . CHF (congestive heart failure) (HCC)   . GERD (gastroesophageal reflux disease)   . Heavy smoker   . Shortness of breath dyspnea   . Stroke [facility])    a. Noted on CT 07/29/14   Current Outpatient  Medications:  .  atorvastatin (LIPITOR) 40 MG tablet, Take 1 tablet (40 mg total) by mouth daily., Disp: 90 tablet, Rfl: 3 .  bisoprolol (ZEBETA) 5 MG tablet, Take 1 tablet (5 mg total) by mouth daily., Disp: 90 tablet, Rfl: 3 .  furosemide (LASIX) 40 MG tablet, Take 1 tablet (40 mg total) by mouth daily., Disp: 90 tablet, Rfl: 3 .  lisinopril (PRINIVIL,ZESTRIL) 5 MG tablet, Take 1 tablet (5 mg total) by mouth daily., Disp: 90 tablet, Rfl: 3 .  folic acid (FOLVITE) 1 MG tablet, Take 1 tablet (1 mg total) by mouth daily., Disp: 90 tablet, Rfl: 3 .  nicotine (NICODERM CQ - DOSED IN MG/24 HOURS) 14 mg/24hr patch, Place 1 patch (14 mg total) onto the skin daily. (Patient not taking: Reported on 09/14/2015), Disp: 28 patch, Rfl: 0 .  thiamine 100 MG tablet, Take 1 tablet (100 mg total) by mouth daily. (Patient not taking: Reported on 09/14/2015), Disp: 90 tablet, Rfl: 3  For Reqested Medication Refill: Reason for request:  clinic/provider not available Medications taken before: yes - see home medications   Patient has complete original prescription information: Yes   ROS: As per HPI. All other systems negative.   OBJECTIVE:  Vitals:   09/13/18 1059  BP: 124/76  Pulse:  78  Resp: (!) 24  Temp: 98.2 F (36.8 C)  TempSrc: Oral  SpO2: 94%    Recheck RR: 18  General appearance: alert; no distress Eyes: PERRLA; EOMI; conjunctiva normal HENT: normocephalic; atraumatic Neck: supple  Lungs: clear to auscultation bilaterally Heart: regular rate and rhythm Abdomen: soft, non-tender Back: no CVA tenderness Extremities: no cyanosis or edema; symmetrical with no gross deformities Skin: warm and dry Neurologic: normal gait Psychological: alert and cooperative; normal mood and affect   Allergies  Allergen Reactions  . Levaquin [Levofloxacin In D5w]     Generalized redness; received azithromycin, rocephin, and levaquin on the same day as reaction developed.    Social History    Socioeconomic History  . Marital status: Single    Spouse name: Not on file  . Number of children: Not on file  . Years of education: Not on file  . Highest education level: Not on file  Occupational History  . Not on file  Social Needs  . Financial resource strain: Not on file  . Food insecurity:    Worry: Not on file    Inability: Not on file  . Transportation needs:    Medical: Not on file    Non-medical: Not on file  Tobacco Use  . Smoking status: Former Smoker    Types: Cigarettes    Last attempt to quit: 07/22/2014    Years since quitting: 4.1  . Smokeless tobacco: Never Used  Substance and Sexual Activity  . Alcohol use: Yes    Alcohol/week: 175.0 standard drinks    Types: 175 Cans of beer per week  . Drug use: No  . Sexual activity: Not Currently  Lifestyle  . Physical activity:    Days per week: Not on file    Minutes per session: Not on file  . Stress: Not on file  Relationships  . Social connections:    Talks on phone: Not on file    Gets together: Not on file    Attends religious service: Not on file    Active member of club or organization: Not on file    Attends meetings of clubs or organizations: Not on file    Relationship status: Not on file  . Intimate partner violence:    Fear of current or ex partner: Not on file    Emotionally abused: Not on file    Physically abused: Not on file    Forced sexual activity: Not on file  Other Topics Concern  . Not on file  Social History Narrative  . Not on file   FH: HTN  Past Surgical History:  Procedure Laterality Date  . LEFT HEART CATHETERIZATION WITH CORONARY ANGIOGRAM N/A 07/28/2014   Procedure: LEFT HEART CATHETERIZATION WITH CORONARY ANGIOGRAM;  Surgeon: Lesleigh Noe, MD;  Location: [facility] CATH LAB;  Service: Cardiovascular;  Laterality: N/A;  . LEG SURGERY    . SPINE SURGERY       Mardella Layman, MD 09/13/18 1118

## 2018-09-13 NOTE — ED Triage Notes (Signed)
Pt was a pt at [facility] and states both of his doctors have left the practice.  He states they have not assigned him a new PCP in the last year and they have sent him here for medication refills.

## 2018-09-13 NOTE — Telephone Encounter (Signed)
Patient contacted and made aware of all results, all questions answered. Per Dr. Tracie Harrier, pt needs PCP, needs recheck of fasting cbg and BMP in 3-4 weeks, and to ensure adequate hydration in the meantime. Pt agreeable to plan. All questions answered.

## 2018-09-13 NOTE — ED Notes (Signed)
Patient verbalizes understanding of discharge instructions. Opportunity for questioning and answers were provided. Patient discharged from [facility] by RN.  

## 2019-01-16 ENCOUNTER — Ambulatory Visit
Admission: EM | Admit: 2019-01-16 | Discharge: 2019-01-16 | Disposition: A | Discharge status: Home / Self Care | Payer: Medicare HMO | Attending: Emergency Medicine | Admitting: Emergency Medicine

## 2019-01-16 ENCOUNTER — Other Ambulatory Visit: Payer: Self-pay

## 2019-01-16 ENCOUNTER — Encounter: Payer: Self-pay | Admitting: Emergency Medicine

## 2019-01-16 DIAGNOSIS — E785 Hyperlipidemia, unspecified: Secondary | ICD-10-CM

## 2019-01-16 DIAGNOSIS — I10 Essential (primary) hypertension: Secondary | ICD-10-CM | POA: Diagnosis not present

## 2019-01-16 DIAGNOSIS — R69 Illness, unspecified: Secondary | ICD-10-CM | POA: Diagnosis not present

## 2019-01-16 DIAGNOSIS — I509 Heart failure, unspecified: Secondary | ICD-10-CM

## 2019-01-16 DIAGNOSIS — Z760 Encounter for issue of repeat prescription: Secondary | ICD-10-CM

## 2019-01-16 DIAGNOSIS — F1010 Alcohol abuse, uncomplicated: Secondary | ICD-10-CM

## 2019-01-16 DIAGNOSIS — I5022 Chronic systolic (congestive) heart failure: Secondary | ICD-10-CM

## 2019-01-16 MED ORDER — ATORVASTATIN CALCIUM 40 MG PO TABS: 40.0000 mg | ORAL_TABLET | Freq: Every day | ORAL | 0 refills | Status: DC

## 2019-01-16 MED ORDER — LISINOPRIL 5 MG PO TABS: 5.0000 mg | ORAL_TABLET | Freq: Every day | ORAL | 0 refills | Status: DC

## 2019-01-16 MED ORDER — FUROSEMIDE 40 MG PO TABS: 40.0000 mg | ORAL_TABLET | Freq: Every day | ORAL | 0 refills | Status: DC

## 2019-01-16 MED ORDER — BISOPROLOL FUMARATE 5 MG PO TABS: 5.0000 mg | ORAL_TABLET | Freq: Every day | ORAL | 0 refills | Status: DC

## 2019-01-16 NOTE — ED Notes (Signed)
During patient's triage this RN asked patient if he had been able to find a PCP since his last [facility] visit.  Patient attempted to walk-in to PCP side prior to visiting and they stated they do not take walk-ins and sent him here.  Patient had a 90 day refill in March from [facility].  Patient began to argue with RN and explain that he is unable to get an appointment for six weeks.  This RN offered to walk patient over after his visit to schedule an appointment for the future to assist patient with his NEXT refill.  Patient began to tell this RN that we just wanted him to die, and he's waiting for a friend to help him get in with a PCP.  Patient continued to complain at this point, and this RN apologized for attempting to help, and attempted to explain rationale for having these types of medications followed and ordered by a PCP.  Patient told this RN she did not care, and this RN left room to allow NP to enter and speak with patient.

## 2019-01-16 NOTE — ED Provider Notes (Signed)
[facility]    CSN: [id number] Arrival date & time: 01/16/19  1022     History   Chief Complaint Chief Complaint  Patient presents with  . Medication Refill    HPI Graydon Fofana. is a 68 y.o. male.   [employer]. presents with requests for medication refill. He has been out of his medications for the past 1-2 weeks. He was seen with urgent care 3/19 and received a 90 day supply of his medications. He has not been following with a PCP, stating he has not been able to get an appointment. Hx of anxiety, chf, gerd, stroke, alcohol abuse, COPD, thrombocytopenia, afib. Has had heart caths and echo's in the past. Uses home oxygen, does not bring this in with him, left it in his car. Denies any new complaints. No chest pain, no new shortness of breath, no leg pain or swelling. BMP collected at last visit, noted some elevation in creatinine.     ROS per HPI, negative if not otherwise mentioned.      Past Medical History:  Diagnosis Date  . Anxiety   . CHF (congestive heart failure) ([place])   . GERD (gastroesophageal reflux disease)   . Heavy smoker   . Shortness of breath dyspnea   . Stroke [facility])    a. Noted on CT 07/29/14    Patient Active Problem List   Diagnosis Date Noted  . Colon cancer screening 09/14/2015  . Chronic systolic congestive heart failure ([place]) 09/14/2015  . Acute systolic congestive heart failure ([place]) 08/12/2014  . 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 Alcohol abuse 08/12/2014  . Cigarette nicotine dependence in remission 08/12/2014  . Dyslipidemia 08/12/2014  . COLD (chronic obstructive lung disease) ([place])   . Thrombocytopenia ([place])   . QT prolongation   . 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 Chronic alcohol abuse   . Altered consciousness   . Shortness of breath   . SOB (shortness of breath)   . Atrial fibrillation ([place]) 07/24/2014  . CHF exacerbation ([place]) 07/22/2014  . Acute respiratory failure with hypoxia ([place])   . Alcohol withdrawal ([place])    . CAP (community acquired pneumonia)   . Acute respiratory failure ([place])   . Dyspnea   . CHF (congestive heart failure) ([place]) 07/21/2014    Past Surgical History:  Procedure Laterality Date  . LEFT HEART CATHETERIZATION WITH CORONARY ANGIOGRAM N/A 07/28/2014   Procedure: LEFT HEART CATHETERIZATION WITH CORONARY ANGIOGRAM;  Surgeon: Sinclair Grooms, MD;  Location: [facility] CATH LAB;  Service: Cardiovascular;  Laterality: N/A;  . LEG SURGERY    . SPINE SURGERY         Home Medications    Prior to Admission medications   Medication Sig Start Date End Date Taking? Authorizing Provider  atorvastatin (LIPITOR) 40 MG tablet Take 1 tablet (40 mg total) by mouth daily. 01/16/19   Zigmund Gottron, NP  bisoprolol (ZEBETA) 5 MG tablet Take 1 tablet (5 mg total) by mouth daily. 01/16/19   Zigmund Gottron, NP  folic acid (FOLVITE) 1 MG tablet Take 1 tablet (1 mg total) by mouth daily. 08/12/14   Tresa Garter, MD  furosemide (LASIX) 40 MG tablet Take 1 tablet (40 mg total) by mouth daily. 01/16/19   Augusto Gamble B, NP  lisinopril (ZESTRIL) 5 MG tablet Take 1 tablet (5 mg total) by mouth daily. 01/16/19   Augusto Gamble B, NP  nicotine (NICODERM CQ - DOSED IN MG/24 HOURS) 14  mg/24hr patch Place 1 patch (14 mg total) onto the skin daily. Patient not taking: Reported on 09/14/2015 08/12/14   Quentin AngstJegede, Olugbemiga E, MD  thiamine 100 MG tablet Take 1 tablet (100 mg total) by mouth daily. Patient not taking: Reported on 09/14/2015 08/12/14   Quentin AngstJegede, Olugbemiga E, MD    Family History History reviewed. No pertinent family history.  Social History Social History   Tobacco Use  . Smoking status: Former Smoker    Types: Cigarettes    Quit date: 07/22/2014    Years since quitting: 4.4  . Smokeless tobacco: Never Used  Substance Use Topics  . Alcohol use: Yes    Alcohol/week: 175.0 standard drinks    Types: 175 Cans of beer per week  . Drug use: No     Allergies   Levaquin [levofloxacin in d5w]    Review of Systems Review of Systems   Physical Exam Triage Vital Signs ED Triage Vitals  Enc Vitals Group     BP 01/16/19 1033 (!) 153/85     Pulse Rate 01/16/19 1033 79     Resp 01/16/19 1033 18     Temp 01/16/19 1033 97.8 F (36.6 C)     Temp Source 01/16/19 1033 Oral     SpO2 01/16/19 1033 91 %     Weight --      Height --      Head Circumference --      Peak Flow --      Pain Score 01/16/19 1036 0     Pain Loc --      Pain Edu? --      Excl. in GC? --    No data found.  Updated Vital Signs BP (!) 153/85 (BP Location: Left Arm)   Pulse 79   Temp 97.8 F (36.6 C) (Oral)   Resp 18   SpO2 91%    Physical Exam Constitutional:      Appearance: He is well-developed.  Cardiovascular:     Rate and Rhythm: Normal rate.  Pulmonary:     Effort: Pulmonary effort is normal.     Breath sounds: Normal breath sounds.     Comments: Noted mild tachypnea as patient is speaking quickly and mildly upset during visit; no cough, no wheezes; noted O2 Musculoskeletal:     Right lower leg: No edema.     Left lower leg: No edema.  Skin:    General: Skin is warm and dry.  Neurological:     Mental Status: He is alert and oriented to person, place, and time.      UC Treatments / Results  Labs (all labs ordered are listed, but only abnormal results are displayed) Labs Reviewed - No data to display  EKG   Radiology No results found.  Procedures Procedures (including critical care time)  Medications Ordered in UC Medications - No data to display  Initial Impression / Assessment and Plan / UC Course  I have reviewed the triage vital signs and the nursing notes.  Pertinent labs & imaging results that were available during my care of the patient were reviewed by me and considered in my medical decision making (see chart for details).     Medication refill for 30 days provided, as patient provided with 90 supply at last visit. Patient upset about this. Emphasized  importance of establishing care to ensure adequate management of his chronic illnesses, rather than refills from urgent care. No acute complaints or obvious findings here today. Resources provided  for PCP. Patient verbalized understanding. Ambulatory out of clinic without difficulty.   Final Clinical Impressions(s) / UC Diagnoses   Final diagnoses:  Medication refill  Essential hypertension  Chronic congestive heart failure, unspecified heart failure type [facility](HCC)     Discharge Instructions     Medications have been refilled . Please make appointment with primary care provider for further refill and management of your medications and chronic illnesses.      ED Prescriptions    Medication Sig Dispense Auth. Provider   atorvastatin (LIPITOR) 40 MG tablet Take 1 tablet (40 mg total) by mouth daily. 30 tablet Linus MakoBurky, Christelle Igoe B, NP   bisoprolol (ZEBETA) 5 MG tablet Take 1 tablet (5 mg total) by mouth daily. 30 tablet Linus MakoBurky, Brelee Renk B, NP   furosemide (LASIX) 40 MG tablet Take 1 tablet (40 mg total) by mouth daily. 30 tablet Linus MakoBurky, Adrianah Prophete B, NP   lisinopril (ZESTRIL) 5 MG tablet Take 1 tablet (5 mg total) by mouth daily. 30 tablet Georgetta HaberBurky, Ahyana Skillin B, NP     Controlled Substance Prescriptions [place] Controlled Substance Registry consulted? Not Applicable   Georgetta HaberBurky, Markail Diekman B, NP 01/16/19 1122

## 2019-01-16 NOTE — ED Notes (Signed)
Patient SpO2 91% on RA.  Patient asked if he should be on O2 (as seen in hx).  Patient states he left it in the car by choice.  Patient breathing heavy during triage.  APP aware.

## 2019-01-16 NOTE — ED Triage Notes (Signed)
Pt presents to [facility] to get a refill on his home medications.  Patient states he has not been able to get in with a new PCP

## 2019-01-16 NOTE — Discharge Instructions (Signed)
Medications have been refilled . Please make appointment with primary care provider for further refill and management of your medications and chronic illnesses.

## 2019-01-16 NOTE — ED Notes (Signed)
Patient able to ambulate independently  

## 2020-09-30 ENCOUNTER — Other Ambulatory Visit: Payer: Self-pay

## 2020-09-30 ENCOUNTER — Encounter (HOSPITAL_COMMUNITY): Payer: Self-pay

## 2020-09-30 ENCOUNTER — Ambulatory Visit (HOSPITAL_COMMUNITY)
Admission: RE | Admit: 2020-09-30 | Discharge: 2020-09-30 | Disposition: A | Discharge status: Home / Self Care | Payer: Medicare HMO | Source: Ambulatory Visit | Attending: Family Medicine | Admitting: Family Medicine

## 2020-09-30 VITALS — BP 125/75 | HR 58 | Temp 97.6°F | Resp 17

## 2020-09-30 DIAGNOSIS — Z760 Encounter for issue of repeat prescription: Secondary | ICD-10-CM

## 2020-09-30 DIAGNOSIS — I5022 Chronic systolic (congestive) heart failure: Secondary | ICD-10-CM

## 2020-09-30 DIAGNOSIS — E785 Hyperlipidemia, unspecified: Secondary | ICD-10-CM | POA: Diagnosis not present

## 2020-09-30 DIAGNOSIS — I110 Hypertensive heart disease with heart failure: Secondary | ICD-10-CM | POA: Diagnosis not present

## 2020-09-30 MED ORDER — LISINOPRIL 5 MG PO TABS: 5.0000 mg | ORAL_TABLET | Freq: Every day | ORAL | 2 refills | Status: DC

## 2020-09-30 MED ORDER — BISOPROLOL FUMARATE 5 MG PO TABS: 5.0000 mg | ORAL_TABLET | Freq: Every day | ORAL | 2 refills | Status: DC

## 2020-09-30 MED ORDER — ATORVASTATIN CALCIUM 40 MG PO TABS: 40.0000 mg | ORAL_TABLET | Freq: Every day | ORAL | 2 refills | Status: DC

## 2020-09-30 MED ORDER — FUROSEMIDE 40 MG PO TABS: 40.0000 mg | ORAL_TABLET | Freq: Every day | ORAL | 2 refills | Status: DC

## 2020-09-30 NOTE — ED Triage Notes (Signed)
Pt presents for medication refill. Bisoprolol, lisinopril, atorvastatin, and lasix.

## 2020-09-30 NOTE — ED Provider Notes (Signed)
[facility]   [id number] 09/30/20 Arrival Time: 1155  ASSESSMENT & PLAN:  1. Chronic systolic congestive heart failure (HCC)   2. Dyslipidemia    Stable chronic illnesses. Has appt with new PCP within 90 days. Needs medications.  Meds ordered this encounter  Medications  . atorvastatin (LIPITOR) 40 MG tablet    Sig: Take 1 tablet (40 mg total) by mouth daily.    Dispense:  30 tablet    Refill:  2  . bisoprolol (ZEBETA) 5 MG tablet    Sig: Take 1 tablet (5 mg total) by mouth daily.    Dispense:  30 tablet    Refill:  2  . furosemide (LASIX) 40 MG tablet    Sig: Take 1 tablet (40 mg total) by mouth daily.    Dispense:  30 tablet    Refill:  2  . lisinopril (ZESTRIL) 5 MG tablet    Sig: Take 1 tablet (5 mg total) by mouth daily.    Dispense:  30 tablet    Refill:  2    Reviewed expectations re: course of current medical issues. Questions answered. Outlined signs and symptoms indicating need for more acute intervention. Patient verbalized understanding. After Visit Summary given.   SUBJECTIVE: History from: patient. [name] [name]. is a 70 y.o. male who presents requesting medication refill. No current concerns. Feels at baseline. No SOB or CP. No significant LE edema. Taking meds as directed. Will run out soon.  Current medical problems include: Past Medical History:  Diagnosis Date  . Anxiety   . CHF (congestive heart failure) (HCC)   . GERD (gastroesophageal reflux disease)   . Heavy smoker   . Shortness of breath dyspnea   . Stroke [facility])    a. Noted on CT 07/29/14     Current Outpatient Medications (Cardiovascular):  .  atorvastatin (LIPITOR) 40 MG tablet, Take 1 tablet (40 mg total) by mouth daily. .  bisoprolol (ZEBETA) 5 MG tablet, Take 1 tablet (5 mg total) by mouth daily. .  furosemide (LASIX) 40 MG tablet, Take 1 tablet (40 mg total) by mouth daily. Marland Kitchen  lisinopril (ZESTRIL) 5 MG tablet, Take 1 tablet (5 mg total) by mouth daily.  Current  Outpatient Medications (Hematological):  .  folic acid (FOLVITE) 1 MG tablet, Take 1 tablet (1 mg total) by mouth daily.  Current Outpatient Medications (Other):  .  nicotine (NICODERM CQ - DOSED IN MG/24 HOURS) 14 mg/24hr patch, Place 1 patch (14 mg total) onto the skin daily. (Patient not taking: Reported on 09/14/2015) .  thiamine 100 MG tablet, Take 1 tablet (100 mg total) by mouth daily. (Patient not taking: Reported on 09/14/2015) No current facility-administered medications for this encounter.    OBJECTIVE:  Vitals:   09/30/20 1220  BP: 125/75  Pulse: (!) 58  Resp: 17  Temp: 97.6 F (36.4 C)  TempSrc: Oral  SpO2: 98%    General appearance: alert; no distress Eyes: PERRLA; EOMI; conjunctiva normal HENT: normocephalic; atraumatic; TMs normal; nasal mucosa normal; oral mucosa normal Neck: supple  Lungs: clear to auscultation bilaterally Heart: regular Abdomen: soft Back: no CVA tenderness Extremities: mild LE edema; symmetrical with no gross deformities Skin: warm and dry Neurologic: normal gait; normal symmetric reflexes Psychological: alert and cooperative; normal mood and affect   Allergies  Allergen Reactions  . Levaquin [Levofloxacin In D5w]     Generalized redness; received azithromycin, rocephin, and levaquin on the same day as reaction developed.    Social  History   Socioeconomic History  . Marital status: Single    Spouse name: Not on file  . Number of children: Not on file  . Years of education: Not on file  . Highest education level: Not on file  Occupational History  . Not on file  Tobacco Use  . Smoking status: Former Smoker    Types: Cigarettes    Quit date: 07/22/2014    Years since quitting: 6.1  . Smokeless tobacco: Never Used  Vaping Use  . Vaping Use: Never used  Substance and Sexual Activity  . Alcohol use: Yes    Alcohol/week: 175.0 standard drinks    Types: 175 Cans of beer per week  . Drug use: No  . Sexual activity: Not  Currently  Other Topics Concern  . Not on file  Social History Narrative  . Not on file   Social Determinants of Health   Financial Resource Strain: Not on file  Food Insecurity: Not on file  Transportation Needs: Not on file  Physical Activity: Not on file  Stress: Not on file  Social Connections: Not on file  Intimate Partner Violence: Not on file   History reviewed. No pertinent family history. Past Surgical History:  Procedure Laterality Date  . LEFT HEART CATHETERIZATION WITH CORONARY ANGIOGRAM N/A 07/28/2014   Procedure: LEFT HEART CATHETERIZATION WITH CORONARY ANGIOGRAM;  Surgeon: Lesleigh Noe, MD;  Location: [facility] CATH LAB;  Service: Cardiovascular;  Laterality: N/A;  . LEG SURGERY    . SPINE SURGERY       Mardella Layman, MD 09/30/20 [phone]

## 2020-10-06 DIAGNOSIS — H268 Other specified cataract: Secondary | ICD-10-CM | POA: Diagnosis not present

## 2020-10-06 DIAGNOSIS — Z01818 Encounter for other preprocedural examination: Secondary | ICD-10-CM | POA: Diagnosis not present

## 2020-10-06 DIAGNOSIS — H2512 Age-related nuclear cataract, left eye: Secondary | ICD-10-CM | POA: Diagnosis not present

## 2020-10-08 DIAGNOSIS — H2511 Age-related nuclear cataract, right eye: Secondary | ICD-10-CM | POA: Diagnosis not present

## 2020-10-08 DIAGNOSIS — H268 Other specified cataract: Secondary | ICD-10-CM | POA: Diagnosis not present

## 2020-10-13 ENCOUNTER — Inpatient Hospital Stay: Payer: Medicare HMO | Admitting: Nurse Practitioner

## 2020-10-27 ENCOUNTER — Inpatient Hospital Stay: Payer: Medicare HMO | Admitting: Internal Medicine

## 2020-10-29 DIAGNOSIS — H2512 Age-related nuclear cataract, left eye: Secondary | ICD-10-CM | POA: Diagnosis not present

## 2020-10-29 DIAGNOSIS — H268 Other specified cataract: Secondary | ICD-10-CM | POA: Diagnosis not present

## 2020-12-10 ENCOUNTER — Encounter: Payer: Self-pay | Admitting: Internal Medicine

## 2020-12-10 ENCOUNTER — Ambulatory Visit: Payer: Medicare HMO | Attending: Nurse Practitioner | Admitting: Internal Medicine

## 2020-12-10 ENCOUNTER — Other Ambulatory Visit: Payer: Self-pay

## 2020-12-10 VITALS — BP 136/68 | HR 71 | Resp 16 | Ht 71.0 in | Wt 156.4 lb

## 2020-12-10 DIAGNOSIS — J449 Chronic obstructive pulmonary disease, unspecified: Secondary | ICD-10-CM | POA: Diagnosis not present

## 2020-12-10 DIAGNOSIS — Z9981 Dependence on supplemental oxygen: Secondary | ICD-10-CM | POA: Diagnosis not present

## 2020-12-10 DIAGNOSIS — J9611 Chronic respiratory failure with hypoxia: Secondary | ICD-10-CM

## 2020-12-10 DIAGNOSIS — I5022 Chronic systolic (congestive) heart failure: Secondary | ICD-10-CM | POA: Diagnosis not present

## 2020-12-10 DIAGNOSIS — Z23 Encounter for immunization: Secondary | ICD-10-CM

## 2020-12-10 DIAGNOSIS — N289 Disorder of kidney and ureter, unspecified: Secondary | ICD-10-CM

## 2020-12-10 DIAGNOSIS — E785 Hyperlipidemia, unspecified: Secondary | ICD-10-CM

## 2020-12-10 DIAGNOSIS — Z125 Encounter for screening for malignant neoplasm of prostate: Secondary | ICD-10-CM

## 2020-12-10 DIAGNOSIS — Z1211 Encounter for screening for malignant neoplasm of colon: Secondary | ICD-10-CM

## 2020-12-10 DIAGNOSIS — R634 Abnormal weight loss: Secondary | ICD-10-CM | POA: Diagnosis not present

## 2020-12-10 MED ORDER — ATORVASTATIN CALCIUM 40 MG PO TABS: 40.0000 mg | ORAL_TABLET | Freq: Every day | ORAL | 1 refills | Status: DC

## 2020-12-10 MED ORDER — FLUTICASONE-SALMETEROL 100-50 MCG/ACT IN AEPB: 1.0000 | INHALATION_SPRAY | Freq: Two times a day (BID) | RESPIRATORY_TRACT | 6 refills | Status: DC

## 2020-12-10 MED ORDER — LISINOPRIL 5 MG PO TABS: 5.0000 mg | ORAL_TABLET | Freq: Every day | ORAL | 1 refills | Status: DC

## 2020-12-10 MED ORDER — BISOPROLOL FUMARATE 5 MG PO TABS: 5.0000 mg | ORAL_TABLET | Freq: Every day | ORAL | 1 refills | Status: DC

## 2020-12-10 MED ORDER — FUROSEMIDE 40 MG PO TABS: 40.0000 mg | ORAL_TABLET | Freq: Every day | ORAL | 1 refills | Status: DC

## 2020-12-10 NOTE — Patient Instructions (Signed)
I have sent refills on your prescriptions to your pharmacy. We have added an inhaler called Advair for you to use twice a day to help with your breathing. We have referred you to the cardiologist.  They will call you with the appointment.  Please go to the radiology department at [facility] to have the chest x-ray done as ordered and discussed today.

## 2020-12-10 NOTE — Progress Notes (Addendum)
ts   Patient ID: [name] [name]., male    DOB: 25-Jan-1951  MRN: [id number]  CC: Reestablish care/chronic disease management  Subjective: [name] [name] is a 70 y.o. male who presents for re-est care and for chronic disease management.  Last seen here in 2017. His concerns today include:  Patient with history of chronic systolic CHF thought to be due to alcoholic dilated cardiomyopathy with initial EF in 2016 of 30 to 35%.;  Repeat in 2017 was 45 to 50%.  Never underwent cardiac catheterization.  If arrhythmia in the setting of EtOH encephalopathy, COPD, hypoxic respiratory failure on home O2, HL, prolonged QT, chronic EtOH abuse in remission at age 46, former smoker  Patient was last seen in 2017.  He has not had a PCP since that time.  Seen in the emergency room 2 months ago to get refills on his medications and was told he needs to reestablish care.   Chronic systolic CHF/chronic resp failure: Needing refill on furosemide, Zebeta and lisinopril. Denies PND orthopnea.  Sleeps on 2 pillows. Denies chest pains, dizziness, palpitations, headaches. Uses O2 mainly at night.  Feels he sleeps better with the oxygen.  He also takes his O2 with him when he is going outside the home.  He does not have it with him in the office today.  States he left it in the car. -Has atrial fibrillation listed on his chart.  However patient does not recall ever being on blood thinners.  I looked at cardiology note from 2017 and I do not see mention of atrial fibrillation either.  History of COPD but not on any inhalers.  Denies any chronic cough.  Reports feeling winded when walking outside in hot weather.  Weight loss: Lost about 25 pounds in the past 2 years.  Moving his bowels okay.  No blood in the stools.  No dysphagia.  No hematuria.  Reports that his appetite is good.  He has a male roommate but she is gone most of the time over the past 2 years helping to take care of her parents in [place].  She comes home  about twice a month and stays for the weekend.  He does his own cooking.  Noted to have some renal insufficiency on last chemistry with creatinine of 1.75 and GFR of 39.  He is not on any over-the-counter NSAIDs.  HM: Due for pneumonia vaccine, tetanus vaccine, hepatitis C screening and colon cancer screening.  Patient Active Problem List   Diagnosis Date Noted   Colon cancer screening 09/14/2015   Chronic systolic congestive heart failure (HCC) 09/14/2015   Acute systolic congestive heart failure (HCC) 08/12/2014   COPD bronchitis 08/12/2014   Alcohol abuse 08/12/2014   Cigarette nicotine dependence in remission 08/12/2014   Dyslipidemia 08/12/2014   COLD (chronic obstructive lung disease) (HCC)    Thrombocytopenia (HCC)    QT prolongation    Korsakoff syndrome (HCC)    Chronic alcohol abuse    Altered consciousness    Shortness of breath    SOB (shortness of breath)    Atrial fibrillation (HCC) 07/24/2014   CHF exacerbation (HCC) 07/22/2014   Acute respiratory failure with hypoxia (HCC)    Alcohol withdrawal (HCC)    CAP (community acquired pneumonia)    Acute respiratory failure (HCC)    Dyspnea    CHF (congestive heart failure) (HCC) 07/21/2014     Current Outpatient Medications on File Prior to Visit  Medication Sig Dispense Refill   atorvastatin (  LIPITOR) 40 MG tablet Take 1 tablet (40 mg total) by mouth daily. 30 tablet 2   bisoprolol (ZEBETA) 5 MG tablet Take 1 tablet (5 mg total) by mouth daily. 30 tablet 2   furosemide (LASIX) 40 MG tablet Take 1 tablet (40 mg total) by mouth daily. 30 tablet 2   lisinopril (ZESTRIL) 5 MG tablet Take 1 tablet (5 mg total) by mouth daily. 30 tablet 2   folic acid (FOLVITE) 1 MG tablet Take 1 tablet (1 mg total) by mouth daily. (Patient not taking: Reported on 12/10/2020) 90 tablet 3   nicotine (NICODERM CQ - DOSED IN MG/24 HOURS) 14 mg/24hr patch Place 1 patch (14 mg total) onto the skin daily. (Patient not taking: No sig reported) 28  patch 0   thiamine 100 MG tablet Take 1 tablet (100 mg total) by mouth daily. (Patient not taking: No sig reported) 90 tablet 3   No current facility-administered medications on file prior to visit.    Allergies  Allergen Reactions   Levaquin [Levofloxacin In D5w]     Generalized redness; received azithromycin, rocephin, and levaquin on the same day as reaction developed.    Social History   Socioeconomic History   Marital status: Single    Spouse name: Not on file   Number of children: Not on file   Years of education: Not on file   Highest education level: Not on file  Occupational History   Not on file  Tobacco Use   Smoking status: Former    Pack years: 0.00    Types: Cigarettes    Quit date: 07/22/2014    Years since quitting: 6.3   Smokeless tobacco: Never  Vaping Use   Vaping Use: Never used  Substance and Sexual Activity   Alcohol use: Yes    Alcohol/week: 175.0 standard drinks    Types: 175 Cans of beer per week   Drug use: No   Sexual activity: Not Currently  Other Topics Concern   Not on file  Social History Narrative   Not on file   Social Determinants of Health   Financial Resource Strain: Not on file  Food Insecurity: Not on file  Transportation Needs: Not on file  Physical Activity: Not on file  Stress: Not on file  Social Connections: Not on file  Intimate Partner Violence: Not on file    No family history on file.  Past Surgical History:  Procedure Laterality Date   LEFT HEART CATHETERIZATION WITH CORONARY ANGIOGRAM N/A 07/28/2014   Procedure: LEFT HEART CATHETERIZATION WITH CORONARY ANGIOGRAM;  Surgeon: [name] Noe, MD;  Location: [facility] CATH LAB;  Service: Cardiovascular;  Laterality: N/A;   LEG SURGERY     SPINE SURGERY      ROS: Review of Systems Negative except as stated above  PHYSICAL EXAM: BP 136/68   Pulse 71   Resp 16   Ht 5\' 11"  (1.803 m)   Wt 156 lb 6.4 oz (70.9 kg)   SpO2 95%   BMI 21.81 kg/m   Wt Readings from  Last 3 Encounters:  12/10/20 156 lb 6.4 oz (70.9 kg)  09/14/15 206 lb (93.4 kg)  08/12/14 177 lb (80.3 kg)    Physical Exam   General appearance -pleasant elderly male who appears chronically ill and has some temporal wasting.  He is in no acute distress Mental status - normal mood, behavior, speech, dress, motor activity, and thought processes Nose - normal and patent, no erythema, discharge or polyps Mouth -  mucous membranes moist, pharynx normal without lesions Neck - supple, no significant adenopathy.  No thyroid enlargement Chest -breath sounds decreased bilaterally right much greater than the left..  No wheezes heard.   Heart -irregular but not tachycardic.  Positive JVD Abdomen - soft, nontender, nondistended, no masses or organomegaly Extremities -trace lower extremity edema.   MSK: Some wasting of intrinsic muscles of the hands.  CMP Latest Ref Rng & Units 09/13/2018 09/14/2015 08/12/2014  Glucose 70 - 99 mg/dL 161(W129(H) 960(A100(H) 78  BUN 8 - 23 mg/dL 54(U36(H) 98(J27(H) 11  Creatinine 0.61 - 1.24 mg/dL 1.91(Y1.75(H) 7.821.11 9.560.57  Sodium 135 - 145 mmol/L 135 138 139  Potassium 3.5 - 5.1 mmol/L 4.3 4.6 4.2  Chloride 98 - 111 mmol/L 103 103 95(L)  CO2 22 - 32 mmol/L 22 29 35(H)  Calcium 8.9 - 10.3 mg/dL 9.4 9.8 9.1  Total Protein 6.1 - 8.1 g/dL - 8.0 6.8  Total Bilirubin 0.2 - 1.2 mg/dL - 0.6 0.9  Alkaline Phos 40 - 115 U/L - 82 88  AST 10 - 35 U/L - 25 28  ALT 9 - 46 U/L - 33 23   Lipid Panel     Component Value Date/Time   CHOL 105 (L) 09/14/2015 1014   TRIG 107 09/14/2015 1014   HDL 30 (L) 09/14/2015 1014   CHOLHDL 3.5 09/14/2015 1014   VLDL 21 09/14/2015 1014   LDLCALC 54 09/14/2015 1014    CBC    Component Value Date/Time   WBC 6.4 08/04/2014 0840   RBC 4.18 (L) 08/04/2014 0840   HGB 13.9 08/04/2014 0840   HCT 43.8 08/04/2014 0840   PLT 128 (L) 08/04/2014 0840   MCV 104.8 (H) 08/04/2014 0840   MCH 33.3 08/04/2014 0840   MCHC 31.7 08/04/2014 0840   RDW 15.3 08/04/2014 0840    LYMPHSABS 1.3 08/04/2014 0840   MONOABS 0.7 08/04/2014 0840   EOSABS 0.1 08/04/2014 0840   BASOSABS 0.0 08/04/2014 0840    ASSESSMENT AND PLAN: 1. Chronic systolic congestive heart failure (HCC) Low-salt diet encouraged. Refill medications including furosemide, Zebeta and Zestril.  Will refer to cardiology/heart failure clinic. - CBC - Comprehensive metabolic panel - Lipid panel - furosemide (LASIX) 40 MG tablet; Take 1 tablet (40 mg total) by mouth daily.  Dispense: 30 tablet; Refill: 1 - bisoprolol (ZEBETA) 5 MG tablet; Take 1 tablet (5 mg total) by mouth daily.  Dispense: 90 tablet; Refill: 1 - lisinopril (ZESTRIL) 5 MG tablet; Take 1 tablet (5 mg total) by mouth daily.  Dispense: 90 tablet; Refill: 1 - Ambulatory referral to Cardiology  2. Chronic obstructive pulmonary disease, unspecified COPD type (HCC) Given his difficulty breathing especially in warm weather recently, I have prescribed Advair for him to use daily.  Get chest x-ray given breath sounds significantly more decreased on the right than the left.  I have advised him to try to get this done as soon as possible. - fluticasone-salmeterol (ADVAIR DISKUS) 100-50 MCG/ACT AEPB; Inhale 1 puff into the lungs 2 (two) times daily.  Dispense: 1 each; Refill: 6 - DG Chest 2 View; Future  3.  Chronic hypoxemic respiratory failure, on O2  (HCC) On home O2.  Oxygen level today was on room air as he left his oxygen tank in the car.  4. Unintended weight loss We will try to get him up-to-date with age-appropriate cancer screenings including colon cancer screening and prostate cancer screening.  Check TSH.  Encouraged him to try to get in 3  meals a day. - TSH  5. Hyperlipidemia, unspecified hyperlipidemia type - atorvastatin (LIPITOR) 40 MG tablet; Take 1 tablet (40 mg total) by mouth daily.  Dispense: 90 tablet; Refill: 1  6. Renal insufficiency Recheck kidney function today with blood draw  7. Need for Tdap vaccination - Tdap  vaccine greater than or equal to 7yo IM  8. Need for vaccination against Streptococcus pneumoniae - Pneumococcal conjugate vaccine 20-valent (Prevnar 20)  9. Screening for colon cancer Discussed colon cancer screening methods.  Given his weight loss I recommended colonoscopy but patient declined.  He is agreeable to doing the Cologuard instead. - Cologuard  10. Prostate cancer screening Patient agreeable to prostate cancer screening with PSA level. - PSA  Note that our power went out outpatient was still in the facility which is the likely reason he did not get his blood test done that were ordered.  I will have my medical assistant call him and remind him to return to the lab on Friday or next week to have his blood test done. Patient was given the opportunity to ask questions.  Patient verbalized understanding of the plan and was able to repeat key elements of the plan.   No orders of the defined types were placed in this encounter.    Requested Prescriptions    No prescriptions requested or ordered in this encounter    No follow-ups on file.  Jonah Blue, MD, FACP

## 2020-12-11 ENCOUNTER — Telehealth: Payer: Self-pay | Admitting: Internal Medicine

## 2020-12-11 DIAGNOSIS — R634 Abnormal weight loss: Secondary | ICD-10-CM | POA: Insufficient documentation

## 2020-12-11 DIAGNOSIS — Z9981 Dependence on supplemental oxygen: Secondary | ICD-10-CM | POA: Diagnosis not present

## 2020-12-11 DIAGNOSIS — J9611 Chronic respiratory failure with hypoxia: Secondary | ICD-10-CM | POA: Diagnosis not present

## 2020-12-11 DIAGNOSIS — E785 Hyperlipidemia, unspecified: Secondary | ICD-10-CM | POA: Diagnosis not present

## 2020-12-11 DIAGNOSIS — N289 Disorder of kidney and ureter, unspecified: Secondary | ICD-10-CM | POA: Insufficient documentation

## 2020-12-11 DIAGNOSIS — I5022 Chronic systolic (congestive) heart failure: Secondary | ICD-10-CM | POA: Diagnosis not present

## 2020-12-11 DIAGNOSIS — Z23 Encounter for immunization: Secondary | ICD-10-CM | POA: Diagnosis not present

## 2020-12-11 DIAGNOSIS — J449 Chronic obstructive pulmonary disease, unspecified: Secondary | ICD-10-CM | POA: Diagnosis not present

## 2020-12-15 NOTE — Telephone Encounter (Signed)
Contacted pt and went over provider message pt states he will come by Monday or Tuesday of next week to get labs done

## 2021-02-17 ENCOUNTER — Ambulatory Visit: Payer: Medicare HMO | Admitting: Cardiology

## 2021-02-23 ENCOUNTER — Other Ambulatory Visit: Payer: Self-pay | Admitting: Internal Medicine

## 2021-02-23 DIAGNOSIS — I5022 Chronic systolic (congestive) heart failure: Secondary | ICD-10-CM

## 2021-02-23 NOTE — Telephone Encounter (Signed)
Pt calling in regards to this medication. He states that he is out of this medication. Please advise.

## 2021-02-24 NOTE — Telephone Encounter (Signed)
Requested medications are due for refill today.  Yes  Requested medications are on the active medications list.  yes  Last refill. 12/10/2020  Future visit scheduled.   no  Notes to clinic.  Per chart no PCP.

## 2021-03-03 ENCOUNTER — Telehealth: Payer: Self-pay | Admitting: Internal Medicine

## 2021-03-03 NOTE — Telephone Encounter (Signed)
Copied from CRM [id number]. Topic: General - Other >> Mar 02, 2021  2:53 PM Jaquita Rector A wrote: Reason for CRM: Patient called in to inform Dr Laural Benes that he need to have a 90 day supply on his furosemide (LASIX) 40 MG tablet. Stated that it was only written for a 30 day supply. Please advise

## 2021-03-04 NOTE — Telephone Encounter (Signed)
According to Dr. Henriette Combs last note, pt needs to return to labs. He'll need to get these done before we can refill this. The orders are already placed so he can be instructed to return to the lab here and complete them.

## 2021-04-18 ENCOUNTER — Emergency Department (HOSPITAL_COMMUNITY): Payer: Medicare HMO

## 2021-04-18 ENCOUNTER — Encounter (HOSPITAL_COMMUNITY): Payer: Self-pay | Admitting: Family Medicine

## 2021-04-18 ENCOUNTER — Other Ambulatory Visit: Payer: Self-pay

## 2021-04-18 ENCOUNTER — Emergency Department (HOSPITAL_BASED_OUTPATIENT_CLINIC_OR_DEPARTMENT_OTHER)
Admit: 2021-04-18 | Discharge: 2021-04-18 | Disposition: A | Discharge status: Home / Self Care | Payer: Medicare HMO | Attending: Emergency Medicine | Admitting: Emergency Medicine

## 2021-04-18 ENCOUNTER — Inpatient Hospital Stay (HOSPITAL_COMMUNITY)
Admission: EM | Admit: 2021-04-18 | Discharge: 2021-04-26 | DRG: 291 | Disposition: A | Discharge status: Hospice | Payer: Medicare HMO | Attending: Student | Admitting: Student

## 2021-04-18 DIAGNOSIS — R0602 Shortness of breath: Secondary | ICD-10-CM | POA: Diagnosis not present

## 2021-04-18 DIAGNOSIS — K3189 Other diseases of stomach and duodenum: Secondary | ICD-10-CM | POA: Diagnosis not present

## 2021-04-18 DIAGNOSIS — Z515 Encounter for palliative care: Secondary | ICD-10-CM

## 2021-04-18 DIAGNOSIS — E8809 Other disorders of plasma-protein metabolism, not elsewhere classified: Secondary | ICD-10-CM | POA: Diagnosis present

## 2021-04-18 DIAGNOSIS — E1122 Type 2 diabetes mellitus with diabetic chronic kidney disease: Secondary | ICD-10-CM | POA: Diagnosis present

## 2021-04-18 DIAGNOSIS — R0603 Acute respiratory distress: Secondary | ICD-10-CM

## 2021-04-18 DIAGNOSIS — R188 Other ascites: Secondary | ICD-10-CM | POA: Diagnosis not present

## 2021-04-18 DIAGNOSIS — D696 Thrombocytopenia, unspecified: Secondary | ICD-10-CM | POA: Diagnosis present

## 2021-04-18 DIAGNOSIS — N183 Chronic kidney disease, stage 3 unspecified: Secondary | ICD-10-CM | POA: Diagnosis not present

## 2021-04-18 DIAGNOSIS — I2510 Atherosclerotic heart disease of native coronary artery without angina pectoris: Secondary | ICD-10-CM | POA: Diagnosis not present

## 2021-04-18 DIAGNOSIS — K7031 Alcoholic cirrhosis of liver with ascites: Secondary | ICD-10-CM | POA: Diagnosis present

## 2021-04-18 DIAGNOSIS — Z681 Body mass index (BMI) 19 or less, adult: Secondary | ICD-10-CM

## 2021-04-18 DIAGNOSIS — Z87891 Personal history of nicotine dependence: Secondary | ICD-10-CM

## 2021-04-18 DIAGNOSIS — F1011 Alcohol abuse, in remission: Secondary | ICD-10-CM | POA: Diagnosis present

## 2021-04-18 DIAGNOSIS — R634 Abnormal weight loss: Secondary | ICD-10-CM | POA: Diagnosis present

## 2021-04-18 DIAGNOSIS — N179 Acute kidney failure, unspecified: Secondary | ICD-10-CM

## 2021-04-18 DIAGNOSIS — K921 Melena: Secondary | ICD-10-CM | POA: Diagnosis not present

## 2021-04-18 DIAGNOSIS — J449 Chronic obstructive pulmonary disease, unspecified: Secondary | ICD-10-CM | POA: Diagnosis present

## 2021-04-18 DIAGNOSIS — Z66 Do not resuscitate: Secondary | ICD-10-CM | POA: Diagnosis not present

## 2021-04-18 DIAGNOSIS — D649 Anemia, unspecified: Secondary | ICD-10-CM | POA: Diagnosis not present

## 2021-04-18 DIAGNOSIS — I959 Hypotension, unspecified: Secondary | ICD-10-CM | POA: Diagnosis present

## 2021-04-18 DIAGNOSIS — Z20822 Contact with and (suspected) exposure to covid-19: Secondary | ICD-10-CM | POA: Diagnosis present

## 2021-04-18 DIAGNOSIS — I480 Paroxysmal atrial fibrillation: Secondary | ICD-10-CM

## 2021-04-18 DIAGNOSIS — Z79899 Other long term (current) drug therapy: Secondary | ICD-10-CM

## 2021-04-18 DIAGNOSIS — R69 Illness, unspecified: Secondary | ICD-10-CM | POA: Diagnosis not present

## 2021-04-18 DIAGNOSIS — D689 Coagulation defect, unspecified: Secondary | ICD-10-CM | POA: Diagnosis present

## 2021-04-18 DIAGNOSIS — K219 Gastro-esophageal reflux disease without esophagitis: Secondary | ICD-10-CM | POA: Diagnosis present

## 2021-04-18 DIAGNOSIS — J9811 Atelectasis: Secondary | ICD-10-CM | POA: Diagnosis not present

## 2021-04-18 DIAGNOSIS — J432 Centrilobular emphysema: Secondary | ICD-10-CM | POA: Diagnosis not present

## 2021-04-18 DIAGNOSIS — N1832 Chronic kidney disease, stage 3b: Secondary | ICD-10-CM | POA: Diagnosis present

## 2021-04-18 DIAGNOSIS — R0902 Hypoxemia: Secondary | ICD-10-CM

## 2021-04-18 DIAGNOSIS — I10 Essential (primary) hypertension: Secondary | ICD-10-CM | POA: Diagnosis not present

## 2021-04-18 DIAGNOSIS — R9431 Abnormal electrocardiogram [ECG] [EKG]: Secondary | ICD-10-CM | POA: Diagnosis present

## 2021-04-18 DIAGNOSIS — R64 Cachexia: Secondary | ICD-10-CM | POA: Diagnosis present

## 2021-04-18 DIAGNOSIS — Z881 Allergy status to other antibiotic agents status: Secondary | ICD-10-CM

## 2021-04-18 DIAGNOSIS — M7989 Other specified soft tissue disorders: Secondary | ICD-10-CM

## 2021-04-18 DIAGNOSIS — I81 Portal vein thrombosis: Secondary | ICD-10-CM | POA: Diagnosis not present

## 2021-04-18 DIAGNOSIS — I130 Hypertensive heart and chronic kidney disease with heart failure and stage 1 through stage 4 chronic kidney disease, or unspecified chronic kidney disease: Principal | ICD-10-CM | POA: Diagnosis present

## 2021-04-18 DIAGNOSIS — D62 Acute posthemorrhagic anemia: Secondary | ICD-10-CM | POA: Diagnosis present

## 2021-04-18 DIAGNOSIS — K7469 Other cirrhosis of liver: Secondary | ICD-10-CM | POA: Diagnosis not present

## 2021-04-18 DIAGNOSIS — R197 Diarrhea, unspecified: Secondary | ICD-10-CM | POA: Diagnosis not present

## 2021-04-18 DIAGNOSIS — J90 Pleural effusion, not elsewhere classified: Secondary | ICD-10-CM | POA: Diagnosis not present

## 2021-04-18 DIAGNOSIS — J398 Other specified diseases of upper respiratory tract: Secondary | ICD-10-CM | POA: Diagnosis not present

## 2021-04-18 DIAGNOSIS — J9621 Acute and chronic respiratory failure with hypoxia: Secondary | ICD-10-CM | POA: Diagnosis present

## 2021-04-18 DIAGNOSIS — J918 Pleural effusion in other conditions classified elsewhere: Secondary | ICD-10-CM | POA: Diagnosis present

## 2021-04-18 DIAGNOSIS — R54 Age-related physical debility: Secondary | ICD-10-CM | POA: Diagnosis present

## 2021-04-18 DIAGNOSIS — I517 Cardiomegaly: Secondary | ICD-10-CM | POA: Diagnosis not present

## 2021-04-18 DIAGNOSIS — E43 Unspecified severe protein-calorie malnutrition: Secondary | ICD-10-CM | POA: Insufficient documentation

## 2021-04-18 DIAGNOSIS — R932 Abnormal findings on diagnostic imaging of liver and biliary tract: Secondary | ICD-10-CM | POA: Diagnosis not present

## 2021-04-18 DIAGNOSIS — I700 Atherosclerosis of aorta: Secondary | ICD-10-CM | POA: Diagnosis not present

## 2021-04-18 DIAGNOSIS — R0609 Other forms of dyspnea: Secondary | ICD-10-CM | POA: Diagnosis not present

## 2021-04-18 DIAGNOSIS — I5043 Acute on chronic combined systolic (congestive) and diastolic (congestive) heart failure: Secondary | ICD-10-CM | POA: Diagnosis present

## 2021-04-18 DIAGNOSIS — Z7189 Other specified counseling: Secondary | ICD-10-CM | POA: Diagnosis not present

## 2021-04-18 DIAGNOSIS — R531 Weakness: Secondary | ICD-10-CM | POA: Diagnosis not present

## 2021-04-18 DIAGNOSIS — I350 Nonrheumatic aortic (valve) stenosis: Secondary | ICD-10-CM | POA: Diagnosis not present

## 2021-04-18 DIAGNOSIS — K767 Hepatorenal syndrome: Secondary | ICD-10-CM | POA: Diagnosis not present

## 2021-04-18 DIAGNOSIS — Z8673 Personal history of transient ischemic attack (TIA), and cerebral infarction without residual deficits: Secondary | ICD-10-CM

## 2021-04-18 DIAGNOSIS — J948 Other specified pleural conditions: Secondary | ICD-10-CM | POA: Diagnosis not present

## 2021-04-18 DIAGNOSIS — K4090 Unilateral inguinal hernia, without obstruction or gangrene, not specified as recurrent: Secondary | ICD-10-CM | POA: Diagnosis not present

## 2021-04-18 DIAGNOSIS — K7689 Other specified diseases of liver: Secondary | ICD-10-CM | POA: Diagnosis not present

## 2021-04-18 LAB — COMPREHENSIVE METABOLIC PANEL
ALT: 28 U/L (ref 0–44)
AST: 50 U/L — ABNORMAL HIGH (ref 15–41)
Albumin: 2.8 g/dL — ABNORMAL LOW (ref 3.5–5.0)
Alkaline Phosphatase: 171 U/L — ABNORMAL HIGH (ref 38–126)
Anion gap: 12 (ref 5–15)
BUN: 44 mg/dL — ABNORMAL HIGH (ref 8–23)
CO2: 20 mmol/L — ABNORMAL LOW (ref 22–32)
Calcium: 9.2 mg/dL (ref 8.9–10.3)
Chloride: 104 mmol/L (ref 98–111)
Creatinine, Ser: 1.86 mg/dL — ABNORMAL HIGH (ref 0.61–1.24)
GFR, Estimated: 38 mL/min — ABNORMAL LOW (ref >60)
Glucose, Bld: 83 mg/dL (ref 70–99)
Potassium: 4.5 mmol/L (ref 3.5–5.1)
Sodium: 136 mmol/L (ref 135–145)
Total Bilirubin: 1.1 mg/dL (ref 0.3–1.2)
Total Protein: 7.2 g/dL (ref 6.5–8.1)

## 2021-04-18 LAB — RESP PANEL BY RT-PCR (FLU A&B, COVID) ARPGX2
Influenza A by PCR: NEGATIVE
Influenza B by PCR: NEGATIVE
SARS Coronavirus 2 by RT PCR: NEGATIVE

## 2021-04-18 LAB — CBC WITH DIFFERENTIAL/PLATELET
Abs Immature Granulocytes: 0.03 10*3/uL (ref 0.00–0.07)
Basophils Absolute: 0 10*3/uL (ref 0.0–0.1)
Basophils Relative: 0 %
Eosinophils Absolute: 0 10*3/uL (ref 0.0–0.5)
Eosinophils Relative: 0 %
HCT: 41.5 % (ref 39.0–52.0)
Hemoglobin: 13.4 g/dL (ref 13.0–17.0)
Immature Granulocytes: 0 %
Lymphocytes Relative: 7 %
Lymphs Abs: 0.6 10*3/uL — ABNORMAL LOW (ref 0.7–4.0)
MCH: 31.5 pg (ref 26.0–34.0)
MCHC: 32.3 g/dL (ref 30.0–36.0)
MCV: 97.4 fL (ref 80.0–100.0)
Monocytes Absolute: 0.6 10*3/uL (ref 0.1–1.0)
Monocytes Relative: 7 %
Neutro Abs: 8 10*3/uL — ABNORMAL HIGH (ref 1.7–7.7)
Neutrophils Relative %: 86 %
Platelets: 171 10*3/uL (ref 150–400)
RBC: 4.26 MIL/uL (ref 4.22–5.81)
RDW: 16.1 % — ABNORMAL HIGH (ref 11.5–15.5)
WBC: 9.2 10*3/uL (ref 4.0–10.5)
nRBC: 0 % (ref 0.0–0.2)

## 2021-04-18 LAB — AMMONIA: Ammonia: 26 umol/L (ref 9–35)

## 2021-04-18 LAB — PROTIME-INR
INR: 1.1 (ref 0.8–1.2)
Prothrombin Time: 13.8 seconds (ref 11.4–15.2)

## 2021-04-18 LAB — TYPE AND SCREEN
ABO/RH(D): A POS
Antibody Screen: NEGATIVE

## 2021-04-18 LAB — POC OCCULT BLOOD, ED: Fecal Occult Bld: NEGATIVE

## 2021-04-18 LAB — MAGNESIUM: Magnesium: 2.1 mg/dL (ref 1.7–2.4)

## 2021-04-18 MED ORDER — HEPARIN (PORCINE) 25000 UT/250ML-% IV SOLN
1050.0000 [IU]/h | INTRAVENOUS | Status: DC
  Administered 2021-04-18 – 2021-04-19 (×2): 1100 [IU]/h via INTRAVENOUS
  Administered 2021-04-20 – 2021-04-21 (×2): 1050 [IU]/h via INTRAVENOUS
  Filled 2021-04-18 (×4): qty 250

## 2021-04-18 MED ORDER — SODIUM CHLORIDE 0.9% FLUSH: 3.0000 mL | INTRAVENOUS | Status: DC | PRN

## 2021-04-18 MED ORDER — SODIUM CHLORIDE 0.9% FLUSH
3.0000 mL | Freq: Two times a day (BID) | INTRAVENOUS | Status: DC
  Administered 2021-04-20 – 2021-04-26 (×13): 3 mL via INTRAVENOUS

## 2021-04-18 MED ORDER — FUROSEMIDE 10 MG/ML IJ SOLN
40.0000 mg | Freq: Two times a day (BID) | INTRAMUSCULAR | Status: AC
  Administered 2021-04-18 – 2021-04-19 (×2): 40 mg via INTRAVENOUS
  Filled 2021-04-18 (×2): qty 4

## 2021-04-18 MED ORDER — BISOPROLOL FUMARATE 5 MG PO TABS
5.0000 mg | ORAL_TABLET | Freq: Every day | ORAL | Status: DC
  Administered 2021-04-18 – 2021-04-19 (×2): 5 mg via ORAL
  Filled 2021-04-18 (×2): qty 1

## 2021-04-18 MED ORDER — SODIUM CHLORIDE 0.9 % IV SOLN: INTRAVENOUS | Status: DC

## 2021-04-18 MED ORDER — SODIUM CHLORIDE 0.9 % IV BOLUS
1000.0000 mL | Freq: Once | INTRAVENOUS | Status: AC
  Administered 2021-04-18: 1000 mL via INTRAVENOUS

## 2021-04-18 MED ORDER — LISINOPRIL 10 MG PO TABS
5.0000 mg | ORAL_TABLET | Freq: Every day | ORAL | Status: DC
  Administered 2021-04-18: 5 mg via ORAL
  Filled 2021-04-18: qty 1

## 2021-04-18 MED ORDER — SODIUM CHLORIDE 0.9 % IV SOLN: 250.0000 mL | INTRAVENOUS | Status: DC | PRN

## 2021-04-18 MED ORDER — IPRATROPIUM-ALBUTEROL 0.5-2.5 (3) MG/3ML IN SOLN
3.0000 mL | Freq: Four times a day (QID) | RESPIRATORY_TRACT | Status: DC | PRN
  Administered 2021-04-19: 3 mL via RESPIRATORY_TRACT
  Filled 2021-04-18: qty 3

## 2021-04-18 MED ORDER — TRAZODONE HCL 50 MG PO TABS
50.0000 mg | ORAL_TABLET | Freq: Every day | ORAL | Status: DC
  Administered 2021-04-18 – 2021-04-25 (×7): 50 mg via ORAL
  Filled 2021-04-18 (×7): qty 1

## 2021-04-18 MED ORDER — HYDROMORPHONE HCL 1 MG/ML IJ SOLN
0.5000 mg | INTRAMUSCULAR | Status: AC | PRN
  Administered 2021-04-19 (×4): 0.5 mg via INTRAVENOUS
  Filled 2021-04-18 (×4): qty 1

## 2021-04-18 MED ORDER — ATORVASTATIN CALCIUM 40 MG PO TABS
40.0000 mg | ORAL_TABLET | Freq: Every day | ORAL | Status: DC
  Administered 2021-04-18 – 2021-04-22 (×5): 40 mg via ORAL
  Filled 2021-04-18 (×5): qty 1

## 2021-04-18 MED ORDER — FLUTICASONE FUROATE-VILANTEROL 100-25 MCG/ACT IN AEPB
1.0000 | INHALATION_SPRAY | Freq: Every day | RESPIRATORY_TRACT | Status: DC
  Administered 2021-04-19 – 2021-04-26 (×6): 1 via RESPIRATORY_TRACT
  Filled 2021-04-18: qty 28

## 2021-04-18 NOTE — ED Provider Notes (Addendum)
[facility] EMERGENCY DEPARTMENT Provider Note   CSN: [id number] Arrival date & time: 04/18/21  1401     History Chief Complaint  Patient presents with   Shortness of Breath    [name] [name]. is a 70 y.o. male.  Pt presents to the ED today with several issues.  Pt said he's had multiple episodes of diarrhea.  He is extremely weak.  He also has a cough with sob.  He has noticed that his abdomen is swollen as well.  Pt said he normally can take care of himself, but has been unable to get out of bed.      Past Medical History:  Diagnosis Date   Anxiety    CHF (congestive heart failure) (HCC)    GERD (gastroesophageal reflux disease)    Heavy smoker    Shortness of breath dyspnea    Stroke (HCC)    a. 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   Hyperlipidemia 08/12/2014   COLD (chronic obstructive lung disease) (HCC)    Thrombocytopenia (HCC)    QT prolongation    Korsakoff syndrome (HCC)    Chronic alcohol abuse    Altered consciousness    Shortness of breath    SOB (shortness of breath)    Atrial fibrillation (HCC) 07/24/2014   CHF exacerbation (HCC) 07/22/2014   Acute respiratory failure with hypoxia (HCC)    Alcohol withdrawal (HCC)    CAP (community acquired pneumonia)    Acute respiratory failure (HCC)    Dyspnea    CHF (congestive heart failure) (HCC) 07/21/2014    Past Surgical History:  Procedure Laterality Date   LEFT HEART CATHETERIZATION WITH CORONARY ANGIOGRAM N/A 07/28/2014   Procedure: LEFT HEART  CATHETERIZATION WITH CORONARY ANGIOGRAM;  Surgeon: Lesleigh Noe, MD;  Location: [facility] CATH LAB;  Service: Cardiovascular;  Laterality: N/A;   LEG SURGERY     SPINE SURGERY         No family history on file.  Social History   Tobacco Use   Smoking status: Former    Types: Cigarettes    Quit date: 07/22/2014    Years since quitting: 6.7   Smokeless tobacco: Never  Vaping Use   Vaping Use: Never used  Substance Use Topics   Alcohol use: Yes    Alcohol/week: 175.0 standard drinks    Types: 175 Cans of beer per week   Drug use: No    Home Medications Prior to Admission medications   Medication Sig Start Date End Date Taking? Authorizing Provider  atorvastatin (LIPITOR) 40 MG tablet Take 1 tablet (40 mg total) by mouth daily. 12/10/20   Marcine Matar, MD  bisoprolol (ZEBETA) 5 MG tablet Take 1 tablet (5 mg total) by mouth daily. 12/10/20   Marcine Matar, MD  fluticasone-salmeterol (ADVAIR DISKUS) 100-50 MCG/ACT AEPB Inhale 1 puff into the lungs 2 (two) times daily. 12/10/20   Marcine Matar, MD  furosemide (LASIX) 40 MG tablet Take 1 tablet by mouth once daily 02/25/21   Marcine Matar, MD  lisinopril (ZESTRIL) 5  MG tablet Take 1 tablet (5 mg total) by mouth daily. 12/10/20   Marcine Matar, MD    Allergies    Levaquin [levofloxacin in d5w]  Review of Systems   Review of Systems  Respiratory:  Positive for cough and shortness of breath.   Gastrointestinal:  Positive for diarrhea.  Neurological:  Positive for weakness.  All other systems reviewed and are negative.  Physical Exam Updated Vital Signs BP 105/75   Pulse 82   Temp 98.9 F (37.2 C)   Resp 18   SpO2 98%   Physical Exam Vitals and nursing note reviewed.  Constitutional:      Appearance: He is underweight. He is ill-appearing.  HENT:     Head: Normocephalic and atraumatic.     Mouth/Throat:     Mouth: Mucous membranes are dry.  Eyes:     Extraocular Movements: Extraocular movements  intact.     Pupils: Pupils are equal, round, and reactive to light.  Cardiovascular:     Rate and Rhythm: Normal rate and regular rhythm.  Pulmonary:     Effort: Tachypnea present.     Breath sounds: Rhonchi present.  Abdominal:     General: Bowel sounds are normal.     Palpations: Abdomen is soft.     Tenderness: There is generalized abdominal tenderness.     Comments: Large ascites  Musculoskeletal:        General: Normal range of motion.     Cervical back: Normal range of motion and neck supple.     Right lower leg: 1+ Pitting Edema present.     Left lower leg: 1+ Pitting Edema present.     Comments: Right leg more swollen than left  Skin:    General: Skin is warm.     Capillary Refill: Capillary refill takes less than 2 seconds.  Neurological:     Mental Status: He is alert and oriented to person, place, and time.     Comments: Diffusely weak  Psychiatric:        Mood and Affect: Mood normal.        Behavior: Behavior normal.    ED Results / Procedures / Treatments   Labs (all labs ordered are listed, but only abnormal results are displayed) Labs Reviewed  COMPREHENSIVE METABOLIC PANEL - Abnormal; Notable for the following components:      Result Value   CO2 20 (*)    BUN 44 (*)    Creatinine, Ser 1.86 (*)    Albumin 2.8 (*)    AST 50 (*)    Alkaline Phosphatase 171 (*)    GFR, Estimated 38 (*)    All other components within normal limits  CBC WITH DIFFERENTIAL/PLATELET - Abnormal; Notable for the following components:   RDW 16.1 (*)    Neutro Abs 8.0 (*)    Lymphs Abs 0.6 (*)    All other components within normal limits  RESP PANEL BY RT-PCR (FLU A&B, COVID) [id number]  GASTROINTESTINAL PANEL BY PCR, STOOL (REPLACES STOOL CULTURE)  C DIFFICILE QUICK SCREEN W PCR REFLEX    PROTIME-INR  AMMONIA  MAGNESIUM  POC OCCULT BLOOD, ED  TYPE AND SCREEN  ABO/RH    EKG None  Radiology DG Chest Port 1 View  Result Date: 04/18/2021 CLINICAL DATA:  Short of breath  EXAM: PORTABLE CHEST 1 VIEW COMPARISON:  Chest x-ray 07/30/2014 FINDINGS: Persistently enlarged cardiac silhouette. The heart and mediastinal contours are unchanged. Aortic calcification. Right mid lower lung zone airspace opacity.  No pulmonary edema. Interval increase in size of an at least small right pleural effusion. No pneumothorax. No acute osseous abnormality. IMPRESSION: 1. Interval increase in size of an at least small right pleural effusion. 2. Right mid lower lung zone airspace opacity that could represent atelectasis versus infection/inflammation. Followup PA and lateral chest X-ray is recommended in 3-4 weeks following therapy to ensure resolution and exclude underlying malignancy. Electronically Signed   By: Tish Frederickson M.D.   On: 04/18/2021 16:08   VAS [place] LOWER EXTREMITY VENOUS (DVT) (7a-7p)  Result Date: 04/18/2021  Lower Venous DVT Study Patient Name:  Jaquil Todt.  Date of Exam:   04/18/2021 Medical Rec #: [id number]         Accession #:    [id number] Date of Birth: 05/02/1951         Patient Gender: M Patient Age:   75 years Exam Location:  [facility] Procedure:      VAS [place] LOWER EXTREMITY VENOUS (DVT) Referring Phys: Toby Ayad --------------------------------------------------------------------------------  Indications: Swelling.  Risk Factors: None identified. Limitations: Poor ultrasound/tissue interface. Comparison Study: No prior studies. Performing Technologist: Chanda Busing RVT  Examination Guidelines: A complete evaluation includes B-mode imaging, spectral Doppler, color Doppler, and power Doppler as needed of all accessible portions of each vessel. Bilateral testing is considered an integral part of a complete examination. Limited examinations for reoccurring indications may be performed as noted. The reflux portion of the exam is performed with the patient in reverse Trendelenburg.  +---------+---------------+---------+-----------+----------+--------------+  RIGHT    CompressibilityPhasicitySpontaneityPropertiesThrombus Aging +---------+---------------+---------+-----------+----------+--------------+ CFV      Full           Yes      Yes                                 +---------+---------------+---------+-----------+----------+--------------+ SFJ      Full                                                        +---------+---------------+---------+-----------+----------+--------------+ FV Prox  Full                                                        +---------+---------------+---------+-----------+----------+--------------+ FV Mid   Full                                                        +---------+---------------+---------+-----------+----------+--------------+ FV Distal               Yes      Yes                                 +---------+---------------+---------+-----------+----------+--------------+ PFV      Full                                                        +---------+---------------+---------+-----------+----------+--------------+  POP      Full           Yes      Yes                                 +---------+---------------+---------+-----------+----------+--------------+ PTV      Full                                                        +---------+---------------+---------+-----------+----------+--------------+ PERO     Full                                                        +---------+---------------+---------+-----------+----------+--------------+   +----+---------------+---------+-----------+----------+--------------+ LEFTCompressibilityPhasicitySpontaneityPropertiesThrombus Aging +----+---------------+---------+-----------+----------+--------------+ CFV Full           Yes      Yes                                 +----+---------------+---------+-----------+----------+--------------+     Summary: RIGHT: - There is no evidence of deep vein thrombosis in  the lower extremity. However, portions of this examination were limited- see technologist comments above.  - No cystic structure found in the popliteal fossa.  LEFT: - No evidence of common femoral vein obstruction.  *See table(s) above for measurements and observations. Electronically signed by Waverly Ferrari MD on 04/18/2021 at 5:44:35 PM.    Final    CT CHEST ABDOMEN PELVIS WO CONTRAST  Result Date: 04/18/2021 CLINICAL DATA:  Pleural effusion.  Shortness of breath. EXAM: CT CHEST, ABDOMEN AND PELVIS WITHOUT CONTRAST TECHNIQUE: Multidetector CT imaging of the chest, abdomen and pelvis was performed following the standard protocol without IV contrast. COMPARISON:  Chest x-ray 04/18/2021 FINDINGS: CT CHEST FINDINGS Cardiovascular: Normal heart size. No significant pericardial effusion. The thoracic aorta is normal in caliber. Severe atherosclerotic plaque of the thoracic aorta. Three-vessel coronary artery calcifications. Mediastinum/Nodes: No gross hilar adenopathy, noting limited sensitivity for the detection of hilar adenopathy on this noncontrast study. No enlarged mediastinal or axillary lymph nodes. Thyroid gland, trachea, and esophagus demonstrate no significant findings. Lungs/Pleura: Mild-to-moderate centrilobular emphysematous changes. Collapse of the right lower lobe. No focal consolidation. No pulmonary nodule. No pulmonary mass. No pneumothorax. Moderate small volume right pleural effusion. No left pleural effusion. Musculoskeletal: No chest wall abnormality. No suspicious lytic or blastic osseous lesions. No acute displaced fracture. Multilevel degenerative changes of the spine. CT ABDOMEN PELVIS FINDINGS Hepatobiliary: Nodular hepatic contour. Heterogeneous appearance of the liver with likely underlying mass lesions. No gallstones, gallbladder wall thickening, or pericholecystic fluid. No biliary dilatation. Pancreas: No focal lesion. Normal pancreatic contour. No surrounding inflammatory  changes. No main pancreatic ductal dilatation. Spleen: Normal in size without focal abnormality. Adrenals/Urinary Tract: No adrenal nodule bilaterally. No nephrolithiasis and no hydronephrosis. No definite contour-deforming renal mass. No ureterolithiasis or hydroureter. The urinary bladder is unremarkable. Stomach/Bowel: Stomach is within normal limits. No evidence of definite bowel wall thickening or dilatation. No pneumatosis identified. Vascular/Lymphatic: No abdominal aorta or iliac aneurysm. Severe atherosclerotic plaque of the aorta and its branches. No gross  abdominal, pelvic, or inguinal lymphadenopathy. Reproductive: The prostate is unremarkable. Other: Large volume ascites. Musculoskeletal: Small volume right inguinal hernia containing ascites. No suspicious lytic or blastic osseous lesions. No acute displaced fracture. Multilevel degenerative changes of the spine. IMPRESSION: 1. Moderate to large volume right pleural effusion. Associated collapse of the right lower lobe. 2. Question cirrhotic morphology of the liver with heterogeneous appearance of the parenchyma suggestive of underlying mass lesions. Concern for malignancy. Recommend nonemergent MRI liver protocol. When the patient is clinically stable and able to follow directions and hold their breath (preferably as an outpatient) further evaluation with dedicated abdominal MRI should be considered. 3. Large volume ascites. 4. Markedly limited evaluation on this noncontrast study. 5. Aortic Atherosclerosis (ICD10-I70.0) and Emphysema (ICD10-J43.9). Electronically Signed   By: Tish Frederickson M.D.   On: 04/18/2021 19:45    Procedures Procedures   Medications Ordered in ED Medications  sodium chloride 0.9 % bolus 1,000 mL (0 mLs Intravenous Stopped 04/18/21 2013)    And  0.9 %  sodium chloride infusion ( Intravenous New Bag/Given 04/18/21 1806)    ED Course  I have reviewed the triage vital signs and the nursing notes.  Pertinent labs &  imaging results that were available during my care of the patient were reviewed by me and considered in my medical decision making (see chart for details).    MDM Rules/Calculators/A&P                           Pt's O2 sat dropped to the mid-80s when we laid him down flat for just a brief period.  He felt very sob and said he could not breathe.  We sat him up and O2 sat stayed around 88%.  He was put on 2L oxygen via [place] and O2 sats are in the low-90s.  Pt will need thoracentesis and paracentesis, but I don't think he needs this tonight.  He is comfortable on 2L.  Pt's bp has improved with fluids.  Pt has a mild aki.  This is likely from dehydration as he has not been eating or drinking.  Pt reports dark stool, but it is guaiac negative.  Hgb is nl.  Pt reports diarrhea, but has not had any here.  Stool studies have been ordered.  Covid/flu negative.  Pt dw Dr. Rachael Darby (triad) for admission.  CRITICAL CARE Performed by: Jacalyn Lefevre   Total critical care time: 30 minutes  Critical care time was exclusive of separately billable procedures and treating other patients.  Critical care was necessary to treat or prevent imminent or life-threatening deterioration.  Critical care was time spent personally by me on the following activities: development of treatment plan with patient and/or surrogate as well as nursing, discussions with consultants, evaluation of patient's response to treatment, examination of patient, obtaining history from patient or surrogate, ordering and performing treatments and interventions, ordering and review of laboratory studies, ordering and review of radiographic studies, pulse oximetry and re-evaluation of patient's condition.   Final Clinical Impression(s) / ED Diagnoses Final diagnoses:  SOB (shortness of breath)  Pleural effusion on right  Hypoxia  Other ascites  AKI (acute kidney injury) (HCC)  Generalized weakness    Rx / DC Orders ED Discharge  Orders     None        Jacalyn Lefevre, MD 04/18/21 2032    Jacalyn Lefevre, MD 04/18/21 2033

## 2021-04-18 NOTE — ED Triage Notes (Signed)
Pt c/o SHOB, states he is out of home O2, unsure how much he normally wears. Advises he has had diarrhea "for awhile." Hx CHF, unsure of diuretic name, last took yesterday.   O2 95% in triage, NAD noted

## 2021-04-18 NOTE — ED Triage Notes (Addendum)
Pt unable to advise this RN how long episodes of diarrhea occurring, BP 83/60 in triage. Charge RN advised, pt roomed

## 2021-04-18 NOTE — Progress Notes (Signed)
ANTICOAGULATION CONSULT NOTE - Initial Consult  Pharmacy Consult for Heparin Indication: atrial fibrillation  Allergies  Allergen Reactions   Levaquin [Levofloxacin In D5w]     Generalized redness; received azithromycin, rocephin, and levaquin on the same day as reaction developed.    Patient Measurements:   Heparin Dosing Weight: 71 kg  Vital Signs: Temp: 98.9 F (37.2 C) (10/23 1528) BP: 103/65 (10/23 2100) Pulse Rate: 80 (10/23 2100)  Labs: Recent Labs    04/18/21 1558  HGB 13.4  HCT 41.5  PLT 171  LABPROT 13.8  INR 1.1  CREATININE 1.86*    CrCl cannot be calculated (Unknown ideal weight.).   Medical History: Past Medical History:  Diagnosis Date   Anxiety    CHF (congestive heart failure) (HCC)    GERD (gastroesophageal reflux disease)    Heavy smoker    Shortness of breath dyspnea    Stroke (HCC)    a. Noted on CT 07/29/14    Medications:  (Not in a hospital admission)  Scheduled:   atorvastatin  40 mg Oral Daily   bisoprolol  5 mg Oral Daily   [START ON 04/19/2021] fluticasone furoate-vilanterol  1 puff Inhalation Daily   furosemide  40 mg Intravenous BID   lisinopril  5 mg Oral Daily   sodium chloride flush  3 mL Intravenous Q12H   Infusions:   sodium chloride 125 mL/hr at 04/18/21 1806   sodium chloride     PRN: sodium chloride, HYDROmorphone (DILAUDID) injection, ipratropium-albuterol, sodium chloride flush  Assessment: 70 yom with a history of anxiety, HF, GERD, smoker, SOB, stroke, HLD. Patient is presenting with SOB. Heparin per pharmacy consult placed for atrial fibrillation.  Patient is not on anticoagulation prior to arrival.  Hgb 13.4; plt 171  Goal of Therapy:  Heparin level 0.3-0.7 units/ml Monitor platelets by anticoagulation protocol: Yes   Plan:  No initial bolus Start heparin infusion at 1100 units/hr Check anti-Xa level in 8 hours and daily while on heparin Continue to monitor H&H and platelets  Delmar Landau,  PharmD, BCPS 04/18/2021 9:16 PM ED Clinical Pharmacist -  [phone]

## 2021-04-18 NOTE — H&P (Signed)
History and Physical    [employer]. TSV:[id number] DOB: 1951/04/09 DOA: 04/18/2021  PCP: Ladell Pier, MD   Patient coming from: Home  Chief Complaint: abdominal swelling and diarrhea.  HPI: [name] [name]. is a 70 y.o. male with medical history significant for CHF, a-fib, HTN, CVA in 2016, COPD who presents for evaluation of abdominal swelling and diarrhea.  He reports for the last few weeks he has had worsening abdominal swelling and distention.  He reports having multiple episodes of diarrhea every day for the last week.  He has not had any blood or mucus in the diarrhea he states.  He reports that whenever he eats he has diarrhea shortly after and he has had decreased appetite for the last 2 months and has had unintended weight loss.  States he saw his PCP 4 months ago prior to abdominal distention and diarrhea starting and has not been back to his doctor for evaluation.  States his abdomen has been very swollen making it hard for him to breathe at times.  Breathing is worse if he tries to lay flat or if he is very active.  He has not had any injury or trauma to his abdomen.  Denies any syncope.  States he has not had any recent travel and has had no raw or undercooked foods.  ED Course: He initially had a low blood pressure in the emergency room which improved during his ER stay.  He was found to have large volume of ascites on CT scan as well as a large right pleural effusion causing collapse of the right lower lung.  He is placed on oxygen by nasal cannula at 3 L as he initially had oxygen saturation of 87 to 89% on room air.  He is now satting in the mid to upper 90s on oxygen if he is laying on the gurney.  He has not been ambulated in the emergency room.  He has not had any episodes of diarrhea in the emergency room.  C. difficile test was ordered by the ER physician.  Lab work shows sodium 136 potassium 4.5 chloride 104 bicarb 20 creatinine 1.86 BUN 44 GFR 38 bilirubin 1.1 ammonia  26 AST 50 ALT 28 alk phos is 171 albumin 2.8 magnesium 2.1 calcium 9.2, CBC unremarkable, INR 1.1.  COVID swab negative, influenza A and B are negative.  CT scan shows large amount of ascites as well as abnormal liver findings suggestive of malignancy.  Hospitalist service was asked to admit for further management  Review of Systems:  General: Reports weakness and unintended weight loss. Denies fever, chills, night sweats. Denies dizziness.  HENT: Denies head trauma, headache, denies change in hearing, tinnitus.  Denies nasal bleeding.  Denies sore throat.  Denies difficulty swallowing Eyes: Denies blurry vision, pain in eye, drainage.  Denies discoloration of eyes. Neck: Denies pain.  Denies swelling.  Denies pain with movement. Cardiovascular: Denies chest pain, palpitations.  Reports edema.  Denies orthopnea Respiratory: Denies shortness of breath, cough.  Denies wheezing.  Denies sputum production Gastrointestinal: Reports swelling of abdomen and diarrhea.  Denies abdominal pain.  Denies nausea, vomiting.  Denies melena.  Denies hematemesis. Musculoskeletal: Denies limitation of movement. Denies deformity. Denies arthralgias or myalgias. Genitourinary: Denies pelvic pain.  Denies urinary frequency or hesitancy.  Denies dysuria.  Skin: Denies rash.  Denies petechiae, purpura, ecchymosis. Neurological: Denies syncope. Denies seizure activity. Denies paresthesia. Denies slurred speech, drooping face.  Denies visual change. Psychiatric: Denies depression, anxiety.  Denies hallucinations.  Past Medical History:  Diagnosis Date   Anxiety    CHF (congestive heart failure) (HCC)    GERD (gastroesophageal reflux disease)    Heavy smoker    Shortness of breath dyspnea    Stroke ([place])    a. Noted on CT 07/29/14    Past Surgical History:  Procedure Laterality Date   LEFT HEART CATHETERIZATION WITH CORONARY ANGIOGRAM N/A 07/28/2014   Procedure: LEFT HEART CATHETERIZATION WITH CORONARY ANGIOGRAM;   Surgeon: Sinclair Grooms, MD;  Location: [facility] CATH LAB;  Service: Cardiovascular;  Laterality: N/A;   LEG SURGERY     SPINE SURGERY      Social History  reports that he quit smoking about 6 years ago. His smoking use included cigarettes. He has never used smokeless tobacco. He reports current alcohol use of about 175.0 standard drinks per week. He reports that he does not use drugs.  Allergies  Allergen Reactions   Levaquin [Levofloxacin In D5w]     Generalized redness; received azithromycin, rocephin, and levaquin on the same day as reaction developed.    History reviewed. No pertinent family history.   Prior to Admission medications   Medication Sig Start Date End Date Taking? Authorizing Provider  atorvastatin (LIPITOR) 40 MG tablet Take 1 tablet (40 mg total) by mouth daily. 12/10/20   Ladell Pier, MD  bisoprolol (ZEBETA) 5 MG tablet Take 1 tablet (5 mg total) by mouth daily. 12/10/20   Ladell Pier, MD  fluticasone-salmeterol (ADVAIR DISKUS) 100-50 MCG/ACT AEPB Inhale 1 puff into the lungs 2 (two) times daily. 12/10/20   Ladell Pier, MD  furosemide (LASIX) 40 MG tablet Take 1 tablet by mouth once daily 02/25/21   Ladell Pier, MD  lisinopril (ZESTRIL) 5 MG tablet Take 1 tablet (5 mg total) by mouth daily. 12/10/20   Ladell Pier, MD    Physical Exam: Vitals:   04/18/21 2015 04/18/21 2030 04/18/21 2045 04/18/21 2100  BP: 105/75 109/67 109/68 103/65  Pulse: 82 78 80 80  Resp: 18 (!) 23 (!) 23 (!) 22  Temp:      SpO2: 98% 100% 97% 97%    Constitutional: NAD, calm, comfortable Vitals:   04/18/21 2015 04/18/21 2030 04/18/21 2045 04/18/21 2100  BP: 105/75 109/67 109/68 103/65  Pulse: 82 78 80 80  Resp: 18 (!) 23 (!) 23 (!) 22  Temp:      SpO2: 98% 100% 97% 97%   General: Thin, cachectic, frail appearing elderly male, Alert and oriented x3.  Eyes: EOMI, PERRL, conjunctivae normal.  Sclera nonicteric HENT:  [place]/AT, external ears normal.  Nares  patent without epistasis.  Mucous membranes are . Posterior pharynx clear Neck: Soft, normal range of motion, supple, no masses, Trachea midline Respiratory: Equal breath sounds in mid to upper lung fields with scattered rhonchi. Diminished breath sounds right lower lung field. no wheezing, no crackles. Normal respiratory effort. No accessory muscle use.  Cardiovascular: Irregularly irregular rhythm with normal rate, no murmurs / rubs / gallops. Has +1 lower extremity edema. 2+ pedal pulses.  Abdomen: Soft, no tenderness, distended, Has ascites. no rebound or guarding. No masses palpated. Bowel sounds normoactive Musculoskeletal: FROM. no cyanosis. No joint deformity upper and lower extremities. Normal muscle tone.  Skin: Warm, dry, intact no rashes, lesions, ulcers. No induration Neurologic: CN 2-12 grossly intact.  Normal speech.  Sensation intact,  Strength 4/5 in all extremities.   Psychiatric: Normal judgment and insight.  Normal mood.  Labs on Admission: I have personally reviewed following labs and imaging studies  CBC: Recent Labs  Lab 04/18/21 1558  WBC 9.2  NEUTROABS 8.0*  HGB 13.4  HCT 41.5  MCV 97.4  PLT 175    Basic Metabolic Panel: Recent Labs  Lab 04/18/21 1558  NA 136  K 4.5  CL 104  CO2 20*  GLUCOSE 83  BUN 44*  CREATININE 1.86*  CALCIUM 9.2  MG 2.1    GFR: CrCl cannot be calculated (Unknown ideal weight.).  Liver Function Tests: Recent Labs  Lab 04/18/21 1558  AST 50*  ALT 28  ALKPHOS 171*  BILITOT 1.1  PROT 7.2  ALBUMIN 2.8*    Urine analysis:    Component Value Date/Time   COLORURINE YELLOW 09/14/2015 1014   APPEARANCEUR CLEAR 09/14/2015 1014   LABSPEC 1.010 09/14/2015 1014   PHURINE 6.0 09/14/2015 1014   GLUCOSEU NEGATIVE 09/14/2015 1014   HGBUR NEGATIVE 09/14/2015 1014   [place] 09/14/2015 1014   KETONESUR NEGATIVE 09/14/2015 1014   PROTEINUR NEGATIVE 09/14/2015 1014   UROBILINOGEN 0.2 07/22/2014 1330   NITRITE  NEGATIVE 09/14/2015 1014   LEUKOCYTESUR NEGATIVE 09/14/2015 1014    Radiological Exams on Admission: DG Chest Port 1 View  Result Date: 04/18/2021 CLINICAL DATA:  Short of breath EXAM: PORTABLE CHEST 1 VIEW COMPARISON:  Chest x-ray 07/30/2014 FINDINGS: Persistently enlarged cardiac silhouette. The heart and mediastinal contours are unchanged. Aortic calcification. Right mid lower lung zone airspace opacity. No pulmonary edema. Interval increase in size of an at least small right pleural effusion. No pneumothorax. No acute osseous abnormality. IMPRESSION: 1. Interval increase in size of an at least small right pleural effusion. 2. Right mid lower lung zone airspace opacity that could represent atelectasis versus infection/inflammation. Followup PA and lateral chest X-ray is recommended in 3-4 weeks following therapy to ensure resolution and exclude underlying malignancy. Electronically Signed   By: Iven Finn M.D.   On: 04/18/2021 16:08   VAS [place] LOWER EXTREMITY VENOUS (DVT) (7a-7p)  Result Date: 04/18/2021  Lower Venous DVT Study Patient Name:  Kwesi Sangha.  Date of Exam:   04/18/2021 Medical Rec #: [id number]         Accession #:    [id number] Date of Birth: May 06, 1951         Patient Gender: M Patient Age:   11 years Exam Location:  [facility] Procedure:      VAS [place] LOWER EXTREMITY VENOUS (DVT) Referring Phys: JULIE HAVILAND --------------------------------------------------------------------------------  Indications: Swelling.  Risk Factors: None identified. Limitations: Poor ultrasound/tissue interface. Comparison Study: No prior studies. Performing Technologist: Oliver Hum RVT  Examination Guidelines: A complete evaluation includes B-mode imaging, spectral Doppler, color Doppler, and power Doppler as needed of all accessible portions of each vessel. Bilateral testing is considered an integral part of a complete examination. Limited examinations for reoccurring indications may be  performed as noted. The reflux portion of the exam is performed with the patient in reverse Trendelenburg.  +---------+---------------+---------+-----------+----------+--------------+ RIGHT    CompressibilityPhasicitySpontaneityPropertiesThrombus Aging +---------+---------------+---------+-----------+----------+--------------+ CFV      Full           Yes      Yes                                 +---------+---------------+---------+-----------+----------+--------------+ SFJ      Full                                                        +---------+---------------+---------+-----------+----------+--------------+  FV Prox  Full                                                        +---------+---------------+---------+-----------+----------+--------------+ FV Mid   Full                                                        +---------+---------------+---------+-----------+----------+--------------+ FV Distal               Yes      Yes                                 +---------+---------------+---------+-----------+----------+--------------+ PFV      Full                                                        +---------+---------------+---------+-----------+----------+--------------+ POP      Full           Yes      Yes                                 +---------+---------------+---------+-----------+----------+--------------+ PTV      Full                                                        +---------+---------------+---------+-----------+----------+--------------+ PERO     Full                                                        +---------+---------------+---------+-----------+----------+--------------+   +----+---------------+---------+-----------+----------+--------------+ LEFTCompressibilityPhasicitySpontaneityPropertiesThrombus Aging +----+---------------+---------+-----------+----------+--------------+ CFV Full           Yes       Yes                                 +----+---------------+---------+-----------+----------+--------------+     Summary: RIGHT: - There is no evidence of deep vein thrombosis in the lower extremity. However, portions of this examination were limited- see technologist comments above.  - No cystic structure found in the popliteal fossa.  LEFT: - No evidence of common femoral vein obstruction.  *See table(s) above for measurements and observations. Electronically signed by Deitra Mayo MD on 04/18/2021 at 5:44:35 PM.    Final    CT CHEST ABDOMEN PELVIS WO CONTRAST  Result Date: 04/18/2021 CLINICAL DATA:  Pleural effusion.  Shortness of breath. EXAM: CT CHEST, ABDOMEN AND PELVIS WITHOUT CONTRAST TECHNIQUE: Multidetector CT imaging of the chest, abdomen and pelvis was performed following the standard protocol  without IV contrast. COMPARISON:  Chest x-ray 04/18/2021 FINDINGS: CT CHEST FINDINGS Cardiovascular: Normal heart size. No significant pericardial effusion. The thoracic aorta is normal in caliber. Severe atherosclerotic plaque of the thoracic aorta. Three-vessel coronary artery calcifications. Mediastinum/Nodes: No gross hilar adenopathy, noting limited sensitivity for the detection of hilar adenopathy on this noncontrast study. No enlarged mediastinal or axillary lymph nodes. Thyroid gland, trachea, and esophagus demonstrate no significant findings. Lungs/Pleura: Mild-to-moderate centrilobular emphysematous changes. Collapse of the right lower lobe. No focal consolidation. No pulmonary nodule. No pulmonary mass. No pneumothorax. Moderate small volume right pleural effusion. No left pleural effusion. Musculoskeletal: No chest wall abnormality. No suspicious lytic or blastic osseous lesions. No acute displaced fracture. Multilevel degenerative changes of the spine. CT ABDOMEN PELVIS FINDINGS Hepatobiliary: Nodular hepatic contour. Heterogeneous appearance of the liver with likely underlying mass  lesions. No gallstones, gallbladder wall thickening, or pericholecystic fluid. No biliary dilatation. Pancreas: No focal lesion. Normal pancreatic contour. No surrounding inflammatory changes. No main pancreatic ductal dilatation. Spleen: Normal in size without focal abnormality. Adrenals/Urinary Tract: No adrenal nodule bilaterally. No nephrolithiasis and no hydronephrosis. No definite contour-deforming renal mass. No ureterolithiasis or hydroureter. The urinary bladder is unremarkable. Stomach/Bowel: Stomach is within normal limits. No evidence of definite bowel wall thickening or dilatation. No pneumatosis identified. Vascular/Lymphatic: No abdominal aorta or iliac aneurysm. Severe atherosclerotic plaque of the aorta and its branches. No gross abdominal, pelvic, or inguinal lymphadenopathy. Reproductive: The prostate is unremarkable. Other: Large volume ascites. Musculoskeletal: Small volume right inguinal hernia containing ascites. No suspicious lytic or blastic osseous lesions. No acute displaced fracture. Multilevel degenerative changes of the spine. IMPRESSION: 1. Moderate to large volume right pleural effusion. Associated collapse of the right lower lobe. 2. Question cirrhotic morphology of the liver with heterogeneous appearance of the parenchyma suggestive of underlying mass lesions. Concern for malignancy. Recommend nonemergent MRI liver protocol. When the patient is clinically stable and able to follow directions and hold their breath (preferably as an outpatient) further evaluation with dedicated abdominal MRI should be considered. 3. Large volume ascites. 4. Markedly limited evaluation on this noncontrast study. 5. Aortic Atherosclerosis (ICD10-I70.0) and Emphysema (ICD10-J43.9). Electronically Signed   By: Iven Finn M.D.   On: 04/18/2021 19:45    EKG: Independently reviewed.  EKG shows atrial fibrillation with right bundle branch block and left anterior fascicular block.  No acute ST elevation  or depression.  QTc prolonged at 532.  Reviewed previous EKGs and had prolonged QTC at that time as well  Assessment/Plan Principal Problem:   Ascites Mr. Newman is admitted to the progressive care unit.  He was monitored closely. IR consult placed for the morning for paracentesis with fluid analysis. Given Lasix tonight.  He takes Lasix at home by mouth but will give IV tonight and in the morning.  Monitor electrolytes and renal function. Saline lock. Check CBC in the morning  Active Problems:   Pleural effusion on right Patient with large right-sided pleural effusion causing collapse of the right lower lung.  IR consult placed for the morning for evaluation and thoracentesis.  Fluid will need to be sent for analysis for malignant cells    CKD (chronic kidney disease) stage 3, GFR 30-59 ml/min  Stable.  Avoid nephrotoxic medications and monitor renal function electrolytes with labs in morning    Hypoxemia Supplement oxygen provided as needed    COLD (chronic obstructive lung disease) Breo Ellipta substituted for Advair discus.  Supplemental oxygen provided to keep O2 sat between 92 to  96%.  Patient currently on 3 L to maintain oxygen saturation.  DuoNeb ordered every 6 hours as needed for shortness of breath, cough, wheeze    Essential hypertension Continue lisinopril and propranolol.  Monitor blood pressure    Abnormal CT of liver Abnormal liver appearance on CT scan suggestive of underlying malignancy which will need further work-up when patient is stabilized    Unintended weight loss With liver lesions which are suggestive of malignancy.  Need nonurgent MRI with liver protocol to further evaluate    PAF (paroxysmal atrial fibrillation)  Started on heparin infusion to fully anticoagulate and allow for further work-up with paracentesis and thoracentesis for ascites and pleural effusion Check EKG in morning    QT prolongation Avoid medications or could further prolong QTC     Diarrhea Check C. difficile testing and if positive will treat accordingly   DVT prophylaxis: Placed on Heparin infusion with a-fib on EKG, hx of a-fib and possible underlying malignancy.   Code Status:   Full Code  Family Communication:  Diagnosis and plan discussed with patient and family who is at bedside.  Verbalized understanding agree with plan.  Questions answered.  Further recommendations to follow as clinical indicated Disposition Plan:   Patient is from:  Home  Anticipated DC to:  Home  Anticipated DC date:  Anticipate 2 or midnight stay for stabilization of condition and workup.    Consults called:  Interventional radiology consult placed for AM.   Admission status:  Inpatient.    Yevonne Aline Kyden Potash MD [facility]  How to contact the [facility] Attending or Consulting provider [place] or covering provider during after hours [place], for this patient?   Check the care team in [facility] and look for a) attending/consulting [facility] provider listed and b) the [facility] team listed Log into [email or web address] and use [place]'s universal password to access. If you do not have the password, please contact the hospital operator. Locate the [facility] provider you are looking for under [facility] and page to a number that you can be directly reached. If you still have difficulty reaching the provider, please page the [facility] (Director on Call) for the Hospitalists listed on amion for assistance.  04/18/2021, 9:07 PM

## 2021-04-18 NOTE — Progress Notes (Signed)
Right lower extremity venous duplex has been completed. Preliminary results can be found in CV Proc through chart review.  Results were given to Dr. Particia Nearing.  04/18/21 5:19 PM Olen Cordial RVT

## 2021-04-19 ENCOUNTER — Inpatient Hospital Stay (HOSPITAL_COMMUNITY): Payer: Medicare HMO

## 2021-04-19 ENCOUNTER — Ambulatory Visit: Payer: Medicare HMO | Admitting: Cardiology

## 2021-04-19 ENCOUNTER — Other Ambulatory Visit (HOSPITAL_COMMUNITY): Payer: Medicare HMO

## 2021-04-19 DIAGNOSIS — J9621 Acute and chronic respiratory failure with hypoxia: Secondary | ICD-10-CM | POA: Diagnosis not present

## 2021-04-19 DIAGNOSIS — R188 Other ascites: Secondary | ICD-10-CM | POA: Diagnosis not present

## 2021-04-19 DIAGNOSIS — N183 Chronic kidney disease, stage 3 unspecified: Secondary | ICD-10-CM

## 2021-04-19 DIAGNOSIS — R197 Diarrhea, unspecified: Secondary | ICD-10-CM

## 2021-04-19 DIAGNOSIS — N179 Acute kidney failure, unspecified: Secondary | ICD-10-CM | POA: Diagnosis not present

## 2021-04-19 DIAGNOSIS — R932 Abnormal findings on diagnostic imaging of liver and biliary tract: Secondary | ICD-10-CM | POA: Diagnosis not present

## 2021-04-19 HISTORY — PX: IR PARACENTESIS: IMG2679

## 2021-04-19 LAB — COMPREHENSIVE METABOLIC PANEL
ALT: 23 U/L (ref 0–44)
AST: 45 U/L — ABNORMAL HIGH (ref 15–41)
Albumin: 2.2 g/dL — ABNORMAL LOW (ref 3.5–5.0)
Alkaline Phosphatase: 135 U/L — ABNORMAL HIGH (ref 38–126)
Anion gap: 11 (ref 5–15)
BUN: 43 mg/dL — ABNORMAL HIGH (ref 8–23)
CO2: 19 mmol/L — ABNORMAL LOW (ref 22–32)
Calcium: 8.2 mg/dL — ABNORMAL LOW (ref 8.9–10.3)
Chloride: 106 mmol/L (ref 98–111)
Creatinine, Ser: 1.74 mg/dL — ABNORMAL HIGH (ref 0.61–1.24)
GFR, Estimated: 41 mL/min — ABNORMAL LOW (ref >60)
Glucose, Bld: 85 mg/dL (ref 70–99)
Potassium: 4.5 mmol/L (ref 3.5–5.1)
Sodium: 136 mmol/L (ref 135–145)
Total Bilirubin: 1.1 mg/dL (ref 0.3–1.2)
Total Protein: 5.9 g/dL — ABNORMAL LOW (ref 6.5–8.1)

## 2021-04-19 LAB — GASTROINTESTINAL PANEL BY PCR, STOOL (REPLACES STOOL CULTURE)

## 2021-04-19 LAB — CBC
HCT: 34.1 % — ABNORMAL LOW (ref 39.0–52.0)
Hemoglobin: 10.9 g/dL — ABNORMAL LOW (ref 13.0–17.0)
MCH: 31.4 pg (ref 26.0–34.0)
MCHC: 32 g/dL (ref 30.0–36.0)
MCV: 98.3 fL (ref 80.0–100.0)
Platelets: 130 10*3/uL — ABNORMAL LOW (ref 150–400)
RBC: 3.47 MIL/uL — ABNORMAL LOW (ref 4.22–5.81)
RDW: 16.2 % — ABNORMAL HIGH (ref 11.5–15.5)
WBC: 7.2 10*3/uL (ref 4.0–10.5)
nRBC: 0 % (ref 0.0–0.2)

## 2021-04-19 LAB — URINALYSIS, ROUTINE W REFLEX MICROSCOPIC
Bilirubin Urine: NEGATIVE
Glucose, UA: NEGATIVE mg/dL
Hgb urine dipstick: NEGATIVE
Ketones, ur: NEGATIVE mg/dL
Leukocytes,Ua: NEGATIVE
Nitrite: NEGATIVE
Protein, ur: NEGATIVE mg/dL
Specific Gravity, Urine: 1.008 (ref 1.005–1.030)
pH: 5 (ref 5.0–8.0)

## 2021-04-19 LAB — HEPATITIS PANEL, ACUTE
HCV Ab: REACTIVE — AB
Hep A IgM: NONREACTIVE
Hep B C IgM: NONREACTIVE
Hepatitis B Surface Ag: NONREACTIVE

## 2021-04-19 LAB — ABO/RH: ABO/RH(D): A POS

## 2021-04-19 LAB — C DIFFICILE QUICK SCREEN W PCR REFLEX
C Diff antigen: NEGATIVE
C Diff interpretation: NOT DETECTED
C Diff toxin: NEGATIVE

## 2021-04-19 LAB — HEPARIN LEVEL (UNFRACTIONATED): Heparin Unfractionated: 0.5 IU/mL (ref 0.30–0.70)

## 2021-04-19 LAB — PSA: Prostatic Specific Antigen: 0.01 ng/mL (ref 0.00–4.00)

## 2021-04-19 MED ORDER — LIDOCAINE HCL 1 % IJ SOLN
INTRAMUSCULAR | Status: AC
  Filled 2021-04-19: qty 20

## 2021-04-19 MED ORDER — LIDOCAINE HCL (PF) 1 % IJ SOLN
INTRAMUSCULAR | Status: DC | PRN
  Administered 2021-04-19 – 2021-04-20 (×2): 10 mL

## 2021-04-19 NOTE — Procedures (Signed)
PROCEDURE SUMMARY:  Successful image-guided paracentesis from the right lower abdomen.  Yielded 9.0 liters of clear, very light yellow fluid.  No immediate complications.  EBL < 1 mL Patient tolerated well.  Procedure aborted at this amount due to hypotension, patient remained asymptomatic throughout. Residual fluid remains on post procedure [place].  Specimen was sent for labs.  Please see imaging section of Epic for full dictation.  Villa Herb PA-C 04/19/2021 12:09 PM

## 2021-04-19 NOTE — ED Notes (Signed)
Pt transported to IR 

## 2021-04-19 NOTE — Progress Notes (Signed)
[facility]                                                                              Patient Demographics  [name] [name], is a 70 y.o. male, DOB - 01-19-1951, ASN:[id number]  Admit date - 04/18/2021   Admitting Physician Eben Burow, MD  Outpatient Primary MD for the patient is Ladell Pier, MD  Outpatient specialists:   LOS - 1  days   Medical records reviewed and are as summarized below:    Chief Complaint  Patient presents with   Shortness of Breath       Brief summary   Patient is a 70 year old male with history of CHF, (echo in 08/2015 EF of 45 to 50% with grade 1 DD), A. fib, hypertension, CVA in 2016, COPD presented for abdominal swelling and diarrhea.  Patient reported that in the last few weeks he had worsening abdominal swelling, distention and multiple episodes of diarrhea every day for the 1 week.  Patient reported decreased appetite for the last 2 months and had at least 30lbs weight loss in the last year.  He reported getting short of breath, uses 2 L O2 at home.  Patient saw his PCP 4 months ago prior to abdominal distention and diarrhea and has not followed up since then.  Patient reported that his abdomen has become very small making it hard for him to breathe and worse on laying flat or exertion. In ED, CT chest abdomen pelvis showed large right-sided pleural effusion causing collapse of the right lower lung and large volume ascites.  Questionable cirrhotic morphology of the liver with heterogeneous appearance of parenchyma suggestive of underlying mass lesion, concerning for malignancy, recommended nonemergent MRI. Ammonia 26, AST 50, ALT 28, alk phos 171, creatinine 1.86.  C. difficile negative Patient was admitted for further work-up.  Assessment & Plan    Principal Problem:   Acute on chronic respiratory failure with hypoxia ([place]), large right-sided pleural effusion, right lower lung collapse -At baseline, uses 2 L O2 at  home, presented with shortness of breath, large right-sided pleural effusion with collapse of the right lower lung.  With ascites.  O2 sats currently 97% on 3 L. 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  Chest x-ray showed no focal consolidation, no mass -Continue DuoNebs as needed, Breo  Acute kidney  injury on CKD stage IIIb, borderline BP -Hold lisinopril, and Lasix for now.  Patient has received 2 doses of IV Lasix 80 -IV fluids discontinued given his history of CHF -No hydronephrosis on CT, obtain UA  Diarrhea -C. difficile negative, follow-up GI pathogen panel    PAF (paroxysmal atrial fibrillation) (HCC) -CHA2DS2-VASc score 5 (age, CHF, hypertension, prior history of CVA) -Rate controlled, continue beta-blocker, continue IV heparin drip -Patient was not on anticoagulation prior to admission  Chronic systolic CHF -Echo 7824 had shown EF of 45 to 50% G1 DD, presenting with large volume ascites and pleural effusion - will repeat echo, DC IV fluids -Received Lasix in ED, creatinine stable, 1.7    Essential  hypertension -BP currently soft, hold Lasix, lisinopril.  For now continue beta-blocker   Moderate protein calorie malnutrition, hypoalbuminemia, 2.2 Estimated body mass index is 21.9 kg/m as calculated from the following:   Height as of this encounter: 5' 11"  (1.803 m).   Weight as of this encounter: 71.2 kg.  Code Status: Full CODE STATUS DVT Prophylaxis:    IV heparin drip   Level of Care: Level of care: Progressive Family Communication: Discussed all imaging results, lab results, explained to the patient    Disposition Plan:     Status is: Inpatient  Remains inpatient appropriate because: Patient needs extensive work-up including thoracentesis, paracentesis, rule out any underlying malignancy, possible new diagnosis of cirrhosis, pending echo  Time Spent in minutes   40 minutes  Procedures:  Paracentesis  Consultants:   Interventional neurology  Antimicrobials:   Anti-infectives (From admission, onward)    None          Medications  Scheduled Meds:  atorvastatin  40 mg Oral Daily   bisoprolol  5 mg Oral Daily   fluticasone furoate-vilanterol  1 puff Inhalation Daily   lidocaine       lisinopril  5 mg Oral Daily   sodium chloride flush  3 mL Intravenous Q12H   traZODone  50 mg Oral QHS   Continuous Infusions:  sodium chloride     heparin 1,100 Units/hr (04/19/21 0737)   PRN Meds:.sodium chloride, HYDROmorphone (DILAUDID) injection, ipratropium-albuterol, lidocaine (PF), sodium chloride flush      Subjective:   [name] [name] was seen and examined today.  At the time of my examination, reported having shortness of breath, worse on lying flat due to distended abdomen.  No nausea or vomiting, had diarrhea for almost a week.  No chest pain, fevers or chills.  No dizziness.   Objective:   Vitals:   04/19/21 0602 04/19/21 0734 04/19/21 0933 04/19/21 0956  BP: 100/65 103/65 98/64 104/64  Pulse: 69 77 76   Resp: 16 18 16    Temp:  97.9 F (36.6 C)     TempSrc:  Oral    SpO2: 98% 97% 97%   Weight:  71.2 kg    Height:  5' 11"  (1.803 m)      Intake/Output Summary (Last 24 hours) at 04/19/2021 1029 Last data filed at 04/19/2021 0714 Gross per 24 hour  Intake 1090.8 ml  Output --  Net 1090.8 ml     Wt Readings from Last 3 Encounters:  04/19/21 71.2 kg  12/10/20 70.9 kg  09/14/15 93.4 kg     Exam General: Alert and oriented x 3, NAD Cardiovascular: S1 S2 auscultated, RRR Respiratory: Diminished breath sounds with rhonchi RLL Gastrointestinal: Soft, nontender, ++distended with ascites, + bowel sounds Ext: 1+ pedal edema bilaterally Neuro: no acute FND's  Psych: Normal affect and demeanor, alert and oriented x3    Data Reviewed:  I have personally reviewed following labs and imaging studies  Micro Results Recent Results (from the past 240 hour(s))  Resp Panel by RT-PCR (Flu A&B, Covid) Nasopharyngeal Swab     Status: None   Collection Time: 04/18/21  7:28 PM   Specimen: Nasopharyngeal Swab; Nasopharyngeal(NP) swabs in vial transport medium  Result Value Ref Range Status   SARS Coronavirus 2 by RT PCR NEGATIVE NEGATIVE Final    Comment: (NOTE) SARS-CoV-2 target nucleic acids are NOT DETECTED.  The SARS-CoV-2 RNA is generally detectable in upper respiratory specimens during the acute phase of infection. The lowest concentration of SARS-CoV-2 viral copies this assay can detect is 138 copies/mL. A negative result does not preclude SARS-Cov-2 infection and should not be used as the sole basis for treatment or other patient management decisions. A negative result may occur with  improper specimen collection/handling, submission of specimen other than nasopharyngeal swab, presence of viral mutation(s) within the areas targeted by this assay, and inadequate number of viral copies(<138 copies/mL). A negative result must be combined with clinical observations, patient history, and epidemiological information. The expected  result is Negative.  Fact Sheet for Patients:  EntrepreneurPulse.com.au  Fact Sheet for Healthcare Providers:  IncredibleEmployment.be  This test is no t yet approved or cleared by the [place] FDA and  has been authorized for detection and/or diagnosis of SARS-CoV-2 by FDA under an Emergency Use Authorization (EUA). This EUA will remain  in effect (meaning this test can be used) for the duration of the COVID-19 declaration under Section 564(b)(1) of the Act, 21 U.S.C.section 360bbb-3(b)(1), unless the authorization is terminated  or revoked sooner.       Influenza A by PCR NEGATIVE NEGATIVE Final   Influenza B by PCR NEGATIVE NEGATIVE Final    Comment: (NOTE) The Xpert Xpress SARS-CoV-2/FLU/RSV plus assay is intended as an aid in the diagnosis of influenza from Nasopharyngeal swab specimens and should not be used as a sole basis for treatment. Nasal washings and aspirates are unacceptable for Xpert Xpress SARS-CoV-2/FLU/RSV testing.  Fact Sheet for Patients: EntrepreneurPulse.com.au  Fact Sheet for Healthcare Providers: IncredibleEmployment.be  This test is not yet approved or cleared by the [place] FDA and has been authorized for detection and/or diagnosis of SARS-CoV-2 by FDA under an Emergency Use Authorization (EUA). This EUA will remain in effect (meaning this test can be used) for the duration of the COVID-19 declaration under Section 564(b)(1) of the Act, 21 U.S.C. section 360bbb-3(b)(1), unless the authorization is terminated or revoked.  Performed at [facility], [place] [address] [place], [place] [address]   C Difficile Quick Screen w PCR reflex     Status: None   Collection Time: 04/19/21  7:36 AM   Specimen: Stool  Result Value Ref Range Status   C Diff antigen NEGATIVE NEGATIVE Final   C Diff toxin NEGATIVE NEGATIVE Final   C Diff interpretation No C. difficile  detected.  Final    Comment: Performed at [facility], [place] [address], [place], [place] [address]    Radiology Reports DG Chest [place] 1 View  Result Date: 04/18/2021 CLINICAL DATA:  Short of breath EXAM: PORTABLE CHEST 1 VIEW COMPARISON:  Chest x-ray 07/30/2014 FINDINGS: Persistently enlarged cardiac silhouette. The heart and mediastinal contours are unchanged. Aortic calcification. Right mid lower lung zone airspace opacity. No pulmonary edema. Interval increase in size of an at least small  right pleural effusion. No pneumothorax. No acute osseous abnormality. IMPRESSION: 1. Interval increase in size of an at least small right pleural effusion. 2. Right mid lower lung zone airspace opacity that could represent atelectasis versus infection/inflammation. Followup PA and lateral chest X-ray is recommended in 3-4 weeks following therapy to ensure resolution and exclude underlying malignancy. Electronically Signed   By: Iven Finn M.D.   On: 04/18/2021 16:08   VAS [place] LOWER EXTREMITY VENOUS (DVT) (7a-7p)  Result Date: 04/18/2021  Lower Venous DVT Study Patient Name:  [name] [name].  Date of Exam:   04/18/2021 Medical Rec #: [id number]         Accession #:    [id number] Date of Birth: 11-20-1950         Patient Gender: M Patient Age:   32 years Exam Location:  [facility] Procedure:      VAS [place] LOWER EXTREMITY VENOUS (DVT) Referring Phys: JULIE HAVILAND --------------------------------------------------------------------------------  Indications: Swelling.  Risk Factors: None identified. Limitations: Poor ultrasound/tissue interface. Comparison Study: No prior studies. Performing Technologist: Oliver Hum RVT  Examination Guidelines: A complete evaluation includes B-mode imaging, spectral Doppler, color Doppler, and power Doppler as needed of all accessible portions of each vessel. Bilateral testing is considered an integral part of a complete examination. Limited examinations for  reoccurring indications may be performed as noted. The reflux portion of the exam is performed with the patient in reverse Trendelenburg.  +---------+---------------+---------+-----------+----------+--------------+ RIGHT    CompressibilityPhasicitySpontaneityPropertiesThrombus Aging +---------+---------------+---------+-----------+----------+--------------+ CFV      Full           Yes      Yes                                 +---------+---------------+---------+-----------+----------+--------------+ SFJ      Full                                                        +---------+---------------+---------+-----------+----------+--------------+ FV Prox  Full                                                        +---------+---------------+---------+-----------+----------+--------------+ FV Mid   Full                                                        +---------+---------------+---------+-----------+----------+--------------+ FV Distal               Yes      Yes                                 +---------+---------------+---------+-----------+----------+--------------+ PFV      Full                                                        +---------+---------------+---------+-----------+----------+--------------+  POP      Full           Yes      Yes                                 +---------+---------------+---------+-----------+----------+--------------+ PTV      Full                                                        +---------+---------------+---------+-----------+----------+--------------+ PERO     Full                                                        +---------+---------------+---------+-----------+----------+--------------+   +----+---------------+---------+-----------+----------+--------------+ LEFTCompressibilityPhasicitySpontaneityPropertiesThrombus Aging +----+---------------+---------+-----------+----------+--------------+  CFV Full           Yes      Yes                                 +----+---------------+---------+-----------+----------+--------------+     Summary: RIGHT: - There is no evidence of deep vein thrombosis in the lower extremity. However, portions of this examination were limited- see technologist comments above.  - No cystic structure found in the popliteal fossa.  LEFT: - No evidence of common femoral vein obstruction.  *See table(s) above for measurements and observations. Electronically signed by Deitra Mayo MD on 04/18/2021 at 5:44:35 PM.    Final    CT CHEST ABDOMEN PELVIS WO CONTRAST  Result Date: 04/18/2021 CLINICAL DATA:  Pleural effusion.  Shortness of breath. EXAM: CT CHEST, ABDOMEN AND PELVIS WITHOUT CONTRAST TECHNIQUE: Multidetector CT imaging of the chest, abdomen and pelvis was performed following the standard protocol without IV contrast. COMPARISON:  Chest x-ray 04/18/2021 FINDINGS: CT CHEST FINDINGS Cardiovascular: Normal heart size. No significant pericardial effusion. The thoracic aorta is normal in caliber. Severe atherosclerotic plaque of the thoracic aorta. Three-vessel coronary artery calcifications. Mediastinum/Nodes: No gross hilar adenopathy, noting limited sensitivity for the detection of hilar adenopathy on this noncontrast study. No enlarged mediastinal or axillary lymph nodes. Thyroid gland, trachea, and esophagus demonstrate no significant findings. Lungs/Pleura: Mild-to-moderate centrilobular emphysematous changes. Collapse of the right lower lobe. No focal consolidation. No pulmonary nodule. No pulmonary mass. No pneumothorax. Moderate small volume right pleural effusion. No left pleural effusion. Musculoskeletal: No chest wall abnormality. No suspicious lytic or blastic osseous lesions. No acute displaced fracture. Multilevel degenerative changes of the spine. CT ABDOMEN PELVIS FINDINGS Hepatobiliary: Nodular hepatic contour. Heterogeneous appearance of the liver  with likely underlying mass lesions. No gallstones, gallbladder wall thickening, or pericholecystic fluid. No biliary dilatation. Pancreas: No focal lesion. Normal pancreatic contour. No surrounding inflammatory changes. No main pancreatic ductal dilatation. Spleen: Normal in size without focal abnormality. Adrenals/Urinary Tract: No adrenal nodule bilaterally. No nephrolithiasis and no hydronephrosis. No definite contour-deforming renal mass. No ureterolithiasis or hydroureter. The urinary bladder is unremarkable. Stomach/Bowel: Stomach is within normal limits. No evidence of definite bowel wall thickening or dilatation. No pneumatosis identified. Vascular/Lymphatic: No abdominal aorta or iliac aneurysm. Severe atherosclerotic plaque of the aorta and its branches. No gross  abdominal, pelvic, or inguinal lymphadenopathy. Reproductive: The prostate is unremarkable. Other: Large volume ascites. Musculoskeletal: Small volume right inguinal hernia containing ascites. No suspicious lytic or blastic osseous lesions. No acute displaced fracture. Multilevel degenerative changes of the spine. IMPRESSION: 1. Moderate to large volume right pleural effusion. Associated collapse of the right lower lobe. 2. Question cirrhotic morphology of the liver with heterogeneous appearance of the parenchyma suggestive of underlying mass lesions. Concern for malignancy. Recommend nonemergent MRI liver protocol. When the patient is clinically stable and able to follow directions and hold their breath (preferably as an outpatient) further evaluation with dedicated abdominal MRI should be considered. 3. Large volume ascites. 4. Markedly limited evaluation on this noncontrast study. 5. Aortic Atherosclerosis (ICD10-I70.0) and Emphysema (ICD10-J43.9). Electronically Signed   By: Iven Finn M.D.   On: 04/18/2021 19:45    Lab Data:  CBC: Recent Labs  Lab 04/18/21 1558 04/19/21 0620  WBC 9.2 7.2  NEUTROABS 8.0*  --   HGB 13.4 10.9*   HCT 41.5 34.1*  MCV 97.4 98.3  PLT 171 740*   Basic Metabolic Panel: Recent Labs  Lab 04/18/21 1558 04/19/21 0620  NA 136 136  K 4.5 4.5  CL 104 106  CO2 20* 19*  GLUCOSE 83 85  BUN 44* 43*  CREATININE 1.86* 1.74*  CALCIUM 9.2 8.2*  MG 2.1  --    GFR: Estimated Creatinine Clearance: 39.2 mL/min (A) (by C-G formula based on SCr of 1.74 mg/dL (H)). Liver Function Tests: Recent Labs  Lab 04/18/21 1558 04/19/21 0620  AST 50* 45*  ALT 28 23  ALKPHOS 171* 135*  BILITOT 1.1 1.1  PROT 7.2 5.9*  ALBUMIN 2.8* 2.2*   No results for input(s): LIPASE, AMYLASE in the last 168 hours. Recent Labs  Lab 04/18/21 1558  AMMONIA 26   Coagulation Profile: Recent Labs  Lab 04/18/21 1558  INR 1.1   Cardiac Enzymes: No results for input(s): CKTOTAL, CKMB, CKMBINDEX, TROPONINI in the last 168 hours. BNP (last 3 results) No results for input(s): PROBNP in the last 8760 hours. HbA1C: No results for input(s): HGBA1C in the last 72 hours. CBG: No results for input(s): GLUCAP in the last 168 hours. Lipid Profile: No results for input(s): CHOL, HDL, LDLCALC, TRIG, CHOLHDL, LDLDIRECT in the last 72 hours. Thyroid Function Tests: No results for input(s): TSH, T4TOTAL, FREET4, T3FREE, THYROIDAB in the last 72 hours. Anemia Panel: No results for input(s): VITAMINB12, FOLATE, FERRITIN, TIBC, IRON, RETICCTPCT in the last 72 hours. Urine analysis:    Component Value Date/Time   COLORURINE YELLOW 09/14/2015 1014   APPEARANCEUR CLEAR 09/14/2015 1014   LABSPEC 1.010 09/14/2015 1014   PHURINE 6.0 09/14/2015 1014   GLUCOSEU NEGATIVE 09/14/2015 1014   HGBUR NEGATIVE 09/14/2015 1014   BILIRUBINUR NEGATIVE 09/14/2015 1014   KETONESUR NEGATIVE 09/14/2015 1014   PROTEINUR NEGATIVE 09/14/2015 1014   UROBILINOGEN 0.2 07/22/2014 1330   NITRITE NEGATIVE 09/14/2015 1014   LEUKOCYTESUR NEGATIVE 09/14/2015 1014     Demetrica Zipp M.D. [facility] 04/19/2021, 10:29 AM  Available via  Epic secure chat 7am-7pm After 7 pm, please refer to night coverage provider listed on amion.

## 2021-04-19 NOTE — ED Notes (Signed)
Pt ate lunch tray. Tolerated well. Denies nausea

## 2021-04-19 NOTE — Progress Notes (Signed)
ANTICOAGULATION CONSULT NOTE   Pharmacy Consult for Heparin Indication: atrial fibrillation  Allergies  Allergen Reactions   Levaquin [Levofloxacin In D5w]     Generalized redness; received azithromycin, rocephin, and levaquin on the same day as reaction developed.    Patient Measurements: Height: 5\' 11"  (180.3 cm) Weight: 71.2 kg (157 lb) IBW/kg (Calculated) : 75.3 Heparin Dosing Weight: 71 kg  Vital Signs: Temp: 97.9 F (36.6 C) (10/24 0734) Temp Source: Oral (10/24 0734) BP: 98/64 (10/24 0933) Pulse Rate: 76 (10/24 0933)  Labs: Recent Labs    04/18/21 1558 04/19/21 0620 04/19/21 0807  HGB 13.4 10.9*  --   HCT 41.5 34.1*  --   PLT 171 130*  --   LABPROT 13.8  --   --   INR 1.1  --   --   HEPARINUNFRC  --   --  0.50  CREATININE 1.86* 1.74*  --      Estimated Creatinine Clearance: 39.2 mL/min (A) (by C-G formula based on SCr of 1.74 mg/dL (H)).   Medical History: Past Medical History:  Diagnosis Date   Anxiety    CHF (congestive heart failure) (HCC)    GERD (gastroesophageal reflux disease)    Heavy smoker    Shortness of breath dyspnea    Stroke (HCC)    a. Noted on CT 07/29/14    Medications:  (Not in a hospital admission) Scheduled:   atorvastatin  40 mg Oral Daily   bisoprolol  5 mg Oral Daily   fluticasone furoate-vilanterol  1 puff Inhalation Daily   lidocaine       lisinopril  5 mg Oral Daily   sodium chloride flush  3 mL Intravenous Q12H   traZODone  50 mg Oral QHS   Infusions:   sodium chloride     heparin 1,100 Units/hr (04/19/21 0737)   PRN: sodium chloride, HYDROmorphone (DILAUDID) injection, ipratropium-albuterol, sodium chloride flush  Assessment: 70 yom with a history of anxiety, HF, GERD, smoker, SOB, stroke, HLD. Patient is presenting with SOB. Heparin per pharmacy consult placed for atrial fibrillation.  Initial heparin level therapeutic on 1100 units/hr  Goal of Therapy:  Heparin level 0.3-0.7 units/ml Monitor platelets  by anticoagulation protocol: Yes   Plan:  Continue heparin gtt at 1100 units/hr Daily heparin level, CBC, s/s bleeding F/u long term AC plan and ability to transition to PO  04/21/21, PharmD Clinical Pharmacist ED Pharmacist Phone # [phone] 04/19/2021 9:49 AM

## 2021-04-19 NOTE — ED Notes (Signed)
Pt sitting up eating lunch tray

## 2021-04-19 NOTE — Progress Notes (Signed)
IR Rad Eval was ordered per ER attending physician. Was not indicated on initial order Para or Thora today vs tomorrow. Per Lynnette Caffey, IR PA, we would complete the para today. Attending physician from yesterday, Trey Paula who placed the order did not indicate a max, albumin and lab request. Per Lynnette Caffey, IR PA, as long as blood pressure remains stable do not remove more than 10 liters, cytology- non pap is the only lab and albumin to be decided upon admission to the floor. Stopped at 9 liters due to low blood pressure. Last BP was 92/58 which was also indicated in the chart. Thora order with labs needs to be placed by admitting physician today.   Gabrielle Dare RT-R.

## 2021-04-20 ENCOUNTER — Inpatient Hospital Stay (HOSPITAL_COMMUNITY): Payer: Medicare HMO

## 2021-04-20 ENCOUNTER — Other Ambulatory Visit (HOSPITAL_COMMUNITY): Payer: Medicare HMO

## 2021-04-20 DIAGNOSIS — R188 Other ascites: Secondary | ICD-10-CM | POA: Diagnosis not present

## 2021-04-20 DIAGNOSIS — R932 Abnormal findings on diagnostic imaging of liver and biliary tract: Secondary | ICD-10-CM | POA: Diagnosis not present

## 2021-04-20 DIAGNOSIS — R0609 Other forms of dyspnea: Secondary | ICD-10-CM

## 2021-04-20 DIAGNOSIS — I10 Essential (primary) hypertension: Secondary | ICD-10-CM | POA: Diagnosis not present

## 2021-04-20 DIAGNOSIS — J9621 Acute and chronic respiratory failure with hypoxia: Secondary | ICD-10-CM | POA: Diagnosis not present

## 2021-04-20 HISTORY — PX: IR THORACENTESIS ASP PLEURAL SPACE W/IMG GUIDE: IMG5380

## 2021-04-20 LAB — CANCER ANTIGEN 19-9: CA 19-9: 177 U/mL — ABNORMAL HIGH (ref 0–35)

## 2021-04-20 LAB — COMPREHENSIVE METABOLIC PANEL
ALT: 24 U/L (ref 0–44)
AST: 44 U/L — ABNORMAL HIGH (ref 15–41)
Albumin: 2.1 g/dL — ABNORMAL LOW (ref 3.5–5.0)
Alkaline Phosphatase: 142 U/L — ABNORMAL HIGH (ref 38–126)
Anion gap: 10 (ref 5–15)
BUN: 44 mg/dL — ABNORMAL HIGH (ref 8–23)
CO2: 20 mmol/L — ABNORMAL LOW (ref 22–32)
Calcium: 8.4 mg/dL — ABNORMAL LOW (ref 8.9–10.3)
Chloride: 105 mmol/L (ref 98–111)
Creatinine, Ser: 1.83 mg/dL — ABNORMAL HIGH (ref 0.61–1.24)
GFR, Estimated: 39 mL/min — ABNORMAL LOW (ref >60)
Glucose, Bld: 107 mg/dL — ABNORMAL HIGH (ref 70–99)
Potassium: 4.6 mmol/L (ref 3.5–5.1)
Sodium: 135 mmol/L (ref 135–145)
Total Bilirubin: 0.9 mg/dL (ref 0.3–1.2)
Total Protein: 6.1 g/dL — ABNORMAL LOW (ref 6.5–8.1)

## 2021-04-20 LAB — ECHOCARDIOGRAM COMPLETE
AR max vel: 0.72 cm2
AV Area VTI: 0.68 cm2
AV Area mean vel: 0.7 cm2
AV Mean grad: 17 mmHg
AV Peak grad: 24.6 mmHg
Ao pk vel: 2.48 m/s
Area-P 1/2: 4.33 cm2
Calc EF: 37.7 %
Height: 71 in
MV M vel: 3.79 m/s
MV Peak grad: 57.5 mmHg
S' Lateral: 4.8 cm
Single Plane A2C EF: 40.7 %
Single Plane A4C EF: 38.2 %
Weight: 2512 oz

## 2021-04-20 LAB — CBC
HCT: 36.9 % — ABNORMAL LOW (ref 39.0–52.0)
Hemoglobin: 12 g/dL — ABNORMAL LOW (ref 13.0–17.0)
MCH: 31.4 pg (ref 26.0–34.0)
MCHC: 32.5 g/dL (ref 30.0–36.0)
MCV: 96.6 fL (ref 80.0–100.0)
Platelets: 126 10*3/uL — ABNORMAL LOW (ref 150–400)
RBC: 3.82 MIL/uL — ABNORMAL LOW (ref 4.22–5.81)
RDW: 16.2 % — ABNORMAL HIGH (ref 11.5–15.5)
WBC: 6.6 10*3/uL (ref 4.0–10.5)
nRBC: 0 % (ref 0.0–0.2)

## 2021-04-20 LAB — LACTATE DEHYDROGENASE, PLEURAL OR PERITONEAL FLUID: LD, Fluid: 49 U/L — ABNORMAL HIGH (ref 3–23)

## 2021-04-20 LAB — BODY FLUID CELL COUNT WITH DIFFERENTIAL
Eos, Fluid: 0 %
Lymphs, Fluid: 48 %
Monocyte-Macrophage-Serous Fluid: 9 % — ABNORMAL LOW (ref 50–90)
Neutrophil Count, Fluid: 42 % — ABNORMAL HIGH (ref 0–25)
Total Nucleated Cell Count, Fluid: 111 cu mm (ref 0–1000)

## 2021-04-20 LAB — AFP TUMOR MARKER: AFP, Serum, Tumor Marker: 2957 ng/mL — ABNORMAL HIGH (ref 0.0–8.4)

## 2021-04-20 LAB — PROTEIN, PLEURAL OR PERITONEAL FLUID: Total protein, fluid: <3 g/dL

## 2021-04-20 LAB — CYTOLOGY - NON PAP

## 2021-04-20 LAB — GLUCOSE, PLEURAL OR PERITONEAL FLUID: Glucose, Fluid: 113 mg/dL

## 2021-04-20 LAB — HEPARIN LEVEL (UNFRACTIONATED): Heparin Unfractionated: 0.68 IU/mL (ref 0.30–0.70)

## 2021-04-20 LAB — GRAM STAIN

## 2021-04-20 LAB — LACTATE DEHYDROGENASE: LDH: 180 U/L (ref 98–192)

## 2021-04-20 LAB — CEA: CEA: 3.2 ng/mL (ref 0.0–4.7)

## 2021-04-20 MED ORDER — ALBUMIN HUMAN 25 % IV SOLN
12.5000 g | Freq: Four times a day (QID) | INTRAVENOUS | Status: AC
Start: 2021-04-20 — End: 2021-04-20
  Administered 2021-04-20 (×3): 12.5 g via INTRAVENOUS
  Filled 2021-04-20 (×4): qty 50

## 2021-04-20 MED ORDER — ENSURE ENLIVE PO LIQD
237.0000 mL | Freq: Two times a day (BID) | ORAL | Status: DC
  Administered 2021-04-21: 237 mL via ORAL

## 2021-04-20 MED ORDER — INFLUENZA VAC A&B SA ADJ QUAD 0.5 ML IM PRSY
0.5000 mL | PREFILLED_SYRINGE | INTRAMUSCULAR | Status: DC
  Filled 2021-04-20: qty 0.5

## 2021-04-20 MED ORDER — OXYCODONE-ACETAMINOPHEN 5-325 MG PO TABS
1.0000 | ORAL_TABLET | ORAL | Status: DC | PRN
Start: 2021-04-20 — End: 2021-04-22
  Administered 2021-04-20 – 2021-04-21 (×3): 2 via ORAL
  Administered 2021-04-22: 1 via ORAL
  Filled 2021-04-20: qty 1
  Filled 2021-04-20 (×3): qty 2

## 2021-04-20 MED ORDER — LIDOCAINE HCL 1 % IJ SOLN
INTRAMUSCULAR | Status: AC
  Filled 2021-04-20: qty 20

## 2021-04-20 NOTE — Progress Notes (Signed)
[facility]                                                                              Patient Demographics  [name] [name], is a 70 y.o. male, DOB - April 18, 1951, WCB:[id number]  Admit date - 04/18/2021   Admitting Physician Eben Burow, MD  Outpatient Primary MD for the patient is Ladell Pier, MD  Outpatient specialists:   LOS - 2  days   Medical records reviewed and are as summarized below:    Chief Complaint  Patient presents with   Shortness of Breath       Brief summary   Patient is a 70 year old male with history of CHF, (echo in 08/2015 EF of 45 to 50% with grade 1 DD), A. fib, hypertension, CVA in 2016, COPD presented for abdominal swelling and diarrhea.  Patient reported that in the last few weeks he had worsening abdominal swelling, distention and multiple episodes of diarrhea every day for the 1 week.  Patient reported decreased appetite for the last 2 months and had at least 30lbs weight loss in the last year.  He reported getting short of breath, uses 2 L O2 at home.  Patient saw his PCP 4 months ago prior to abdominal distention and diarrhea and has not followed up since then.  Patient reported that his abdomen has become very small making it hard for him to breathe and worse on laying flat or exertion. In ED, CT chest abdomen pelvis showed large right-sided pleural effusion causing collapse of the right lower lung and large volume ascites.  Questionable cirrhotic morphology of the liver with heterogeneous appearance of parenchyma suggestive of underlying mass lesion, concerning for malignancy, recommended nonemergent MRI. Ammonia 26, AST 50, ALT 28, alk phos 171, creatinine 1.86.  C. difficile negative Patient was admitted for further work-up.  Assessment & Plan    Principal Problem:   Acute on chronic respiratory failure with hypoxia ([place]), large right-sided pleural effusion, right lower lung collapse -At baseline, uses 2 L O2 at  home, presented with shortness of breath, large right-sided pleural effusion with collapse of the right lower lung.  With ascites.   -In ED, was placed on 3 L O2 -Underwent thoracentesis today 1.5 L removed, follow studies --Patient also has history of chronic systolic CHF, EF 45 to 61% in 2017, repeat 2D echo to assess any interval worsening -Venous Dopplers showed no DVT  Active Problems: Ascites ?Liver cirrhosis and underlying mass lesion concerning for malignancy, weight loss, hypoalbuminemia -States he quit drinking 8 years ago, prior to that at least 1 case of beer daily for >30 years -Underwent high-volume paracentesis on 10/24, 9 L removed, continue IV albumin for 3 doses -Alk phos elevated, ammonia level normal, INR 1.1, mild thrombocytopenia  -Obtain right upper quadrant ultrasound to assess cirrhosis ?  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 difficile negative, GI pathogen panel negative    PAF (paroxysmal atrial fibrillation) (HCC) -CHA2DS2-VASc score 5 (age, CHF, hypertension, prior history of CVA) -Rate controlled, currently on IV heparin drip -Patient was not on anticoagulation prior to admission  Chronic systolic CHF -Echo 3220 had shown EF of 45 to 50% G1 DD, presenting with large volume ascites and pleural effusion -IV fluids discontinued, patient had received IV Lasix in ED, creatinine slightly trended up    Essential hypertension -BP soft, hold  Lasix, lisinopril, beta-blocker   Moderate protein calorie malnutrition, hypoalbuminemia, 2.2 Estimated body mass index is 21.9 kg/m as calculated from the following:   Height as of this encounter: 5' 11"  (1.803 m).   Weight as of this encounter: 71.2 kg.  Code Status: Full CODE STATUS DVT Prophylaxis:    IV heparin drip   Level of Care: Level of care: Progressive Family Communication: Discussed all imaging results, lab results, explained to the patient    Disposition Plan:     Status is: Inpatient  Remains inpatient appropriate because: Patient needs extensive work-up including thoracentesis, paracentesis, rule out any underlying malignancy, possible new diagnosis of cirrhosis, pending echo  Time Spent in minutes 35 minutes  Procedures:  Paracentesis 10/24 Thoracentesis 10/25  Consultants:   Interventional neurology Gastroenterology  Antimicrobials:   Anti-infectives (From admission, onward)    None          Medications  Scheduled Meds:  atorvastatin  40 mg Oral Daily   fluticasone furoate-vilanterol  1 puff Inhalation Daily   lidocaine       sodium chloride flush  3 mL Intravenous Q12H   traZODone  50 mg Oral QHS   Continuous Infusions:  sodium chloride     albumin human 12.5 g (04/20/21 0816)   heparin 1,050 Units/hr (04/20/21 0727)   PRN Meds:.sodium chloride, ipratropium-albuterol, lidocaine (PF), oxyCODONE-acetaminophen, sodium chloride flush      Subjective:   [name] [name] was seen and examined today.  Feeling better after paracentesis, still has some shortness of breath, was awaiting thoracentesis at the time of my examination.  No nausea or vomiting, abdominal pain.  No fevers or chills.     Objective:   Vitals:   04/20/21 1015 04/20/21 1215 04/20/21 1230 04/20/21 1316  BP: (!) 91/59 (!) 91/59 (!) 92/57 (!) 100/58  Pulse: 66 68 68 75  Resp: 15 14 15 18   Temp:      TempSrc:      SpO2: 95% 100% 100% 97%  Weight:      Height:         Intake/Output Summary (Last 24 hours) at 04/20/2021 1523 Last data filed at 04/19/2021 1814 Gross per 24 hour  Intake --  Output 400 ml  Net -400 ml     Wt Readings from Last 3 Encounters:  04/19/21 71.2 kg  12/10/20 70.9 kg  09/14/15 93.4 kg   Physical Exam General: Alert and oriented x 3, NAD Cardiovascular: S1 S2 clear, RRR. No pedal edema b/l Respiratory: Diminished breath sounds rt base  Gastrointestinal: Soft, distended (better after paracentesis), nontender, NBS Ext: 1+ pedal edema bilaterally Neuro: no new deficits Psych: Normal affect and demeanor, alert and oriented x3    Data Reviewed:  I have personally reviewed following labs and  imaging studies  Micro Results Recent Results (from the past 240 hour(s))  Resp Panel by RT-PCR (Flu A&B, Covid) Nasopharyngeal Swab     Status: None   Collection Time: 04/18/21  7:28 PM   Specimen: Nasopharyngeal Swab; Nasopharyngeal(NP) swabs in vial transport medium  Result Value Ref Range Status   SARS Coronavirus 2 by RT PCR NEGATIVE NEGATIVE Final    Comment: (NOTE) SARS-CoV-2 target nucleic acids are NOT DETECTED.  The SARS-CoV-2 RNA is generally detectable in upper respiratory specimens during the acute phase of infection. The lowest concentration of SARS-CoV-2 viral copies this assay can detect is 138 copies/mL. A negative result does not preclude SARS-Cov-2 infection and should not be used as the sole basis for treatment or other patient management decisions. A negative result may occur with  improper specimen collection/handling, submission of specimen other than nasopharyngeal swab, presence of viral mutation(s) within the areas targeted by this assay, and inadequate number of viral copies(<138 copies/mL). A negative result must be combined with clinical observations, patient history, and epidemiological information. The expected result is Negative.  Fact Sheet for Patients:   EntrepreneurPulse.com.au  Fact Sheet for Healthcare Providers:  IncredibleEmployment.be  This test is no t yet approved or cleared by the [place] FDA and  has been authorized for detection and/or diagnosis of SARS-CoV-2 by FDA under an Emergency Use Authorization (EUA). This EUA will remain  in effect (meaning this test can be used) for the duration of the COVID-19 declaration under Section 564(b)(1) of the Act, 21 U.S.C.section 360bbb-3(b)(1), unless the authorization is terminated  or revoked sooner.       Influenza A by PCR NEGATIVE NEGATIVE Final   Influenza B by PCR NEGATIVE NEGATIVE Final    Comment: (NOTE) The Xpert Xpress SARS-CoV-2/FLU/RSV plus assay is intended as an aid in the diagnosis of influenza from Nasopharyngeal swab specimens and should not be used as a sole basis for treatment. Nasal washings and aspirates are unacceptable for Xpert Xpress SARS-CoV-2/FLU/RSV testing.  Fact Sheet for Patients: EntrepreneurPulse.com.au  Fact Sheet for Healthcare Providers: IncredibleEmployment.be  This test is not yet approved or cleared by the [place] FDA and has been authorized for detection and/or diagnosis of SARS-CoV-2 by FDA under an Emergency Use Authorization (EUA). This EUA will remain in effect (meaning this test can be used) for the duration of the COVID-19 declaration under Section 564(b)(1) of the Act, 21 U.S.C. section 360bbb-3(b)(1), unless the authorization is terminated or revoked.  Performed at [facility], [place] [address] [place], [place] [address]   Gastrointestinal Panel by PCR , Stool     Status: None   Collection Time: 04/19/21  7:36 AM   Specimen: Stool  Result Value Ref Range Status   Campylobacter species NOT DETECTED NOT DETECTED Final   Plesimonas shigelloides NOT DETECTED NOT DETECTED Final   Salmonella species NOT DETECTED NOT DETECTED Final    Yersinia enterocolitica NOT DETECTED NOT DETECTED Final   Vibrio species NOT DETECTED NOT DETECTED Final   Vibrio cholerae NOT DETECTED NOT DETECTED Final   Enteroaggregative E coli (EAEC) NOT DETECTED NOT DETECTED Final   Enteropathogenic E coli (EPEC) NOT DETECTED NOT DETECTED Final   Enterotoxigenic E coli (ETEC) NOT DETECTED NOT DETECTED Final   Shiga like toxin producing E coli (STEC) NOT DETECTED NOT DETECTED Final   Shigella/Enteroinvasive E coli (EIEC) NOT DETECTED NOT DETECTED Final   Cryptosporidium NOT DETECTED NOT DETECTED Final   Cyclospora cayetanensis NOT DETECTED NOT DETECTED Final  Entamoeba histolytica NOT DETECTED NOT DETECTED Final   Giardia lamblia NOT DETECTED NOT DETECTED Final   Adenovirus F40/41 NOT DETECTED NOT DETECTED Final   Astrovirus NOT DETECTED NOT DETECTED Final   Norovirus GI/GII NOT DETECTED NOT DETECTED Final   Rotavirus A NOT DETECTED NOT DETECTED Final   Sapovirus (I, II, IV, and V) NOT DETECTED NOT DETECTED Final    Comment: Performed at [facility], [address] [place], [place] [address]  C Difficile Quick Screen w PCR reflex     Status: None   Collection Time: 04/19/21  7:36 AM   Specimen: Stool  Result Value Ref Range Status   C Diff antigen NEGATIVE NEGATIVE Final   C Diff toxin NEGATIVE NEGATIVE Final   C Diff interpretation No C. difficile detected.  Final    Comment: Performed at [facility], [place] [address] [place], [place] [address]  Gram stain     Status: None (Preliminary result)   Collection Time: 04/20/21 10:46 AM   Specimen: Fluid  Result Value Ref Range Status   Specimen Description FLUID  Final   Special Requests   Final     RIGHT LUNG Performed at [facility], [place] [address] [place], [place] [address]    Gram Stain PENDING  Incomplete   Report Status PENDING  Incomplete    Radiology Reports DG Chest 1 View  Result Date: 04/20/2021 CLINICAL DATA:  Pleural effusion. Additional history  provided: Status post right thoracentesis. EXAM: CHEST  1 VIEW COMPARISON:  Radiographs 04/20/2021 and earlier. CT chest 04/18/2021. Prior chest FINDINGS: Mild cardiomegaly, unchanged. There has been interval right thoracentesis. There is a small residual right pleural effusion. Associated right basilar atelectasis. The left lung is clear. No evidence of pneumothorax. No acute bony abnormality identified. IMPRESSION: Interval right thoracentesis with small residual right pleural effusion. Associated right basilar atelectasis. Pneumonia at the right lung base cannot be excluded and clinical correlation is recommended. No evidence of pneumothorax. Mild cardiomegaly, unchanged. Electronically Signed   By: Kellie Simmering D.O.   On: 04/20/2021 11:39   DG CHEST PORT 1 VIEW  Result Date: 04/20/2021 CLINICAL DATA:  Respiratory distress. EXAM: PORTABLE CHEST 1 VIEW COMPARISON:  Chest radiograph and CT dated 04/18/2021. FINDINGS: Complete opacification of the right hemithorax, progressed since the prior studies. There is deviation of the trachea to the right the midline. The left lung is clear. No pneumothorax. No acute osseous pathology. IMPRESSION: Complete opacification of the right hemithorax, progressed since the prior studies. Electronically Signed   By: Anner Crete M.D.   On: 04/20/2021 02:36   DG Chest Port 1 View  Result Date: 04/18/2021 CLINICAL DATA:  Short of breath EXAM: PORTABLE CHEST 1 VIEW COMPARISON:  Chest x-ray 07/30/2014 FINDINGS: Persistently enlarged cardiac silhouette. The heart and mediastinal contours are unchanged. Aortic calcification. Right mid lower lung zone airspace opacity. No pulmonary edema. Interval increase in size of an at least small right pleural effusion. No pneumothorax. No acute osseous abnormality. IMPRESSION: 1. Interval increase in size of an at least small right pleural effusion. 2. Right mid lower lung zone airspace opacity that could represent atelectasis versus  infection/inflammation. Followup PA and lateral chest X-ray is recommended in 3-4 weeks following therapy to ensure resolution and exclude underlying malignancy. Electronically Signed   By: Iven Finn M.D.   On: 04/18/2021 16:08   VAS [place] LOWER EXTREMITY VENOUS (DVT) (7a-7p)  Result Date: 04/18/2021  Lower Venous DVT Study Patient Name:  [employer].  Date of Exam:   04/18/2021 Medical Rec #: [id number]         Accession #:    [id number] Date of Birth: 02-16-1951         Patient Gender: M Patient Age:   3 years Exam Location:  [facility] [place] Procedure:      VAS [place] LOWER EXTREMITY VENOUS (DVT) Referring Phys: JULIE HAVILAND --------------------------------------------------------------------------------  Indications: Swelling.  Risk Factors: None identified. Limitations: Poor ultrasound/tissue interface. Comparison Study: No prior studies. Performing Technologist: Oliver Hum RVT  Examination Guidelines: A complete evaluation includes B-mode imaging, spectral Doppler, color Doppler, and power Doppler as needed of all accessible portions of each vessel. Bilateral testing is considered an integral part of a complete examination. Limited examinations for reoccurring indications may be performed as noted. The reflux portion of the exam is performed with the patient in reverse Trendelenburg.  +---------+---------------+---------+-----------+----------+--------------+ RIGHT    CompressibilityPhasicitySpontaneityPropertiesThrombus Aging +---------+---------------+---------+-----------+----------+--------------+ CFV      Full           Yes      Yes                                 +---------+---------------+---------+-----------+----------+--------------+ SFJ      Full                                                        +---------+---------------+---------+-----------+----------+--------------+ FV Prox  Full                                                         +---------+---------------+---------+-----------+----------+--------------+ FV Mid   Full                                                        +---------+---------------+---------+-----------+----------+--------------+ FV Distal               Yes      Yes                                 +---------+---------------+---------+-----------+----------+--------------+ PFV      Full                                                        +---------+---------------+---------+-----------+----------+--------------+ POP      Full           Yes      Yes                                 +---------+---------------+---------+-----------+----------+--------------+ PTV      Full                                                        +---------+---------------+---------+-----------+----------+--------------+  PERO     Full                                                        +---------+---------------+---------+-----------+----------+--------------+   +----+---------------+---------+-----------+----------+--------------+ LEFTCompressibilityPhasicitySpontaneityPropertiesThrombus Aging +----+---------------+---------+-----------+----------+--------------+ CFV Full           Yes      Yes                                 +----+---------------+---------+-----------+----------+--------------+     Summary: RIGHT: - There is no evidence of deep vein thrombosis in the lower extremity. However, portions of this examination were limited- see technologist comments above.  - No cystic structure found in the popliteal fossa.  LEFT: - No evidence of common femoral vein obstruction.  *See table(s) above for measurements and observations. Electronically signed by Deitra Mayo MD on 04/18/2021 at 5:44:35 PM.    Final    CT CHEST ABDOMEN PELVIS WO CONTRAST  Result Date: 04/18/2021 CLINICAL DATA:  Pleural effusion.  Shortness of breath. EXAM: CT CHEST, ABDOMEN AND PELVIS WITHOUT CONTRAST  TECHNIQUE: Multidetector CT imaging of the chest, abdomen and pelvis was performed following the standard protocol without IV contrast. COMPARISON:  Chest x-ray 04/18/2021 FINDINGS: CT CHEST FINDINGS Cardiovascular: Normal heart size. No significant pericardial effusion. The thoracic aorta is normal in caliber. Severe atherosclerotic plaque of the thoracic aorta. Three-vessel coronary artery calcifications. Mediastinum/Nodes: No gross hilar adenopathy, noting limited sensitivity for the detection of hilar adenopathy on this noncontrast study. No enlarged mediastinal or axillary lymph nodes. Thyroid gland, trachea, and esophagus demonstrate no significant findings. Lungs/Pleura: Mild-to-moderate centrilobular emphysematous changes. Collapse of the right lower lobe. No focal consolidation. No pulmonary nodule. No pulmonary mass. No pneumothorax. Moderate small volume right pleural effusion. No left pleural effusion. Musculoskeletal: No chest wall abnormality. No suspicious lytic or blastic osseous lesions. No acute displaced fracture. Multilevel degenerative changes of the spine. CT ABDOMEN PELVIS FINDINGS Hepatobiliary: Nodular hepatic contour. Heterogeneous appearance of the liver with likely underlying mass lesions. No gallstones, gallbladder wall thickening, or pericholecystic fluid. No biliary dilatation. Pancreas: No focal lesion. Normal pancreatic contour. No surrounding inflammatory changes. No main pancreatic ductal dilatation. Spleen: Normal in size without focal abnormality. Adrenals/Urinary Tract: No adrenal nodule bilaterally. No nephrolithiasis and no hydronephrosis. No definite contour-deforming renal mass. No ureterolithiasis or hydroureter. The urinary bladder is unremarkable. Stomach/Bowel: Stomach is within normal limits. No evidence of definite bowel wall thickening or dilatation. No pneumatosis identified. Vascular/Lymphatic: No abdominal aorta or iliac aneurysm. Severe atherosclerotic plaque of  the aorta and its branches. No gross abdominal, pelvic, or inguinal lymphadenopathy. Reproductive: The prostate is unremarkable. Other: Large volume ascites. Musculoskeletal: Small volume right inguinal hernia containing ascites. No suspicious lytic or blastic osseous lesions. No acute displaced fracture. Multilevel degenerative changes of the spine. IMPRESSION: 1. Moderate to large volume right pleural effusion. Associated collapse of the right lower lobe. 2. Question cirrhotic morphology of the liver with heterogeneous appearance of the parenchyma suggestive of underlying mass lesions. Concern for malignancy. Recommend nonemergent MRI liver protocol. When the patient is clinically stable and able to follow directions and hold their breath (preferably as an outpatient) further evaluation with dedicated abdominal MRI should be considered. 3. Large volume ascites. 4. Markedly limited evaluation on this noncontrast  study. 5. Aortic Atherosclerosis (ICD10-I70.0) and Emphysema (ICD10-J43.9). Electronically Signed   By: Iven Finn M.D.   On: 04/18/2021 19:45   IR Paracentesis  Result Date: 04/19/2021 INDICATION: Patient presented to the ED with a new onset abdominal swelling and diarrhea, found to have large volume ascites and large right pleural effusion. Request to IR for diagnostic and therapeutic paracentesis. EXAM: ULTRASOUND GUIDED DIAGNOSTIC AND THERAPEUTIC PARACENTESIS MEDICATIONS: 8 mL 1% lidocaine COMPLICATIONS: None immediate. PROCEDURE: Informed written consent was obtained from the patient after a discussion of the risks, benefits and alternatives to treatment. A timeout was performed prior to the initiation of the procedure. Initial ultrasound scanning demonstrates a large amount of ascites within the right lower abdominal quadrant. The right lower abdomen was prepped and draped in the usual sterile fashion. 1% lidocaine was used for local anesthesia. Following this, a 19 gauge, 7-cm, Yueh  catheter was introduced. An ultrasound image was saved for documentation purposes. The paracentesis was performed. The catheter was removed and a dressing was applied. The patient tolerated the procedure well without immediate post procedural complication. FINDINGS: A total of approximately 9.0 L of clear, very light yellow fluid was removed. Samples were sent to the laboratory as requested by the clinical team. IMPRESSION: Successful ultrasound-guided paracentesis yielding 9.0 liters of peritoneal fluid. Read by Candiss Norse, PA-C Electronically Signed   By: Miachel Roux M.D.   On: 04/19/2021 14:44   IR THORACENTESIS ASP PLEURAL SPACE W/IMG GUIDE  Result Date: 04/20/2021 INDICATION: Patient with a history of CHF admitted with increased shortness of breath and found to have a large right pleural effusion. Interventional radiology asked to perform a diagnostic and therapeutic thoracentesis. EXAM: ULTRASOUND GUIDED THORACENTESIS MEDICATIONS: 1% lidocaine 10 mL COMPLICATIONS: None immediate. PROCEDURE: An ultrasound guided thoracentesis was thoroughly discussed with the patient and questions answered. The benefits, risks, alternatives and complications were also discussed. The patient understands and wishes to proceed with the procedure. Written consent was obtained. Ultrasound was performed to localize and mark an adequate pocket of fluid in the right chest. The area was then prepped and draped in the normal sterile fashion. 1% Lidocaine was used for local anesthesia. Under ultrasound guidance a 6 Fr Safe-T-Centesis catheter was introduced. Thoracentesis was performed. The catheter was removed and a dressing applied. FINDINGS: A total of approximately 1.5 L of clear yellow fluid was removed. Samples were sent to the laboratory as requested by the clinical team. IMPRESSION: Successful ultrasound guided right thoracentesis yielding 1.5 L of pleural fluid. Read by: Soyla Dryer, NP Electronically Signed    By: Jacqulynn Cadet M.D.   On: 04/20/2021 12:06    Lab Data:  CBC: Recent Labs  Lab 04/18/21 1558 04/19/21 0620 04/20/21 0428  WBC 9.2 7.2 6.6  NEUTROABS 8.0*  --   --   HGB 13.4 10.9* 12.0*  HCT 41.5 34.1* 36.9*  MCV 97.4 98.3 96.6  PLT 171 130* 127*   Basic Metabolic Panel: Recent Labs  Lab 04/18/21 1558 04/19/21 0620 04/20/21 0428  NA 136 136 135  K 4.5 4.5 4.6  CL 104 106 105  CO2 20* 19* 20*  GLUCOSE 83 85 107*  BUN 44* 43* 44*  CREATININE 1.86* 1.74* 1.83*  CALCIUM 9.2 8.2* 8.4*  MG 2.1  --   --    GFR: Estimated Creatinine Clearance: 37.3 mL/min (A) (by C-G formula based on SCr of 1.83 mg/dL (H)). Liver Function Tests: Recent Labs  Lab 04/18/21 1558 04/19/21 0620 04/20/21 0428  AST 50*  45* 44*  ALT 28 23 24   ALKPHOS 171* 135* 142*  BILITOT 1.1 1.1 0.9  PROT 7.2 5.9* 6.1*  ALBUMIN 2.8* 2.2* 2.1*   No results for input(s): LIPASE, AMYLASE in the last 168 hours. Recent Labs  Lab 04/18/21 1558  AMMONIA 26   Coagulation Profile: Recent Labs  Lab 04/18/21 1558  INR 1.1   Cardiac Enzymes: No results for input(s): CKTOTAL, CKMB, CKMBINDEX, TROPONINI in the last 168 hours. BNP (last 3 results) No results for input(s): PROBNP in the last 8760 hours. HbA1C: No results for input(s): HGBA1C in the last 72 hours. CBG: No results for input(s): GLUCAP in the last 168 hours. Lipid Profile: No results for input(s): CHOL, HDL, LDLCALC, TRIG, CHOLHDL, LDLDIRECT in the last 72 hours. Thyroid Function Tests: No results for input(s): TSH, T4TOTAL, FREET4, T3FREE, THYROIDAB in the last 72 hours. Anemia Panel: No results for input(s): VITAMINB12, FOLATE, FERRITIN, TIBC, IRON, RETICCTPCT in the last 72 hours. Urine analysis:    Component Value Date/Time   COLORURINE YELLOW 04/19/2021 1039   APPEARANCEUR CLEAR 04/19/2021 1039   LABSPEC 1.008 04/19/2021 1039   PHURINE 5.0 04/19/2021 1039   GLUCOSEU NEGATIVE 04/19/2021 1039   HGBUR NEGATIVE 04/19/2021  [place] 04/19/2021 [place] 04/19/2021 1039   PROTEINUR NEGATIVE 04/19/2021 1039   UROBILINOGEN 0.2 07/22/2014 1330   NITRITE NEGATIVE 04/19/2021 [place] 04/19/2021 1039     Hajar Penninger M.D. [facility] 04/20/2021, 3:23 PM  Available via Epic secure chat 7am-7pm After 7 pm, please refer to night coverage provider listed on amion.

## 2021-04-20 NOTE — ED Notes (Addendum)
Pt called out and found by RN in respiratory distress. Pt on 2L  at baseline with oxygen saturation drop from 94-96% to 86%. Pt was sat up in bed and placed on 15 lpm NR. Distress gradually decreased with increased oxygen and position change. Pt reported that he rolled over and was suddenly not able to breathe. MD paged, stat chest xray order obtained.

## 2021-04-20 NOTE — Consult Note (Signed)
Consultation  Referring Provider: Dr. Tana Coast    Primary Care Physician:  Ladell Pier, MD Primary Gastroenterologist: Althia Forts        Reason for Consultation: Ascites, pleural effusion, loss of weight, elevated AFP and CA 19-9, new liver lesions on CT         HPI:   [name] [name]. is a 70 y.o. male with a past medical history significant for CHF, A. fib, hypertension, CVA in 2016 and COPD, who initially presented to the ER in 04/18/2021 for evaluation of abdominal swelling and diarrhea.  We are consulted in regards to ascites, loss of weight, elevated AFP and CA 19-9 and new liver lesions on the CT.    At time of admission patient reported he had a few weeks of worsening abdominal swelling and distention with multiple episodes of diarrhea every day over the past week.  Also described that whenever he would eat he would have diarrhea shortly after and had decreased appetite for the last 2 months with unintended weight loss.  Apparently saw his PCP 4 months ago prior to all of this starting and had not been back.  His abdomen is becoming very swollen making it hard for him to breathe at times, worse if he tried to lay flat was very active.    Today, the patient tells me that he just feels ill, reports a 30 pound weight loss without trying over the past year.  Most concerning to him is that he has had diarrhea recently just a couple of times a day but sometimes these are very urgent and accidents happen.  Apparently he says "I never have diarrhea normally", so this is very strange for him and started about 6 to 8 days ago.  Though history is somewhat foggy and he tells me that occasionally he will have a solid stool here and there.  Apparently used some Pepto at home which seemed to help.  Denies any recent antibiotic use.    Also discusses some abdominal distention which is "been there a long time".  Tells me he felt slightly better after paracentesis the other day, but cannot tell if the  fluid is coming back or not.  Does describe a history of a pack of beer a day for 30+ years, but has not had a drink in the past 9.    Denies fever, chills or blood in his stool.  ED course: Large volume of ascites on CT as well as a right pleural effusion causing collapse of the right lower lung, placed on oxygen by nasal cannula at 3 L, C. difficile was ordered, lab work showed sodium 136, potassium 4.5, creatinine 1.86, BUN 44, bili 1.1, AST 50, ALT 28, alk phos 171, INR 1.1, COVID negative  GI history: None  Past Medical History:  Diagnosis Date   Anxiety    CHF (congestive heart failure) (HCC)    GERD (gastroesophageal reflux disease)    Heavy smoker    Shortness of breath dyspnea    Stroke ([place])    a. Noted on CT 07/29/14    Past Surgical History:  Procedure Laterality Date   IR PARACENTESIS  04/19/2021   IR THORACENTESIS ASP PLEURAL SPACE W/IMG GUIDE  04/20/2021   LEFT HEART CATHETERIZATION WITH CORONARY ANGIOGRAM N/A 07/28/2014   Procedure: LEFT HEART CATHETERIZATION WITH CORONARY ANGIOGRAM;  Surgeon: Sinclair Grooms, MD;  Location: [facility] CATH LAB;  Service: Cardiovascular;  Laterality: N/A;   LEG SURGERY  SPINE SURGERY      Family History: No GI cancers or liver problems  Social History   Tobacco Use   Smoking status: Former    Types: Cigarettes    Quit date: 07/22/2014    Years since quitting: 6.7   Smokeless tobacco: Never  Vaping Use   Vaping Use: Never used  Substance Use Topics   Alcohol use: Yes    Alcohol/week: 175.0 standard drinks    Types: 175 Cans of beer per week   Drug use: No    Prior to Admission medications   Medication Sig Start Date End Date Taking? Authorizing Provider  atorvastatin (LIPITOR) 40 MG tablet Take 1 tablet (40 mg total) by mouth daily. 12/10/20   Ladell Pier, MD  bisoprolol (ZEBETA) 5 MG tablet Take 1 tablet (5 mg total) by mouth daily. 12/10/20   Ladell Pier, MD  fluticasone-salmeterol (ADVAIR DISKUS) 100-50  MCG/ACT AEPB Inhale 1 puff into the lungs 2 (two) times daily. 12/10/20   Ladell Pier, MD  furosemide (LASIX) 40 MG tablet Take 1 tablet by mouth once daily 02/25/21   Ladell Pier, MD  lisinopril (ZESTRIL) 5 MG tablet Take 1 tablet (5 mg total) by mouth daily. 12/10/20   Ladell Pier, MD    Current Facility-Administered Medications  Medication Dose Route Frequency Provider Last Rate Last Admin   0.9 %  sodium chloride infusion  250 mL Intravenous PRN Chotiner, Yevonne Aline, MD       albumin human 25 % solution 12.5 g  12.5 g Intravenous Q6H Rai, Ripudeep K, MD 60 mL/hr at 04/20/21 0816 12.5 g at 04/20/21 0816   atorvastatin (LIPITOR) tablet 40 mg  40 mg Oral Daily Chotiner, Yevonne Aline, MD   40 mg at 04/20/21 1238   fluticasone furoate-vilanterol (BREO ELLIPTA) 100-25 MCG/ACT 1 puff  1 puff Inhalation Daily Chotiner, Yevonne Aline, MD   1 puff at 04/20/21 0817   heparin ADULT infusion 100 units/mL (25000 units/216m)  1,050 Units/hr Intravenous Continuous OBertis Ruddy RPH 10.5 mL/hr at 04/20/21 0727 1,050 Units/hr at 04/20/21 0727   ipratropium-albuterol (DUONEB) 0.5-2.5 (3) MG/3ML nebulizer solution 3 mL  3 mL Nebulization Q6H PRN Chotiner, BYevonne Aline MD   3 mL at 04/19/21 0614   lidocaine (PF) (XYLOCAINE) 1 % injection    PRN WCandiss NorseA, PA-C   10 mL at 04/20/21 1049   lidocaine (XYLOCAINE) 1 % (with pres) injection            oxyCODONE-acetaminophen (PERCOCET/ROXICET) 5-325 MG per tablet 1-2 tablet  1-2 tablet Oral Q4H PRN Rai, Ripudeep K, MD       sodium chloride flush (NS) 0.9 % injection 3 mL  3 mL Intravenous Q12H Chotiner, BYevonne Aline MD   3 mL at 04/20/21 1239   sodium chloride flush (NS) 0.9 % injection 3 mL  3 mL Intravenous PRN Chotiner, BYevonne Aline MD       traZODone (DESYREL) tablet 50 mg  50 mg Oral QHS Chotiner, BYevonne Aline MD   50 mg at 04/19/21 2242    Allergies as of 04/18/2021 - Review Complete 04/18/2021  Allergen Reaction Noted   Levaquin [levofloxacin  in d5w]  07/22/2014     Review of Systems:    Constitutional: No fever or chills Skin: No rash Cardiovascular: No chest pain Respiratory: No SOB  Gastrointestinal: See HPI and otherwise negative Genitourinary: No dysuria  Neurological: No headache, dizziness or syncope Musculoskeletal: No new muscle or joint pain  Hematologic: No bleeding  Psychiatric: No history of depression or anxiety    Physical Exam:  Vital signs in last 24 hours: Pulse Rate:  [34-75] 75 (10/25 1316) Resp:  [10-21] 18 (10/25 1316) BP: (82-111)/(53-72) 100/58 (10/25 1316) SpO2:  [92 %-100 %] 97 % (10/25 1316) Last BM Date: 04/20/21 General:   Pleasant ill-appearing Caucasian male appears to be in NAD, Well developed, Well nourished, alert and cooperative Head:  Normocephalic and atraumatic. Eyes:   PEERL, EOMI. No icterus. Conjunctiva pink. Ears:  Normal auditory acuity. Neck:  Supple Throat: Oral cavity and pharynx without inflammation, swelling or lesion.  Lungs: Respirations even and unlabored.  Diminished breath sounds on the right,   no wheezes, crackles, or rhonchi.  Heart: Normal S1, S2. No MRG. Regular rate and rhythm. 1+ pedal edema bilaterally Abdomen:  Soft, moderate distention, nontender. No rebound or guarding. Normal bowel sounds. No appreciable masses or hepatomegaly. Rectal:  Not performed.  Msk:  Symmetrical without gross deformities. Peripheral pulses intact.  Extremities:  Without edema, no deformity or joint abnormality.  Neurologic:  Alert and  oriented x4;  grossly normal neurologically.  Skin:   Dry and intact without significant lesions or rashes. Psychiatric: Demonstrates good judgement and reason without abnormal affect or behaviors.  LAB RESULTS: Recent Labs    04/18/21 1558 04/19/21 0620 04/20/21 0428  WBC 9.2 7.2 6.6  HGB 13.4 10.9* 12.0*  HCT 41.5 34.1* 36.9*  PLT 171 130* 126*   BMET Recent Labs    04/18/21 1558 04/19/21 0620 04/20/21 0428  NA 136 136 135  K  4.5 4.5 4.6  CL 104 106 105  CO2 20* 19* 20*  GLUCOSE 83 85 107*  BUN 44* 43* 44*  CREATININE 1.86* 1.74* 1.83*  CALCIUM 9.2 8.2* 8.4*   LFT Recent Labs    04/20/21 0428  PROT 6.1*  ALBUMIN 2.1*  AST 44*  ALT 24  ALKPHOS 142*  BILITOT 0.9   PT/INR Recent Labs    04/18/21 1558  LABPROT 13.8  INR 1.1    STUDIES: DG Chest 1 View  Result Date: 04/20/2021 CLINICAL DATA:  Pleural effusion. Additional history provided: Status post right thoracentesis. EXAM: CHEST  1 VIEW COMPARISON:  Radiographs 04/20/2021 and earlier. CT chest 04/18/2021. Prior chest FINDINGS: Mild cardiomegaly, unchanged. There has been interval right thoracentesis. There is a small residual right pleural effusion. Associated right basilar atelectasis. The left lung is clear. No evidence of pneumothorax. No acute bony abnormality identified. IMPRESSION: Interval right thoracentesis with small residual right pleural effusion. Associated right basilar atelectasis. Pneumonia at the right lung base cannot be excluded and clinical correlation is recommended. No evidence of pneumothorax. Mild cardiomegaly, unchanged. Electronically Signed   By: Kellie Simmering D.O.   On: 04/20/2021 11:39   DG CHEST PORT 1 VIEW  Result Date: 04/20/2021 CLINICAL DATA:  Respiratory distress. EXAM: PORTABLE CHEST 1 VIEW COMPARISON:  Chest radiograph and CT dated 04/18/2021. FINDINGS: Complete opacification of the right hemithorax, progressed since the prior studies. There is deviation of the trachea to the right the midline. The left lung is clear. No pneumothorax. No acute osseous pathology. IMPRESSION: Complete opacification of the right hemithorax, progressed since the prior studies. Electronically Signed   By: Anner Crete M.D.   On: 04/20/2021 02:36   DG Chest Port 1 View  Result Date: 04/18/2021 CLINICAL DATA:  Short of breath EXAM: PORTABLE CHEST 1 VIEW COMPARISON:  Chest x-ray 07/30/2014 FINDINGS: Persistently enlarged cardiac  silhouette. The heart and  mediastinal contours are unchanged. Aortic calcification. Right mid lower lung zone airspace opacity. No pulmonary edema. Interval increase in size of an at least small right pleural effusion. No pneumothorax. No acute osseous abnormality. IMPRESSION: 1. Interval increase in size of an at least small right pleural effusion. 2. Right mid lower lung zone airspace opacity that could represent atelectasis versus infection/inflammation. Followup PA and lateral chest X-ray is recommended in 3-4 weeks following therapy to ensure resolution and exclude underlying malignancy. Electronically Signed   By: Iven Finn M.D.   On: 04/18/2021 16:08   VAS [place] LOWER EXTREMITY VENOUS (DVT) (7a-7p)  Result Date: 04/18/2021  Lower Venous DVT Study Patient Name:  Zyree Traynham.  Date of Exam:   04/18/2021 Medical Rec #: [id number]         Accession #:    [id number] Date of Birth: March 01, 1951         Patient Gender: M Patient Age:   27 years Exam Location:  [facility] Procedure:      VAS [place] LOWER EXTREMITY VENOUS (DVT) Referring Phys: JULIE HAVILAND --------------------------------------------------------------------------------  Indications: Swelling.  Risk Factors: None identified. Limitations: Poor ultrasound/tissue interface. Comparison Study: No prior studies. Performing Technologist: Oliver Hum RVT  Examination Guidelines: A complete evaluation includes B-mode imaging, spectral Doppler, color Doppler, and power Doppler as needed of all accessible portions of each vessel. Bilateral testing is considered an integral part of a complete examination. Limited examinations for reoccurring indications may be performed as noted. The reflux portion of the exam is performed with the patient in reverse Trendelenburg.  +---------+---------------+---------+-----------+----------+--------------+ RIGHT    CompressibilityPhasicitySpontaneityPropertiesThrombus Aging  +---------+---------------+---------+-----------+----------+--------------+ CFV      Full           Yes      Yes                                 +---------+---------------+---------+-----------+----------+--------------+ SFJ      Full                                                        +---------+---------------+---------+-----------+----------+--------------+ FV Prox  Full                                                        +---------+---------------+---------+-----------+----------+--------------+ FV Mid   Full                                                        +---------+---------------+---------+-----------+----------+--------------+ FV Distal               Yes      Yes                                 +---------+---------------+---------+-----------+----------+--------------+ PFV      Full                                                        +---------+---------------+---------+-----------+----------+--------------+  POP      Full           Yes      Yes                                 +---------+---------------+---------+-----------+----------+--------------+ PTV      Full                                                        +---------+---------------+---------+-----------+----------+--------------+ PERO     Full                                                        +---------+---------------+---------+-----------+----------+--------------+   +----+---------------+---------+-----------+----------+--------------+ LEFTCompressibilityPhasicitySpontaneityPropertiesThrombus Aging +----+---------------+---------+-----------+----------+--------------+ CFV Full           Yes      Yes                                 +----+---------------+---------+-----------+----------+--------------+     Summary: RIGHT: - There is no evidence of deep vein thrombosis in the lower extremity. However, portions of this examination were limited- see  technologist comments above.  - No cystic structure found in the popliteal fossa.  LEFT: - No evidence of common femoral vein obstruction.  *See table(s) above for measurements and observations. Electronically signed by Deitra Mayo MD on 04/18/2021 at 5:44:35 PM.    Final    CT CHEST ABDOMEN PELVIS WO CONTRAST  Result Date: 04/18/2021 CLINICAL DATA:  Pleural effusion.  Shortness of breath. EXAM: CT CHEST, ABDOMEN AND PELVIS WITHOUT CONTRAST TECHNIQUE: Multidetector CT imaging of the chest, abdomen and pelvis was performed following the standard protocol without IV contrast. COMPARISON:  Chest x-ray 04/18/2021 FINDINGS: CT CHEST FINDINGS Cardiovascular: Normal heart size. No significant pericardial effusion. The thoracic aorta is normal in caliber. Severe atherosclerotic plaque of the thoracic aorta. Three-vessel coronary artery calcifications. Mediastinum/Nodes: No gross hilar adenopathy, noting limited sensitivity for the detection of hilar adenopathy on this noncontrast study. No enlarged mediastinal or axillary lymph nodes. Thyroid gland, trachea, and esophagus demonstrate no significant findings. Lungs/Pleura: Mild-to-moderate centrilobular emphysematous changes. Collapse of the right lower lobe. No focal consolidation. No pulmonary nodule. No pulmonary mass. No pneumothorax. Moderate small volume right pleural effusion. No left pleural effusion. Musculoskeletal: No chest wall abnormality. No suspicious lytic or blastic osseous lesions. No acute displaced fracture. Multilevel degenerative changes of the spine. CT ABDOMEN PELVIS FINDINGS Hepatobiliary: Nodular hepatic contour. Heterogeneous appearance of the liver with likely underlying mass lesions. No gallstones, gallbladder wall thickening, or pericholecystic fluid. No biliary dilatation. Pancreas: No focal lesion. Normal pancreatic contour. No surrounding inflammatory changes. No main pancreatic ductal dilatation. Spleen: Normal in size without  focal abnormality. Adrenals/Urinary Tract: No adrenal nodule bilaterally. No nephrolithiasis and no hydronephrosis. No definite contour-deforming renal mass. No ureterolithiasis or hydroureter. The urinary bladder is unremarkable. Stomach/Bowel: Stomach is within normal limits. No evidence of definite bowel wall thickening or dilatation. No pneumatosis identified. Vascular/Lymphatic: No abdominal aorta or iliac aneurysm. Severe atherosclerotic plaque of the aorta and its branches. No gross  abdominal, pelvic, or inguinal lymphadenopathy. Reproductive: The prostate is unremarkable. Other: Large volume ascites. Musculoskeletal: Small volume right inguinal hernia containing ascites. No suspicious lytic or blastic osseous lesions. No acute displaced fracture. Multilevel degenerative changes of the spine. IMPRESSION: 1. Moderate to large volume right pleural effusion. Associated collapse of the right lower lobe. 2. Question cirrhotic morphology of the liver with heterogeneous appearance of the parenchyma suggestive of underlying mass lesions. Concern for malignancy. Recommend nonemergent MRI liver protocol. When the patient is clinically stable and able to follow directions and hold their breath (preferably as an outpatient) further evaluation with dedicated abdominal MRI should be considered. 3. Large volume ascites. 4. Markedly limited evaluation on this noncontrast study. 5. Aortic Atherosclerosis (ICD10-I70.0) and Emphysema (ICD10-J43.9). Electronically Signed   By: Iven Finn M.D.   On: 04/18/2021 19:45   IR Paracentesis  Result Date: 04/19/2021 INDICATION: Patient presented to the ED with a new onset abdominal swelling and diarrhea, found to have large volume ascites and large right pleural effusion. Request to IR for diagnostic and therapeutic paracentesis. EXAM: ULTRASOUND GUIDED DIAGNOSTIC AND THERAPEUTIC PARACENTESIS MEDICATIONS: 8 mL 1% lidocaine COMPLICATIONS: None immediate. PROCEDURE: Informed  written consent was obtained from the patient after a discussion of the risks, benefits and alternatives to treatment. A timeout was performed prior to the initiation of the procedure. Initial ultrasound scanning demonstrates a large amount of ascites within the right lower abdominal quadrant. The right lower abdomen was prepped and draped in the usual sterile fashion. 1% lidocaine was used for local anesthesia. Following this, a 19 gauge, 7-cm, Yueh catheter was introduced. An ultrasound image was saved for documentation purposes. The paracentesis was performed. The catheter was removed and a dressing was applied. The patient tolerated the procedure well without immediate post procedural complication. FINDINGS: A total of approximately 9.0 L of clear, very light yellow fluid was removed. Samples were sent to the laboratory as requested by the clinical team. IMPRESSION: Successful ultrasound-guided paracentesis yielding 9.0 liters of peritoneal fluid. Read by Candiss Norse, PA-C Electronically Signed   By: Miachel Roux M.D.   On: 04/19/2021 14:44   IR THORACENTESIS ASP PLEURAL SPACE W/IMG GUIDE  Result Date: 04/20/2021 INDICATION: Patient with a history of CHF admitted with increased shortness of breath and found to have a large right pleural effusion. Interventional radiology asked to perform a diagnostic and therapeutic thoracentesis. EXAM: ULTRASOUND GUIDED THORACENTESIS MEDICATIONS: 1% lidocaine 10 mL COMPLICATIONS: None immediate. PROCEDURE: An ultrasound guided thoracentesis was thoroughly discussed with the patient and questions answered. The benefits, risks, alternatives and complications were also discussed. The patient understands and wishes to proceed with the procedure. Written consent was obtained. Ultrasound was performed to localize and mark an adequate pocket of fluid in the right chest. The area was then prepped and draped in the normal sterile fashion. 1% Lidocaine was used for local  anesthesia. Under ultrasound guidance a 6 Fr Safe-T-Centesis catheter was introduced. Thoracentesis was performed. The catheter was removed and a dressing applied. FINDINGS: A total of approximately 1.5 L of clear yellow fluid was removed. Samples were sent to the laboratory as requested by the clinical team. IMPRESSION: Successful ultrasound guided right thoracentesis yielding 1.5 L of pleural fluid. Read by: Soyla Dryer, NP Electronically Signed   By: Jacqulynn Cadet M.D.   On: 04/20/2021 12:06     Impression / Plan:   Impression: 1.  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  CKD stage III 3.  Chronic obstructive lung disease: On 2 L O2 chronically with acute respiratory failure and large right-sided pleural effusion, thoracentesis 10/25 with 1.5 L removed 4.  Abnormal CT liver: With question of underlying malignancy, AFP elevated at 2957, CA 19-02/26/1976 5.  Unintended weight loss: 6.  A. fib: Currently on heparin infusion  7.  CHF 8.  Diarrhea: C. difficile negative, GI pathogen panel negative, sounds like it is just twice a day per the patient; consider relation underlying malignancy versus other  Plan: 1.  Patient would benefit from some sort of imaging of his liver to better ascertain if he has [place].  In discussion with Dr. Candis Schatz today patient told him that he could not "go in the MRI machine".  Apparently has anxiety.  Discussed medications for this but the patient still did not feel like he would be able to lay there.  I have reached out to radiology to see if a triphasic CT would be helpful in the diagnosis of [place], waiting for their return call. 2.  Continue other supportive measures 3.  As far as diarrhea does not appear infectious, could try Imodium as needed to see if this is helpful  Thank you for your  kind consultation, we will continue to follow.  Lavone Nian Kamille Toomey  04/20/2021, 3:50 PM

## 2021-04-20 NOTE — Progress Notes (Signed)
ANTICOAGULATION CONSULT NOTE   Pharmacy Consult for Heparin Indication: atrial fibrillation  Allergies  Allergen Reactions   Levaquin [Levofloxacin In D5w]     Generalized redness; received azithromycin, rocephin, and levaquin on the same day as reaction developed.    Patient Measurements: Height: 5\' 11"  (180.3 cm) Weight: 71.2 kg (157 lb) IBW/kg (Calculated) : 75.3 Heparin Dosing Weight: 71 kg  Vital Signs: BP: 84/63 (10/25 0645) Pulse Rate: 63 (10/25 0645)  Labs: Recent Labs    04/18/21 1558 04/19/21 0620 04/19/21 0807 04/20/21 0428  HGB 13.4 10.9*  --  12.0*  HCT 41.5 34.1*  --  36.9*  PLT 171 130*  --  126*  LABPROT 13.8  --   --   --   INR 1.1  --   --   --   HEPARINUNFRC  --   --  0.50 0.68  CREATININE 1.86* 1.74*  --  1.83*     Estimated Creatinine Clearance: 37.3 mL/min (A) (by C-G formula based on SCr of 1.83 mg/dL (H)).   Medical History: Past Medical History:  Diagnosis Date   Anxiety    CHF (congestive heart failure) (HCC)    GERD (gastroesophageal reflux disease)    Heavy smoker    Shortness of breath dyspnea    Stroke (HCC)    a. Noted on CT 07/29/14    Medications:  (Not in a hospital admission) Scheduled:   atorvastatin  40 mg Oral Daily   fluticasone furoate-vilanterol  1 puff Inhalation Daily   sodium chloride flush  3 mL Intravenous Q12H   traZODone  50 mg Oral QHS   Infusions:   sodium chloride     albumin human     heparin 1,100 Units/hr (04/19/21 1858)   PRN: sodium chloride, ipratropium-albuterol, lidocaine (PF), sodium chloride flush  Assessment: 70 yom with a history of anxiety, HF, GERD, smoker, SOB, stroke, HLD. Patient is presenting with SOB. Heparin per pharmacy consult placed for atrial fibrillation.  Heparin level this AM remains therapeutic, drifting up to upper end on 1100 units/hr  Goal of Therapy:  Heparin level 0.3-0.7 units/ml Monitor platelets by anticoagulation protocol: Yes   Plan:  Slight decrease in  heparin gtt to 1050 units/hr Daily heparin level, CBC, s/s bleeding F/u IR plans, long term [facility] plans and ability to transition to PO  [facility], PharmD Clinical Pharmacist ED Pharmacist Phone # [phone] 04/20/2021 7:23 AM

## 2021-04-20 NOTE — Progress Notes (Signed)
Assumed care of patient. Echo being completed at bedside.

## 2021-04-20 NOTE — Progress Notes (Signed)
Cross-coverage note:   Patient seen for increased SOB.   He was admitted night of 10/23 with large rt pleural effusion and massive ascites, had paracentesis yesterday stopped after 9 L d/t hypotension.   This morning, he was asleep on his usual 2 Lpm O2 when he rolled on his side and woke with acute dyspnea. He was sat up and placed on 15 Lpm via NRB.   He recovered quickly and now speaking full sentences with normal RR, sat mid-90s on 5 Lpm, and states that he feels better now.   CXR repeated and demonstrates progression of right-sided airspace opacity, now with complete opacification of the hemithorax.   Plan to consult IR for thoracentesis, continue close monitoring and supportive care.

## 2021-04-20 NOTE — Procedures (Signed)
PROCEDURE SUMMARY:  Successful US guided right thoracentesis. Yielded 1.5 L of clear yellow fluid. Pt tolerated procedure well. No immediate complications.  Specimen sent for labs. CXR ordered; no post-procedure pneumothorax identified  EBL < 2 mL  Mickie Kay, NP 04/20/2021 11:52 AM

## 2021-04-20 NOTE — Progress Notes (Signed)
Patient states that he is having 7/10 back pain. MD text paged.

## 2021-04-20 NOTE — Plan of Care (Signed)
  Problem: Education: Goal: Knowledge of General Education information will improve Description Including pain rating scale, medication(s)/side effects and non-pharmacologic comfort measures Outcome: Progressing   

## 2021-04-20 NOTE — Plan of Care (Signed)

## 2021-04-21 ENCOUNTER — Other Ambulatory Visit (HOSPITAL_COMMUNITY): Payer: Self-pay

## 2021-04-21 ENCOUNTER — Inpatient Hospital Stay (HOSPITAL_COMMUNITY): Payer: Medicare HMO

## 2021-04-21 DIAGNOSIS — I480 Paroxysmal atrial fibrillation: Secondary | ICD-10-CM

## 2021-04-21 DIAGNOSIS — R932 Abnormal findings on diagnostic imaging of liver and biliary tract: Secondary | ICD-10-CM | POA: Diagnosis not present

## 2021-04-21 DIAGNOSIS — K7031 Alcoholic cirrhosis of liver with ascites: Secondary | ICD-10-CM | POA: Diagnosis not present

## 2021-04-21 DIAGNOSIS — D696 Thrombocytopenia, unspecified: Secondary | ICD-10-CM

## 2021-04-21 DIAGNOSIS — J9621 Acute and chronic respiratory failure with hypoxia: Secondary | ICD-10-CM | POA: Diagnosis not present

## 2021-04-21 DIAGNOSIS — I350 Nonrheumatic aortic (valve) stenosis: Secondary | ICD-10-CM

## 2021-04-21 DIAGNOSIS — R531 Weakness: Secondary | ICD-10-CM

## 2021-04-21 DIAGNOSIS — I5043 Acute on chronic combined systolic (congestive) and diastolic (congestive) heart failure: Secondary | ICD-10-CM

## 2021-04-21 DIAGNOSIS — R634 Abnormal weight loss: Secondary | ICD-10-CM

## 2021-04-21 DIAGNOSIS — R188 Other ascites: Secondary | ICD-10-CM | POA: Diagnosis not present

## 2021-04-21 DIAGNOSIS — J90 Pleural effusion, not elsewhere classified: Secondary | ICD-10-CM | POA: Diagnosis not present

## 2021-04-21 DIAGNOSIS — N183 Chronic kidney disease, stage 3 unspecified: Secondary | ICD-10-CM | POA: Diagnosis not present

## 2021-04-21 DIAGNOSIS — Z7189 Other specified counseling: Secondary | ICD-10-CM

## 2021-04-21 DIAGNOSIS — E43 Unspecified severe protein-calorie malnutrition: Secondary | ICD-10-CM

## 2021-04-21 DIAGNOSIS — D649 Anemia, unspecified: Secondary | ICD-10-CM

## 2021-04-21 LAB — RENAL FUNCTION PANEL
Albumin: 2.4 g/dL — ABNORMAL LOW (ref 3.5–5.0)
Anion gap: 8 (ref 5–15)
BUN: 49 mg/dL — ABNORMAL HIGH (ref 8–23)
CO2: 23 mmol/L (ref 22–32)
Calcium: 8.4 mg/dL — ABNORMAL LOW (ref 8.9–10.3)
Chloride: 105 mmol/L (ref 98–111)
Creatinine, Ser: 1.94 mg/dL — ABNORMAL HIGH (ref 0.61–1.24)
GFR, Estimated: 36 mL/min — ABNORMAL LOW (ref >60)
Glucose, Bld: 109 mg/dL — ABNORMAL HIGH (ref 70–99)
Phosphorus: 3.7 mg/dL (ref 2.5–4.6)
Potassium: 4.4 mmol/L (ref 3.5–5.1)
Sodium: 136 mmol/L (ref 135–145)

## 2021-04-21 LAB — HEPARIN LEVEL (UNFRACTIONATED): Heparin Unfractionated: 0.61 IU/mL (ref 0.30–0.70)

## 2021-04-21 LAB — CBC
HCT: 33.6 % — ABNORMAL LOW (ref 39.0–52.0)
Hemoglobin: 11 g/dL — ABNORMAL LOW (ref 13.0–17.0)
MCH: 31.1 pg (ref 26.0–34.0)
MCHC: 32.7 g/dL (ref 30.0–36.0)
MCV: 94.9 fL (ref 80.0–100.0)
Platelets: UNDETERMINED 10*3/uL (ref 150–400)
RBC: 3.54 MIL/uL — ABNORMAL LOW (ref 4.22–5.81)
RDW: 16.1 % — ABNORMAL HIGH (ref 11.5–15.5)
WBC: 6.4 10*3/uL (ref 4.0–10.5)
nRBC: 0 % (ref 0.0–0.2)

## 2021-04-21 LAB — CYTOLOGY - NON PAP

## 2021-04-21 LAB — MAGNESIUM: Magnesium: 2 mg/dL (ref 1.7–2.4)

## 2021-04-21 MED ORDER — BOOST / RESOURCE BREEZE PO LIQD CUSTOM
1.0000 | Freq: Three times a day (TID) | ORAL | Status: DC
  Administered 2021-04-21 – 2021-04-22 (×2): 1 via ORAL

## 2021-04-21 MED ORDER — IOHEXOL 350 MG/ML SOLN
175.0000 mL | Freq: Once | INTRAVENOUS | Status: AC | PRN
  Administered 2021-04-21: 175 mL via INTRAVENOUS

## 2021-04-21 MED ORDER — LOPERAMIDE HCL 2 MG PO CAPS
2.0000 mg | ORAL_CAPSULE | ORAL | Status: DC | PRN
  Administered 2021-04-21 (×2): 2 mg via ORAL
  Filled 2021-04-21 (×2): qty 1

## 2021-04-21 MED ORDER — ADULT MULTIVITAMIN W/MINERALS CH
1.0000 | ORAL_TABLET | Freq: Every day | ORAL | Status: DC
  Administered 2021-04-21 – 2021-04-22 (×2): 1 via ORAL
  Filled 2021-04-21 (×2): qty 1

## 2021-04-21 NOTE — TOC Benefit Eligibility Note (Signed)
Patient Product/process development scientist completed.    The patient is currently admitted and upon discharge could be taking Warfarin 5 mg.  The current 30 day co-pay is, $3.00.   The patient is currently admitted and upon discharge could be taking Eliquis 5 mg.  The current 30 day co-pay is, $47.00.   The patient is currently admitted and upon discharge could be taking Eliquis 2.5 mg.  The current 30 day co-pay is, $47.00.   The patient is currently admitted and upon discharge could be taking Xarelto 15 mg.  The current 30 day co-pay is, $47.00.   The patient is insured through [employer] Part D     Roland Earl, CPhT Pharmacy Patient Advocate Specialist [facility] Patient Advocate Team Direct Number: [phone]  Fax: [phone]

## 2021-04-21 NOTE — Consult Note (Signed)
Cardiology Consultation:   Patient ID: [name] [name]. MRN: [id number]; DOB: 04-Jan-1951  Admit date: 04/18/2021 Date of Consult: 04/21/2021  PCP:  [name] Matar, MD   [facility] Providers Cardiologist: New    Patient Profile:   [name] [name]. is a 70 y.o. male with a hx of chronic systolic heart failure, stroke, hypertension, COPD with tobacco smoker, PAF, CKD and heavy alcohol abuse who is being seen 04/21/2021 for the evaluation of CHF and AS at the request of Dr. Alanda Slim.  Seen by cardiology service in 2016  when admitted for moderate to severe cardiomyopathy (EF was 30-35%) and risk factor for CAD.  He was noted to have coronary calcification on CTA.  Unable to underwent cardiac catheterization due to ongoing encephalopathy.  Echo 08/2015 showed improved LVEF to 45-50% and grade 1 DD. Mild AS.   History of Present Illness:   [name] [name] presented 04/18/2021 with abdominal swelling, weight loss, poor p.o. intake and diarrhea.  He was admitted for acute on chronic respiratory failure with hypoxia in setting of right pleural effusion, finding of liver cirrhosis with anxiety and AKI.  He was noted to have large right-sided pleural effusion with right lower lobe collapse s/p ultrasound-guided thoracentesis yielding 1.5 L of transudative fluid.  Also noted to have possible liver cirrhosis with ascites.  He underwent paracentesis with 9 L of fluid removal.  There was a concern for HCC given markedly elevated AFP and large liver lesion.  He underwent triphasic CT scan of abdomen this afternoon, pending reading.  Unable to perform MRI.  SCr 1.74>>1.83>>1.94  (baseline around 1.7-1.8) Given IV lasix 40mg  on 11/23 & 11/24 long with fluids.  On heparin for anticoagulation  Reports improved breathing since admit. Bisoprolol stopped due to soft blood pressure.   Echocardiogram is abnormal as below and cardiology is asked for further evaluation.  1. Suspect severe low flow low gradient  aortic stenosis is present.  Visually, the valve is severely calcified with restricted leaflet motion  in systole. Vmax 2.5 m/s, MG 17 mmHG, AVA 0.68, DI 0.20. The CW jet is  rounded with prolonged AT >100 msec.  Would recommend dobutamine stress echo vs calcium score for clarification.  The aortic valve is tricuspid. There is severe calcifcation of the aortic  valve. There is severe thickening of the aortic valve. Aortic valve  regurgitation is trivial. Severe  aortic valve stenosis.   2. Left ventricular ejection fraction, by estimation, is 35 to 40%. Left  ventricular ejection fraction by 2D MOD biplane is 37.7 %. The left  ventricle has moderately decreased function. The left ventricle  demonstrates global hypokinesis. Left  ventricular diastolic parameters are consistent with Grade II diastolic  dysfunction (pseudonormalization).   3. Right ventricular systolic function is mildly reduced. The right  ventricular size is mildly enlarged. Tricuspid regurgitation signal is  inadequate for assessing PA pressure.   4. The mitral valve is degenerative. Mild mitral valve regurgitation. No  evidence of mitral stenosis.   5. The inferior vena cava is normal in size with <50% respiratory  variability, suggesting right atrial pressure of 8 mmHg.    Past Medical History:  Diagnosis Date   Anxiety    CHF (congestive heart failure) (HCC)    GERD (gastroesophageal reflux disease)    Heavy smoker    Shortness of breath dyspnea    Stroke (HCC)    a. Noted on CT 07/29/14    Past Surgical History:  Procedure Laterality Date  IR PARACENTESIS  04/19/2021   IR THORACENTESIS ASP PLEURAL SPACE W/IMG GUIDE  04/20/2021   LEFT HEART CATHETERIZATION WITH CORONARY ANGIOGRAM N/A 07/28/2014   Procedure: LEFT HEART CATHETERIZATION WITH CORONARY ANGIOGRAM;  Surgeon: [name] Noe, MD;  Location: [facility] CATH LAB;  Service: Cardiovascular;  Laterality: N/A;   LEG SURGERY     SPINE SURGERY       Inpatient  Medications: Scheduled Meds:  atorvastatin  40 mg Oral Daily   feeding supplement  1 Container Oral TID BM   fluticasone furoate-vilanterol  1 puff Inhalation Daily   influenza vaccine adjuvanted  0.5 mL Intramuscular Tomorrow-1000   multivitamin with minerals  1 tablet Oral Daily   sodium chloride flush  3 mL Intravenous Q12H   traZODone  50 mg Oral QHS   Continuous Infusions:  sodium chloride     heparin 1,050 Units/hr (04/21/21 0927)   PRN Meds: sodium chloride, ipratropium-albuterol, lidocaine (PF), loperamide, oxyCODONE-acetaminophen, sodium chloride flush  Allergies:    Allergies  Allergen Reactions   Levaquin [Levofloxacin In D5w]     Generalized redness; received azithromycin, rocephin, and levaquin on the same day as reaction developed.    Social History:   Social History   Socioeconomic History   Marital status: Single    Spouse name: Not on file   Number of children: Not on file   Years of education: Not on file   Highest education level: Not on file  Occupational History   Not on file  Tobacco Use   Smoking status: Former    Types: Cigarettes    Quit date: 07/22/2014    Years since quitting: 6.7   Smokeless tobacco: Never  Vaping Use   Vaping Use: Never used  Substance and Sexual Activity   Alcohol use: Yes    Alcohol/week: 175.0 standard drinks    Types: 175 Cans of beer per week   Drug use: No   Sexual activity: Not Currently  Other Topics Concern   Not on file  Social History Narrative   Not on file   Social Determinants of Health   Financial Resource Strain: Not on file  Food Insecurity: Not on file  Transportation Needs: Not on file  Physical Activity: Not on file  Stress: Not on file  Social Connections: Not on file  Intimate Partner Violence: Not on file    Family History:   History reviewed. No pertinent family history.  No family history of CAD  ROS:  Please see the history of present illness.  All other ROS reviewed and  negative.     Physical Exam/Data:   Vitals:   04/21/21 1142 04/21/21 1400 04/21/21 1454 04/21/21 1541  BP: (!) 82/62   92/62  Pulse: 70   81  Resp: 17 18 18 20   Temp: (!) 97.5 F (36.4 C)   97.7 F (36.5 C)  TempSrc: Oral   Oral  SpO2: 100%   96%  Weight:      Height:        Intake/Output Summary (Last 24 hours) at 04/21/2021 1725 Last data filed at 04/21/2021 9147 Gross per 24 hour  Intake 892.61 ml  Output --  Net 892.61 ml   Last 3 Weights 04/21/2021 04/19/2021 12/10/2020  Weight (lbs) 133 lb 2.5 oz 157 lb 156 lb 6.4 oz  Weight (kg) 60.4 kg 71.215 kg 70.943 kg     Body mass index is 18.57 kg/m.  General: Thin frail ill-appearing elderly male  HEENT: normal Neck: no  JVD Vascular: No carotid bruits; Distal pulses 2+ bilaterally Cardiac:  normal S1, S2; RRR; 3/6 systolic murmur  Lungs:  clear to auscultation bilaterally, no wheezing, rhonchi or rales  Abd: soft, nontender, no hepatomegaly  Ext: no edema Musculoskeletal:  No deformities, BUE and BLE strength normal and equal Skin: warm and dry  Neuro:  CNs 2-12 intact, no focal abnormalities noted Psych:  Normal affect   EKG:  The EKG was personally reviewed and demonstrates: Atrial fibrillation, right bundle branch block Telemetry:  Telemetry was personally reviewed and demonstrates: Atrial fibrillation  Relevant CV Studies: As summarized above  Laboratory Data:  High Sensitivity Troponin:  No results for input(s): TROPONINIHS in the last 720 hours.   Chemistry Recent Labs  Lab 04/18/21 1558 04/19/21 0620 04/20/21 0428 04/21/21 0334  NA 136 136 135 136  K 4.5 4.5 4.6 4.4  CL 104 106 105 105  CO2 20* 19* 20* 23  GLUCOSE 83 85 107* 109*  BUN 44* 43* 44* 49*  CREATININE 1.86* 1.74* 1.83* 1.94*  CALCIUM 9.2 8.2* 8.4* 8.4*  MG 2.1  --   --  2.0  GFRNONAA 38* 41* 39* 36*  ANIONGAP Recent Labs  Lab 04/18/21 1558 04/19/21 0620 04/20/21 0428 04/21/21 0334  PROT 7.2 5.9* 6.1*  --    ALBUMIN 2.8* 2.2* 2.1* 2.4*  AST 50* 45* 44*  --   ALT --   ALKPHOS 171* 135* 142*  --   BILITOT 1.1 1.1 0.9  --    Lipids No results for input(s): CHOL, TRIG, HDL, LABVLDL, LDLCALC, CHOLHDL in the last 168 hours.  Hematology Recent Labs  Lab 04/19/21 0620 04/20/21 0428 04/21/21 0334  WBC 7.2 6.6 6.4  RBC 3.47* 3.82* 3.54*  HGB 10.9* 12.0* 11.0*  HCT 34.1* 36.9* 33.6*  MCV 98.3 96.6 94.9  MCH 31.4 31.4 31.1  MCHC 32.0 32.5 32.7  RDW 16.2* 16.2* 16.1*  PLT 130* 126* PLATELET CLUMPS NOTED ON SMEAR, UNABLE TO ESTIMATE    Radiology/Studies:  DG Chest 1 View  Result Date: 04/20/2021 CLINICAL DATA:  Pleural effusion. Additional history provided: Status post right thoracentesis. EXAM: CHEST  1 VIEW COMPARISON:  Radiographs 04/20/2021 and earlier. CT chest 04/18/2021. Prior chest FINDINGS: Mild cardiomegaly, unchanged. There has been interval right thoracentesis. There is a small residual right pleural effusion. Associated right basilar atelectasis. The left lung is clear. No evidence of pneumothorax. No acute bony abnormality identified. IMPRESSION: Interval right thoracentesis with small residual right pleural effusion. Associated right basilar atelectasis. Pneumonia at the right lung base cannot be excluded and clinical correlation is recommended. No evidence of pneumothorax. Mild cardiomegaly, unchanged. Electronically Signed   By: Jackey Loge D.O.   On: 04/20/2021 11:39   DG CHEST PORT 1 VIEW  Result Date: 04/20/2021 CLINICAL DATA:  Respiratory distress. EXAM: PORTABLE CHEST 1 VIEW COMPARISON:  Chest radiograph and CT dated 04/18/2021. FINDINGS: Complete opacification of the right hemithorax, progressed since the prior studies. There is deviation of the trachea to the right the midline. The left lung is clear. No pneumothorax. No acute osseous pathology. IMPRESSION: Complete opacification of the right hemithorax, progressed since the prior studies. Electronically Signed    By: Elgie Collard M.D.   On: 04/20/2021 02:36   DG Chest Port 1 View  Result Date: 04/18/2021 CLINICAL DATA:  Short of breath EXAM: PORTABLE CHEST 1 VIEW COMPARISON:  Chest x-ray 07/30/2014 FINDINGS: Persistently enlarged cardiac silhouette. The heart  and mediastinal contours are unchanged. Aortic calcification. Right mid lower lung zone airspace opacity. No pulmonary edema. Interval increase in size of an at least small right pleural effusion. No pneumothorax. No acute osseous abnormality. IMPRESSION: 1. Interval increase in size of an at least small right pleural effusion. 2. Right mid lower lung zone airspace opacity that could represent atelectasis versus infection/inflammation. Followup PA and lateral chest X-ray is recommended in 3-4 weeks following therapy to ensure resolution and exclude underlying malignancy. Electronically Signed   By: Tish Frederickson M.D.   On: 04/18/2021 16:08   ECHOCARDIOGRAM COMPLETE  Result Date: 04/20/2021    ECHOCARDIOGRAM REPORT   Patient Name:   [name] Dastrup. Date of Exam: 04/20/2021 Medical Rec #:  [id number]        Height:       71.0 in Accession #:    [id number]       Weight:       157.0 lb Date of Birth:  [birth year]        BSA:          1.902 m Patient Age:    70 years         BP:           100/58 mmHg Patient Gender: M                HR:           70 bpm. Exam Location:  Inpatient Procedure: 2D Echo, Color Doppler and Cardiac Doppler Indications:    Dyspnea  History:        Patient has no prior history of Echocardiogram examinations.                 CHF, Arrythmias:Atrial Fibrillation, Signs/Symptoms:Shortness of                 Breath; Risk Factors:Hypertension and Dyslipidemia.  Sonographer:    Cleatis Polka Referring Phys: [id number] RIPUDEEP K RAI IMPRESSIONS  1. Suspect severe low flow low gradient aortic stenosis is present. Visually, the valve is severely calcified with restricted leaflet motion in systole. Vmax 2.5 m/s, MG 17 mmHG, AVA 0.68, DI 0.20. The CW  jet is rounded with prolonged AT >100 msec. Would recommend dobutamine stress echo vs calcium score for clarification. The aortic valve is tricuspid. There is severe calcifcation of the aortic valve. There is severe thickening of the aortic valve. Aortic valve regurgitation is trivial. Severe aortic valve stenosis.  2. Left ventricular ejection fraction, by estimation, is 35 to 40%. Left ventricular ejection fraction by 2D MOD biplane is 37.7 %. The left ventricle has moderately decreased function. The left ventricle demonstrates global hypokinesis. Left ventricular diastolic parameters are consistent with Grade II diastolic dysfunction (pseudonormalization).  3. Right ventricular systolic function is mildly reduced. The right ventricular size is mildly enlarged. Tricuspid regurgitation signal is inadequate for assessing PA pressure.  4. The mitral valve is degenerative. Mild mitral valve regurgitation. No evidence of mitral stenosis.  5. The inferior vena cava is normal in size with <50% respiratory variability, suggesting right atrial pressure of 8 mmHg. FINDINGS  Left Ventricle: Left ventricular ejection fraction, by estimation, is 35 to 40%. Left ventricular ejection fraction by 2D MOD biplane is 37.7 %. The left ventricle has moderately decreased function. The left ventricle demonstrates global hypokinesis. The left ventricular internal cavity size was normal in size. There is no left ventricular hypertrophy. Left ventricular diastolic parameters are consistent with Grade II diastolic dysfunction (pseudonormalization).  Right Ventricle: The right ventricular size is mildly enlarged. No increase in right ventricular wall thickness. Right ventricular systolic function is mildly reduced. Tricuspid regurgitation signal is inadequate for assessing PA pressure. Left Atrium: Left atrial size was normal in size. Right Atrium: Right atrial size was normal in size. Pericardium: There is no evidence of pericardial  effusion. Mitral Valve: The mitral valve is degenerative in appearance. There is mild calcification of the anterior mitral valve leaflet(s). Mild mitral valve regurgitation. No evidence of mitral valve stenosis. Tricuspid Valve: The tricuspid valve is grossly normal. Tricuspid valve regurgitation is trivial. Aortic Valve: Suspect severe low flow low gradient aortic stenosis is present. Visually, the valve is severely calcified with restricted leaflet motion in systole. Vmax 2.5 m/s, MG 17 mmHG, AVA 0.68, DI 0.20. The CW jet is rounded with prolonged AT >100 msec. Would recommend dobutamine stress echo vs calcium score for clarification. The aortic valve is tricuspid. There is severe calcifcation of the aortic valve. There is severe thickening of the aortic valve. Aortic valve regurgitation is trivial. Severe aortic stenosis is present. Aortic valve mean gradient measures 17.0 mmHg. Aortic valve peak gradient measures 24.6 mmHg. Aortic valve area, by VTI measures 0.68 cm. Pulmonic Valve: The pulmonic valve was grossly normal. Pulmonic valve regurgitation is trivial. No evidence of pulmonic stenosis. Aorta: The aortic root and ascending aorta are structurally normal, with no evidence of dilitation. Venous: The inferior vena cava is normal in size with less than 50% respiratory variability, suggesting right atrial pressure of 8 mmHg. IAS/Shunts: The atrial septum is grossly normal. Additional Comments: There is a small pleural effusion in the left lateral region. Mild ascites is present.  LEFT VENTRICLE PLAX 2D                        Biplane EF (MOD) LVIDd:         5.60 cm         LV Biplane EF:   Left LVIDs:         4.80 cm                          ventricular LV PW:         1.00 cm                          ejection LV IVS:        0.90 cm                          fraction by LVOT diam:     2.10 cm                          2D MOD LV SV:         41                               biplane is LV SV Index:   21                                37.7 %. LVOT Area:     3.46 cm  Diastology                                LV e' medial:    3.24 cm/s LV Volumes (MOD)               LV E/e' medial:  16.0 LV vol d, MOD    109.0 ml      LV e' lateral:   6.31 cm/s A2C:                           LV E/e' lateral: 8.2 LV vol d, MOD    156.0 ml A4C: LV vol s, MOD    64.6 ml A2C: LV vol s, MOD    96.4 ml A4C: LV SV MOD A2C:   44.4 ml LV SV MOD A4C:   156.0 ml LV SV MOD BP:    52.0 ml RIGHT VENTRICLE            IVC RV Basal diam:  4.10 cm    IVC diam: 1.60 cm RV S prime:     7.27 cm/s TAPSE (M-mode): 2.3 cm LEFT ATRIUM             Index        RIGHT ATRIUM           Index LA diam:        5.20 cm 2.73 cm/m   RA Area:     16.20 cm LA Vol (A2C):   57.1 ml 30.01 ml/m  RA Volume:   40.80 ml  21.45 ml/m LA Vol (A4C):   57.3 ml 30.12 ml/m LA Biplane Vol: 58.4 ml 30.70 ml/m  AORTIC VALVE AV Area (Vmax):    0.72 cm AV Area (Vmean):   0.70 cm AV Area (VTI):     0.68 cm AV Vmax:           248.00 cm/s AV Vmean:          188.000 cm/s AV VTI:            0.599 m AV Peak Grad:      24.6 mmHg AV Mean Grad:      17.0 mmHg LVOT Vmax:         51.70 cm/s LVOT Vmean:        38.200 cm/s LVOT VTI:          0.118 m LVOT/AV VTI ratio: 0.20  AORTA Ao Root diam: 3.40 cm Ao Asc diam:  3.80 cm MITRAL VALVE MV Area (PHT): 4.33 cm    SHUNTS MV Decel Time: 175 msec    Systemic VTI:  0.12 m MR Peak grad: 57.5 mmHg    Systemic Diam: 2.10 cm MR Vmax:      379.00 cm/s MV E velocity: 51.80 cm/s MV A velocity: 48.40 cm/s MV E/A ratio:  1.07 Lennie Odor MD Electronically signed by Lennie Odor MD Signature Date/Time: 04/20/2021/5:58:50 PM    Final    VAS [place] LOWER EXTREMITY VENOUS (DVT) (7a-7p)  Result Date: 04/18/2021  Lower Venous DVT Study Patient Name:  [name] [name].  Date of Exam:   04/18/2021 Medical Rec #: [id number]         Accession #:    [id number] Date of Birth: [birth year]         Patient Gender: M Patient Age:   32 years Exam Location:   [facility]  Procedure:      VAS [place] LOWER EXTREMITY VENOUS (DVT) Referring Phys: JULIE HAVILAND --------------------------------------------------------------------------------  Indications: Swelling.  Risk Factors: None identified. Limitations: Poor ultrasound/tissue interface. Comparison Study: No prior studies. Performing Technologist: Chanda Busing RVT  Examination Guidelines: A complete evaluation includes B-mode imaging, spectral Doppler, color Doppler, and power Doppler as needed of all accessible portions of each vessel. Bilateral testing is considered an integral part of a complete examination. Limited examinations for reoccurring indications may be performed as noted. The reflux portion of the exam is performed with the patient in reverse Trendelenburg.  +---------+---------------+---------+-----------+----------+--------------+ RIGHT    CompressibilityPhasicitySpontaneityPropertiesThrombus Aging +---------+---------------+---------+-----------+----------+--------------+ CFV      Full           Yes      Yes                                 +---------+---------------+---------+-----------+----------+--------------+ SFJ      Full                                                        +---------+---------------+---------+-----------+----------+--------------+ FV Prox  Full                                                        +---------+---------------+---------+-----------+----------+--------------+ FV Mid   Full                                                        +---------+---------------+---------+-----------+----------+--------------+ FV Distal               Yes      Yes                                 +---------+---------------+---------+-----------+----------+--------------+ PFV      Full                                                        +---------+---------------+---------+-----------+----------+--------------+ POP      Full            Yes      Yes                                 +---------+---------------+---------+-----------+----------+--------------+ PTV      Full                                                        +---------+---------------+---------+-----------+----------+--------------+ PERO     Full                                                        +---------+---------------+---------+-----------+----------+--------------+   +----+---------------+---------+-----------+----------+--------------+  LEFTCompressibilityPhasicitySpontaneityPropertiesThrombus Aging +----+---------------+---------+-----------+----------+--------------+ CFV Full           Yes      Yes                                 +----+---------------+---------+-----------+----------+--------------+     Summary: RIGHT: - There is no evidence of deep vein thrombosis in the lower extremity. However, portions of this examination were limited- see technologist comments above.  - No cystic structure found in the popliteal fossa.  LEFT: - No evidence of common femoral vein obstruction.  *See table(s) above for measurements and observations. Electronically signed by Waverly Ferrari MD on 04/18/2021 at 5:44:35 PM.    Final    CT CHEST ABDOMEN PELVIS WO CONTRAST  Result Date: 04/18/2021 CLINICAL DATA:  Pleural effusion.  Shortness of breath. EXAM: CT CHEST, ABDOMEN AND PELVIS WITHOUT CONTRAST TECHNIQUE: Multidetector CT imaging of the chest, abdomen and pelvis was performed following the standard protocol without IV contrast. COMPARISON:  Chest x-ray 04/18/2021 FINDINGS: CT CHEST FINDINGS Cardiovascular: Normal heart size. No significant pericardial effusion. The thoracic aorta is normal in caliber. Severe atherosclerotic plaque of the thoracic aorta. Three-vessel coronary artery calcifications. Mediastinum/Nodes: No gross hilar adenopathy, noting limited sensitivity for the detection of hilar adenopathy on this noncontrast study. No  enlarged mediastinal or axillary lymph nodes. Thyroid gland, trachea, and esophagus demonstrate no significant findings. Lungs/Pleura: Mild-to-moderate centrilobular emphysematous changes. Collapse of the right lower lobe. No focal consolidation. No pulmonary nodule. No pulmonary mass. No pneumothorax. Moderate small volume right pleural effusion. No left pleural effusion. Musculoskeletal: No chest wall abnormality. No suspicious lytic or blastic osseous lesions. No acute displaced fracture. Multilevel degenerative changes of the spine. CT ABDOMEN PELVIS FINDINGS Hepatobiliary: Nodular hepatic contour. Heterogeneous appearance of the liver with likely underlying mass lesions. No gallstones, gallbladder wall thickening, or pericholecystic fluid. No biliary dilatation. Pancreas: No focal lesion. Normal pancreatic contour. No surrounding inflammatory changes. No main pancreatic ductal dilatation. Spleen: Normal in size without focal abnormality. Adrenals/Urinary Tract: No adrenal nodule bilaterally. No nephrolithiasis and no hydronephrosis. No definite contour-deforming renal mass. No ureterolithiasis or hydroureter. The urinary bladder is unremarkable. Stomach/Bowel: Stomach is within normal limits. No evidence of definite bowel wall thickening or dilatation. No pneumatosis identified. Vascular/Lymphatic: No abdominal aorta or iliac aneurysm. Severe atherosclerotic plaque of the aorta and its branches. No gross abdominal, pelvic, or inguinal lymphadenopathy. Reproductive: The prostate is unremarkable. Other: Large volume ascites. Musculoskeletal: Small volume right inguinal hernia containing ascites. No suspicious lytic or blastic osseous lesions. No acute displaced fracture. Multilevel degenerative changes of the spine. IMPRESSION: 1. Moderate to large volume right pleural effusion. Associated collapse of the right lower lobe. 2. Question cirrhotic morphology of the liver with heterogeneous appearance of the  parenchyma suggestive of underlying mass lesions. Concern for malignancy. Recommend nonemergent MRI liver protocol. When the patient is clinically stable and able to follow directions and hold their breath (preferably as an outpatient) further evaluation with dedicated abdominal MRI should be considered. 3. Large volume ascites. 4. Markedly limited evaluation on this noncontrast study. 5. Aortic Atherosclerosis (ICD10-I70.0) and Emphysema (ICD10-J43.9). Electronically Signed   By: Tish Frederickson M.D.   On: 04/18/2021 19:45   IR Paracentesis  Result Date: 04/19/2021 INDICATION: Patient presented to the ED with a new onset abdominal swelling and diarrhea, found to have large volume ascites and large right pleural effusion. Request to IR for diagnostic and therapeutic paracentesis. EXAM: ULTRASOUND  GUIDED DIAGNOSTIC AND THERAPEUTIC PARACENTESIS MEDICATIONS: 8 mL 1% lidocaine COMPLICATIONS: None immediate. PROCEDURE: Informed written consent was obtained from the patient after a discussion of the risks, benefits and alternatives to treatment. A timeout was performed prior to the initiation of the procedure. Initial ultrasound scanning demonstrates a large amount of ascites within the right lower abdominal quadrant. The right lower abdomen was prepped and draped in the usual sterile fashion. 1% lidocaine was used for local anesthesia. Following this, a 19 gauge, 7-cm, Yueh catheter was introduced. An ultrasound image was saved for documentation purposes. The paracentesis was performed. The catheter was removed and a dressing was applied. The patient tolerated the procedure well without immediate post procedural complication. FINDINGS: A total of approximately 9.0 L of clear, very light yellow fluid was removed. Samples were sent to the laboratory as requested by the clinical team. IMPRESSION: Successful ultrasound-guided paracentesis yielding 9.0 liters of peritoneal fluid. Read by Lynnette Caffey, PA-C  Electronically Signed   By: Acquanetta Belling M.D.   On: 04/19/2021 14:44   IR THORACENTESIS ASP PLEURAL SPACE W/IMG GUIDE  Result Date: 04/20/2021 INDICATION: Patient with a history of CHF admitted with increased shortness of breath and found to have a large right pleural effusion. Interventional radiology asked to perform a diagnostic and therapeutic thoracentesis. EXAM: ULTRASOUND GUIDED THORACENTESIS MEDICATIONS: 1% lidocaine 10 mL COMPLICATIONS: None immediate. PROCEDURE: An ultrasound guided thoracentesis was thoroughly discussed with the patient and questions answered. The benefits, risks, alternatives and complications were also discussed. The patient understands and wishes to proceed with the procedure. Written consent was obtained. Ultrasound was performed to localize and mark an adequate pocket of fluid in the right chest. The area was then prepped and draped in the normal sterile fashion. 1% Lidocaine was used for local anesthesia. Under ultrasound guidance a 6 Fr Safe-T-Centesis catheter was introduced. Thoracentesis was performed. The catheter was removed and a dressing applied. FINDINGS: A total of approximately 1.5 L of clear yellow fluid was removed. Samples were sent to the laboratory as requested by the clinical team. IMPRESSION: Successful ultrasound guided right thoracentesis yielding 1.5 L of pleural fluid. Read by: Alwyn Ren, NP Electronically Signed   By: Malachy Moan M.D.   On: 04/20/2021 12:06     Assessment and Plan:   Severe aortic stenosis - Echo with suspected low-flow low gradient aortic stenosis.Vmax 2.5 m/s, MG 17 mmHG, AVA 0.68, DI 0.20. The CW jet is rounded with prolonged AT >100. Aortic valve mean gradient measures  17.0 mmHg. Aortic valve peak gradient measures 24.6 mmHg. Aortic valve  area, by VTI measures 0.68 cm.  -Given concern for malignancy and failure to thrive he is not a surgical candidate -Conservative management  2.  Acute on chronic combined  CHF - EF was 30-35% in 2016. LVEF 45-50% in 2017. - Echo this admission LVEF of 35-40% and grade II DD.  - He did had coronary calcifications on prior evaluation in 2016 and unable to get cath due to AMS at that time - Not a candidate for invasive evaluation  - No ACE/ARB or Entresto due to CKD - Was on Bisoprolol but DC due to soft blood pressure  3. PAF -Carries diagnosis of paroxysmal atrial fibrillation but not on anticoagulation prior to arrival. -Admit EKG and telemetry have artifact, unable to clearly see P waves.  He was placed on heparin for anticoagulation this admission.  Last EKG with clearly visible P waves showing sinus rhythm.  4.  Liver cirrhosis with ascites -  S/p paracentesis -Consult for malignancy, pending CT scan result  5. CKD  SCr 1.74>>1.83>>1.94  (baseline around 1.7-1.8) Risk Assessment/Risk Scores:   [name] [employer] (NYHA) Functional Class NYHA Class III  CHA2DS2-VASc Score = 5  This indicates a 7.2% annual risk of stroke. The patient's score is based upon: CHF History: 1 HTN History: 1 Diabetes History: 0 Stroke History: 2 Vascular Disease History: 0 Age Score: 1 Gender Score: 0    For questions or updates, please contact [facility] Please consult [email or web address] for contact info under    Lorelei Pont, PA  04/21/2021 5:25 PM

## 2021-04-21 NOTE — Progress Notes (Signed)
Heart Failure Navigator Progress Note  Assessed for Heart & Vascular TOC clinic readiness.  Patient does not meet criteria due to severe AS. Palliative team consulted for GOC, pending. Code status changed to DNR this admission.   Navigator available for reassessment of patient.   Ozella Rocks, MSN, RN Heart Failure Nurse Navigator [phone]

## 2021-04-21 NOTE — Progress Notes (Signed)
PROGRESS NOTE  [name] [name]. WUJ:[id number] DOB: Jan 09, 1951   PCP: Marcine Matar, MD  Patient is from: Home.  Lives with friend.  Uses walker at baseline.  DOA: 04/18/2021 LOS: 3  Chief complaints:  Chief Complaint  Patient presents with   Shortness of Breath     Brief Narrative / Interim history: 70 year old M with PMH of combined CHF, PAF, COPD, CVA, CKD-CAD and HTN presenting with abdominal swelling, diarrhea, 30 pound weight loss and poor p.o. intake, and admitted with acute on chronic RF with hypoxia in the setting of large right pleural effusion, liver cirrhosis with ascites and AKI.  He underwent thoracocentesis with removal of 1.5 L transudative fluid.  He also underwent paracentesis with removal of 9 L.  He has markedly elevated AFP and CA 19-9.  TTE with LVEF of 35 to 40%, G2-DD, GH and severe aortic stenosis.  C. difficile negative.  GI following.  Cardiology and palliative medicine consulted.  Subjective: Seen and examined earlier this morning.  No major events overnight of this morning.  A little hypotensive at 87/62.  Reports significant improvement in his breathing but continues to report back pain and diarrhea.  He denies chest pain, nausea, vomiting or abdominal pain.  We discussed about his situation and grim prognosis.  He understand that he is not a candidate for surgical intervention.  After risk and benefit discussion about CPR or intubation, he elected to be DNR/DNI.   Objective: Vitals:   04/21/21 0519 04/21/21 0721 04/21/21 0806 04/21/21 1142  BP: (!) 87/62 (!) 82/61  (!) 82/62  Pulse:  71  70  Resp:  15  17  Temp:  97.6 F (36.4 C)  (!) 97.5 F (36.4 C)  TempSrc:  Oral  Oral  SpO2:  95% 96% 100%  Weight:      Height:        Intake/Output Summary (Last 24 hours) at 04/21/2021 1301 Last data filed at 04/21/2021 9562 Gross per 24 hour  Intake 1599.09 ml  Output --  Net 1599.09 ml   Filed Weights   04/19/21 0734 04/21/21 0412  Weight: 71.2  kg 60.4 kg    Examination:  GENERAL: Frail and chronically ill-appearing.  No apparent distress. HEENT: MMM.  Vision and hearing grossly intact.  NECK: Supple.  No apparent JVD.  RESP: 100% on 2 L.  No IWOB.  Fair aeration bilaterally. CVS:  RRR. Heart sounds normal.  ABD/GI/GU: BS+. Abd distended with concern for ascites. MSK/EXT:  Moves extremities.  Significant muscle mass and subcu fat loss. SKIN: no apparent skin lesion or wound NEURO: Awake, alert and oriented appropriately.  No apparent focal neuro deficit. PSYCH: Calm. Normal affect.   Procedures:  10/24-US guided paracentesis with removal of 9 L 10/25-US guided thoracocentesis of right pleural effusion with 1.5 L transudative fluid  Microbiology summarized: COVID-19 and influenza PCR nonreactive. C. difficile and GI panel negative Pleural fluid culture negative  Assessment & Plan: Acute on chronic RF with hypoxia-multifactorial including pleural effusion with lung collapse, ascites, combined CHF and aortic stenosis.  Uses 2 L at baseline.  Currently stable on home 2 L at rest -Treat treatable causes as below -Wean oxygen as able -Encourage incentive telemetry  Large right-sided pleural effusion with right lower lobe collapse-likely from CHF, liver cirrhosis and malnutrition -S/p US guided thoracocentesis of right pleural effusion with 1.5 L transudative and culture negative fluid  Acute on chronic combined CHF: TTE with LVEF of 35 to 40%, G2-DD, GH  and severe aortic stenosis.  He presents with significant effusion.  Received 2 doses of IV Lasix in ED.  Blood pressure precludes diuretic use. -Cardiology consulted -Monitor fluid status  Severe aortic stenosis: Doubt candidacy for surgical repair -Cardiology consulted  Possible liver cirrhosis with ascites: Likely alcoholic.  Quit drinking about 8 years ago.  Concern for liver malignancy with markedly elevated AFP, CA 19-9, unintentional weight loss and chronic  diarrhea. 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  No obstructive pathology noted on CT abdomen and pelvis Recent Labs    04/18/21 1558 04/19/21 0620 04/20/21 0428 04/21/21 0334  BUN 44* 43* 44* 49*  CREATININE 1.86* 1.74* 1.83* 1.94*  -Continue monitoring -Avoid or minimize nephrotoxic meds   Diarrhea-C. difficile and GI panel negative.  Could be from fluid overload and possible malignancy -Imodium as needed   Paroxysmal A. fib: CHA2DS2-VASc score 5.  Not on [facility] prior to admission -Continue IV heparin-but would be cautious in the setting of liver cirrhosis    Hypotension/history of essential hypertension- -Hold antihypertensive meds.  Thrombocytopenia:  Recent Labs  Lab 04/18/21 1558 04/19/21 0620 04/20/21 0428 04/21/21 0334  PLT 171 130* 126* PLATELET CLUMPS NOTED ON SMEAR, UNABLE TO ESTIMATE  -Continue monitoring  Normocytic anemia: H&H relatively stable. Recent Labs    04/18/21 1558 04/19/21 0620 04/20/21 0428 04/21/21 0334  HGB 13.4 10.9* 12.0* 11.0*  -Continue monitoring   Goal of care counseling-patient with significant comorbidity as above.  Poor prognosis.  However, still full code.  We discussed about pros and cons of CPR and intubation in light of his comorbidity.  I recommended DNR/DNI.  Patient is in agreement with this.  CODE STATUS changed -Palliative medicine consulted for further goals of care discussion.   Severe malnutrition: As evidenced by significant muscle mass and subcu fat loss Body mass index is 18.57 kg/m. Nutrition Problem: Severe Malnutrition Etiology: chronic illness (COPD) Signs/Symptoms: severe muscle depletion, severe fat depletion, percent weight loss Percent weight loss: 14 % Interventions: Boost Breeze, MVI, Liberalize Diet   DVT prophylaxis:    On IV  heparin  Code Status: DNR/DNI Family Communication: Patient and/or RN. Available if any question.  Level of care: Progressive Status is: Inpatient  Remains inpatient appropriate because: Hemodynamically unstable, further diagnostic evaluation, IV medication       Consultants:  Gastroenterology Cardiology Palliative medicine   Sch Meds:  Scheduled Meds:  atorvastatin  40 mg Oral Daily   feeding supplement  1 Container Oral TID BM   fluticasone furoate-vilanterol  1 puff Inhalation Daily   influenza vaccine adjuvanted  0.5 mL Intramuscular Tomorrow-1000   multivitamin with minerals  1 tablet Oral Daily   sodium chloride flush  3 mL Intravenous Q12H   traZODone  50 mg Oral QHS   Continuous Infusions:  sodium chloride     heparin 1,050 Units/hr (04/21/21 0927)   PRN Meds:.sodium chloride, ipratropium-albuterol, lidocaine (PF), oxyCODONE-acetaminophen, sodium chloride flush  Antimicrobials: Anti-infectives (From admission, onward)    None        I have personally reviewed the following labs and images: CBC: Recent Labs  Lab 04/18/21 1558 04/19/21 0620 04/20/21 0428 04/21/21 0334  WBC 9.2 7.2 6.6 6.4  NEUTROABS 8.0*  --   --   --   HGB 13.4 10.9* 12.0* 11.0*  HCT 41.5 34.1* 36.9* 33.6*  MCV 97.4 98.3 96.6  94.9  PLT 171 130* 126* PLATELET CLUMPS NOTED ON SMEAR, UNABLE TO ESTIMATE   BMP &GFR Recent Labs  Lab 04/18/21 1558 04/19/21 0620 04/20/21 0428 04/21/21 0334  NA 136 136 135 136  K 4.5 4.5 4.6 4.4  CL 104 106 105 105  CO2 20* 19* 20* 23  GLUCOSE 83 85 107* 109*  BUN 44* 43* 44* 49*  CREATININE 1.86* 1.74* 1.83* 1.94*  CALCIUM 9.2 8.2* 8.4* 8.4*  MG 2.1  --   --  2.0  PHOS  --   --   --  3.7   Estimated Creatinine Clearance: 29.8 mL/min (A) (by C-G formula based on SCr of 1.94 mg/dL (H)). Liver & Pancreas: Recent Labs  Lab 04/18/21 1558 04/19/21 0620 04/20/21 0428 04/21/21 0334  AST 50* 45* 44*  --   ALT --   ALKPHOS 171*  135* 142*  --   BILITOT 1.1 1.1 0.9  --   PROT 7.2 5.9* 6.1*  --   ALBUMIN 2.8* 2.2* 2.1* 2.4*   No results for input(s): LIPASE, AMYLASE in the last 168 hours. Recent Labs  Lab 04/18/21 1558  AMMONIA 26   Diabetic: No results for input(s): HGBA1C in the last 72 hours. No results for input(s): GLUCAP in the last 168 hours. Cardiac Enzymes: No results for input(s): CKTOTAL, CKMB, CKMBINDEX, TROPONINI in the last 168 hours. No results for input(s): PROBNP in the last 8760 hours. Coagulation Profile: Recent Labs  Lab 04/18/21 1558  INR 1.1   Thyroid Function Tests: No results for input(s): TSH, T4TOTAL, FREET4, T3FREE, THYROIDAB in the last 72 hours. Lipid Profile: No results for input(s): CHOL, HDL, LDLCALC, TRIG, CHOLHDL, LDLDIRECT in the last 72 hours. Anemia Panel: No results for input(s): VITAMINB12, FOLATE, FERRITIN, TIBC, IRON, RETICCTPCT in the last 72 hours. Urine analysis:    Component Value Date/Time   COLORURINE YELLOW 04/19/2021 1039   APPEARANCEUR CLEAR 04/19/2021 1039   LABSPEC 1.008 04/19/2021 1039   PHURINE 5.0 04/19/2021 1039   GLUCOSEU NEGATIVE 04/19/2021 1039   HGBUR NEGATIVE 04/19/2021 1039   BILIRUBINUR NEGATIVE 04/19/2021 1039   KETONESUR NEGATIVE 04/19/2021 1039   PROTEINUR NEGATIVE 04/19/2021 1039   UROBILINOGEN 0.2 07/22/2014 1330   NITRITE NEGATIVE 04/19/2021 1039   LEUKOCYTESUR NEGATIVE 04/19/2021 1039   Sepsis Labs: Invalid input(s): PROCALCITONIN, LACTICIDVEN  Microbiology: Recent Results (from the past 240 hour(s))  Resp Panel by RT-PCR (Flu A&B, Covid) Nasopharyngeal Swab     Status: None   Collection Time: 04/18/21  7:28 PM   Specimen: Nasopharyngeal Swab; Nasopharyngeal(NP) swabs in vial transport medium  Result Value Ref Range Status   SARS Coronavirus 2 by RT PCR NEGATIVE NEGATIVE Final    Comment: (NOTE) SARS-CoV-2 target nucleic acids are NOT DETECTED.  The SARS-CoV-2 RNA is generally detectable in upper  respiratory specimens during the acute phase of infection. The lowest concentration of SARS-CoV-2 viral copies this assay can detect is 138 copies/mL. A negative result does not preclude SARS-Cov-2 infection and should not be used as the sole basis for treatment or other patient management decisions. A negative result may occur with  improper specimen collection/handling, submission of specimen other than nasopharyngeal swab, presence of viral mutation(s) within the areas targeted by this assay, and inadequate number of viral copies(<138 copies/mL). A negative result must be combined with clinical observations, patient history, and epidemiological information. The expected result is Negative.  Fact Sheet for Patients:  BloggerCourse.com  Fact Sheet for Healthcare Providers:  SeriousBroker.it  This test is no t yet approved or cleared by the [place] and  has been authorized for detection and/or diagnosis of SARS-CoV-2 by FDA under an Emergency Use Authorization (EUA). This EUA will remain  in effect (meaning this test can be used) for the duration of the COVID-19 declaration under Section 564(b)(1) of the Act, 21 U.S.C.section 360bbb-3(b)(1), unless the authorization is terminated  or revoked sooner.       Influenza A by PCR NEGATIVE NEGATIVE Final   Influenza B by PCR NEGATIVE NEGATIVE Final    Comment: (NOTE) The Xpert Xpress SARS-CoV-2/FLU/RSV plus assay is intended as an aid in the diagnosis of influenza from Nasopharyngeal swab specimens and should not be used as a sole basis for treatment. Nasal washings and aspirates are unacceptable for Xpert Xpress SARS-CoV-2/FLU/RSV testing.  Fact Sheet for Patients: BloggerCourse.com  Fact Sheet for Healthcare Providers: SeriousBroker.it  This test is not yet approved or cleared by the [place] FDA and has been  authorized for detection and/or diagnosis of SARS-CoV-2 by FDA under an Emergency Use Authorization (EUA). This EUA will remain in effect (meaning this test can be used) for the duration of the COVID-19 declaration under Section 564(b)(1) of the Act, 21 U.S.C. section 360bbb-3(b)(1), unless the authorization is terminated or revoked.  Performed at [facility], [address], [place], [place] [address]   Gastrointestinal Panel by PCR , Stool     Status: None   Collection Time: 04/19/21  7:36 AM   Specimen: Stool  Result Value Ref Range Status   Campylobacter species NOT DETECTED NOT DETECTED Final   Plesimonas shigelloides NOT DETECTED NOT DETECTED Final   Salmonella species NOT DETECTED NOT DETECTED Final   Yersinia enterocolitica NOT DETECTED NOT DETECTED Final   Vibrio species NOT DETECTED NOT DETECTED Final   Vibrio cholerae NOT DETECTED NOT DETECTED Final   Enteroaggregative E coli (EAEC) NOT DETECTED NOT DETECTED Final   Enteropathogenic E coli (EPEC) NOT DETECTED NOT DETECTED Final   Enterotoxigenic E coli (ETEC) NOT DETECTED NOT DETECTED Final   Shiga like toxin producing E coli (STEC) NOT DETECTED NOT DETECTED Final   Shigella/Enteroinvasive E coli (EIEC) NOT DETECTED NOT DETECTED Final   Cryptosporidium NOT DETECTED NOT DETECTED Final   Cyclospora cayetanensis NOT DETECTED NOT DETECTED Final   Entamoeba histolytica NOT DETECTED NOT DETECTED Final   Giardia lamblia NOT DETECTED NOT DETECTED Final   Adenovirus F40/41 NOT DETECTED NOT DETECTED Final   Astrovirus NOT DETECTED NOT DETECTED Final   Norovirus GI/GII NOT DETECTED NOT DETECTED Final   Rotavirus A NOT DETECTED NOT DETECTED Final   Sapovirus (I, II, IV, and V) NOT DETECTED NOT DETECTED Final    Comment: Performed at [facility], [address] [place], [place] [address]  C Difficile Quick Screen w PCR reflex     Status: None   Collection Time: 04/19/21  7:36 AM   Specimen: Stool  Result Value  Ref Range Status   C Diff antigen NEGATIVE NEGATIVE Final   C Diff toxin NEGATIVE NEGATIVE Final   C Diff interpretation No C. difficile detected.  Final    Comment: Performed at [facility], [address] [place], [place] [address]  Culture, body fluid w Gram Stain-bottle     Status: None (Preliminary result)   Collection Time: 04/20/21 10:46 AM   Specimen: Fluid  Result Value Ref Range Status   Specimen Description FLUID  Final   Special Requests  RIGHT LUNG  Final   Culture   Final    NO GROWTH < 24 HOURS Performed at [facility], [address], [place], [place] [address]    Report Status PENDING  Incomplete  Gram stain     Status: None   Collection Time: 04/20/21 10:46 AM   Specimen: Fluid  Result Value Ref Range Status   Specimen Description FLUID  Final   Special Requests  RIGHT LUNG  Final   Gram Stain   Final    WBC PRESENT,BOTH PMN AND MONONUCLEAR NO ORGANISMS SEEN CYTOSPIN SMEAR Performed at [facility], [address], [place], [place] [address]    Report Status 04/20/2021 FINAL  Final    Radiology Studies: ECHOCARDIOGRAM COMPLETE  Result Date: 04/20/2021    ECHOCARDIOGRAM REPORT   Patient Name:   [name] [name]. Date of Exam: 04/20/2021 Medical Rec #:  [id number]        Height:       71.0 in Accession #:    [id number]       Weight:       157.0 lb Date of Birth:  [birth year]        BSA:          1.902 m Patient Age:    70 years         BP:           100/58 mmHg Patient Gender: M                HR:           70 bpm. Exam Location:  Inpatient Procedure: 2D Echo, Color Doppler and Cardiac Doppler Indications:    Dyspnea  History:        Patient has no prior history of Echocardiogram examinations.                 CHF, Arrythmias:Atrial Fibrillation, Signs/Symptoms:Shortness of                 Breath; Risk Factors:Hypertension and Dyslipidemia.  Sonographer:    Cleatis Polka Referring Phys: [id number] RIPUDEEP K RAI IMPRESSIONS  1. Suspect severe low flow low  gradient aortic stenosis is present. Visually, the valve is severely calcified with restricted leaflet motion in systole. Vmax 2.5 m/s, MG 17 mmHG, AVA 0.68, DI 0.20. The CW jet is rounded with prolonged AT >100 msec. Would recommend dobutamine stress echo vs calcium score for clarification. The aortic valve is tricuspid. There is severe calcifcation of the aortic valve. There is severe thickening of the aortic valve. Aortic valve regurgitation is trivial. Severe aortic valve stenosis.  2. Left ventricular ejection fraction, by estimation, is 35 to 40%. Left ventricular ejection fraction by 2D MOD biplane is 37.7 %. The left ventricle has moderately decreased function. The left ventricle demonstrates global hypokinesis. Left ventricular diastolic parameters are consistent with Grade II diastolic dysfunction (pseudonormalization).  3. Right ventricular systolic function is mildly reduced. The right ventricular size is mildly enlarged. Tricuspid regurgitation signal is inadequate for assessing PA pressure.  4. The mitral valve is degenerative. Mild mitral valve regurgitation. No evidence of mitral stenosis.  5. The inferior vena cava is normal in size with <50% respiratory variability, suggesting right atrial pressure of 8 mmHg. FINDINGS  Left Ventricle: Left ventricular ejection fraction, by estimation, is 35 to 40%. Left ventricular ejection fraction by 2D MOD biplane is 37.7 %. The left ventricle has moderately decreased function. The left ventricle demonstrates global  hypokinesis. The left ventricular internal cavity size was normal in size. There is no left ventricular hypertrophy. Left ventricular diastolic parameters are consistent with Grade II diastolic dysfunction (pseudonormalization). Right Ventricle: The right ventricular size is mildly enlarged. No increase in right ventricular wall thickness. Right ventricular systolic function is mildly reduced. Tricuspid regurgitation signal is inadequate for  assessing PA pressure. Left Atrium: Left atrial size was normal in size. Right Atrium: Right atrial size was normal in size. Pericardium: There is no evidence of pericardial effusion. Mitral Valve: The mitral valve is degenerative in appearance. There is mild calcification of the anterior mitral valve leaflet(s). Mild mitral valve regurgitation. No evidence of mitral valve stenosis. Tricuspid Valve: The tricuspid valve is grossly normal. Tricuspid valve regurgitation is trivial. Aortic Valve: Suspect severe low flow low gradient aortic stenosis is present. Visually, the valve is severely calcified with restricted leaflet motion in systole. Vmax 2.5 m/s, MG 17 mmHG, AVA 0.68, DI 0.20. The CW jet is rounded with prolonged AT >100 msec. Would recommend dobutamine stress echo vs calcium score for clarification. The aortic valve is tricuspid. There is severe calcifcation of the aortic valve. There is severe thickening of the aortic valve. Aortic valve regurgitation is trivial. Severe aortic stenosis is present. Aortic valve mean gradient measures 17.0 mmHg. Aortic valve peak gradient measures 24.6 mmHg. Aortic valve area, by VTI measures 0.68 cm. Pulmonic Valve: The pulmonic valve was grossly normal. Pulmonic valve regurgitation is trivial. No evidence of pulmonic stenosis. Aorta: The aortic root and ascending aorta are structurally normal, with no evidence of dilitation. Venous: The inferior vena cava is normal in size with less than 50% respiratory variability, suggesting right atrial pressure of 8 mmHg. IAS/Shunts: The atrial septum is grossly normal. Additional Comments: There is a small pleural effusion in the left lateral region. Mild ascites is present.  LEFT VENTRICLE PLAX 2D                        Biplane EF (MOD) LVIDd:         5.60 cm         LV Biplane EF:   Left LVIDs:         4.80 cm                          ventricular LV PW:         1.00 cm                          ejection LV IVS:        0.90 cm                           fraction by LVOT diam:     2.10 cm                          2D MOD LV SV:         41                               biplane is LV SV Index:   21  37.7 %. LVOT Area:     3.46 cm                                Diastology                                LV e' medial:    3.24 cm/s LV Volumes (MOD)               LV E/e' medial:  16.0 LV vol d, MOD    109.0 ml      LV e' lateral:   6.31 cm/s A2C:                           LV E/e' lateral: 8.2 LV vol d, MOD    156.0 ml A4C: LV vol s, MOD    64.6 ml A2C: LV vol s, MOD    96.4 ml A4C: LV SV MOD A2C:   44.4 ml LV SV MOD A4C:   156.0 ml LV SV MOD BP:    52.0 ml RIGHT VENTRICLE            IVC RV Basal diam:  4.10 cm    IVC diam: 1.60 cm RV S prime:     7.27 cm/s TAPSE (M-mode): 2.3 cm LEFT ATRIUM             Index        RIGHT ATRIUM           Index LA diam:        5.20 cm 2.73 cm/m   RA Area:     16.20 cm LA Vol (A2C):   57.1 ml 30.01 ml/m  RA Volume:   40.80 ml  21.45 ml/m LA Vol (A4C):   57.3 ml 30.12 ml/m LA Biplane Vol: 58.4 ml 30.70 ml/m  AORTIC VALVE AV Area (Vmax):    0.72 cm AV Area (Vmean):   0.70 cm AV Area (VTI):     0.68 cm AV Vmax:           248.00 cm/s AV Vmean:          188.000 cm/s AV VTI:            0.599 m AV Peak Grad:      24.6 mmHg AV Mean Grad:      17.0 mmHg LVOT Vmax:         51.70 cm/s LVOT Vmean:        38.200 cm/s LVOT VTI:          0.118 m LVOT/AV VTI ratio: 0.20  AORTA Ao Root diam: 3.40 cm Ao Asc diam:  3.80 cm MITRAL VALVE MV Area (PHT): 4.33 cm    SHUNTS MV Decel Time: 175 msec    Systemic VTI:  0.12 m MR Peak grad: 57.5 mmHg    Systemic Diam: 2.10 cm MR Vmax:      379.00 cm/s MV E velocity: 51.80 cm/s MV A velocity: 48.40 cm/s MV E/A ratio:  1.07 Lennie Odor MD Electronically signed by Lennie Odor MD Signature Date/Time: 04/20/2021/5:58:50 PM    Final      55 minutes with more than 50% spent in reviewing records, counseling patient/family and coordinating care.   Eliz Nigg T.  [facility] [employer] [facility]  If 7PM-7AM, please contact night-coverage [email or web address] 04/21/2021, 1:01 PM

## 2021-04-21 NOTE — Progress Notes (Signed)
Initial Nutrition Assessment  DOCUMENTATION CODES:  Severe malnutrition in context of chronic illness  INTERVENTION:  Recommend liberalizing diet to regular.  Discontinue Ensure BID.  Add Boost Breeze po TID, each supplement provides 250 kcal and 9 grams of protein.  Add MVI with minerals daily.  Encourage PO intake.  NUTRITION DIAGNOSIS:  Severe Malnutrition related to chronic illness (COPD) as evidenced by severe muscle depletion, severe fat depletion, percent weight loss.  GOAL:  Patient will meet greater than or equal to 90% of their needs  MONITOR:  PO intake, Supplement acceptance, Labs, Weight trends, I & O's  REASON FOR ASSESSMENT:  Consult Assessment of nutrition requirement/status  ASSESSMENT:  70 yo male with a PMH of CHF, (echo in 08/2015 EF of 45 to 50% with grade 1 DD), A. fib, hypertension, CVA in 2016, COPD presented for abdominal swelling and diarrhea.  Patient reported that in the last few weeks he had worsening abdominal swelling, distention and multiple episodes of diarrhea every day for the 1 week.  Patient reported decreased appetite for the last 2 months and had at least 30lbs weight loss in the last year. Admitted with acute on chronic respiratory failure with hypoxia. 10/25 - paracentesis (yielded 1.5 L of yellow fluid)  Spoke with pt along side OT this morning. Pt very anxious about eating, he reports "everything goes through him" and he has a lot of diarrhea when he eats. He has been eating less due to this at home.  Per Epic, pt ate 50% at dinner last night and 100% of breakfast this morning.  Per Epic, pt has lost 22 lbs (14%) in the last 4 months, which is significant and severe for the time frame.  Pt tried Boost Breeze (berry) with RD. Pt requested this. RD to order TID. Also recommend discontinuing Ensure BID, as pt does not like these. Recommend adding MVI with minerals daily.  Patient would benefit from a liberalized diet. RD to message MD  regarding this.  Supplements: Ensure Enlive BID  Medications: reviewed  Labs: reviewed; Glucose 109 (H), BUN 49 (H), Crt 1.94 (H)  NUTRITION - FOCUSED PHYSICAL EXAM: Flowsheet Row Most Recent Value  Orbital Region Severe depletion  Upper Arm Region Severe depletion  Thoracic and Lumbar Region Severe depletion  Buccal Region Severe depletion  Temple Region Severe depletion  Clavicle Bone Region Severe depletion  Clavicle and Acromion Bone Region Severe depletion  Scapular Bone Region Severe depletion  Dorsal Hand Severe depletion  Patellar Region Severe depletion  Anterior Thigh Region Severe depletion  Posterior Calf Region Severe depletion  Edema (RD Assessment) None  Hair Reviewed  Eyes Reviewed  Mouth Reviewed  Skin Reviewed  Nails Reviewed   Diet Order:   Diet Order             Diet regular Room service appropriate? Yes with Assist; Fluid consistency: Thin  Diet effective now                  EDUCATION NEEDS:  Education needs have been addressed  Skin:  Skin Assessment: Skin Integrity Issues: Skin Integrity Issues:: Other (Comment) Other: Wound on R ankle  Last BM:  04/20/21  Height:  Ht Readings from Last 1 Encounters:  04/19/21 5\' 11"  (1.803 m)   Weight:  Wt Readings from Last 1 Encounters:  04/21/21 60.4 kg   BMI:  Body mass index is 18.57 kg/m.  Estimated Nutritional Needs:  Kcal:  1800-2000 Protein:  85-100 grams Fluid:  >1.8 L  Vertell Limber, RD, LDN (she/her/hers) Registered Dietitian I Pager #: [phone] After-Hours/Weekend Pager # in [place]

## 2021-04-21 NOTE — Evaluation (Signed)
Physical Therapy Evaluation Patient Details Name: [name] [name]. MRN: [id number] DOB: 1950/12/13 Today's Date: 04/21/2021  History of Present Illness  Pt is a 70 yo male admitted on 10/23 with abdominal swelling and diarrhea. + for R lower lobe pleural effusion, CKD, unintended weight loss and questionable liver malignancy. On 10/24 underwent paracentesis complicated by hypotension. 10/25 underwent thoracentesis. PMH of CHF, afib, HTN, CVA, COPD, and anxiety.  Clinical Impression  Pt was seen for mobility at side of bed and reviewed orthostatics:  supine 86/61, sitting 74/51.  Pt was not able to stand well enough to get that value but also quite low sitting.  Pt is appropriate for SNF care due to mult medical issues, and the lack of care at home.  Follow acutely for goals of PT as are below, encouraging him to sit OOB in chair as tolerated and to increase his standing control as able.  Monitor vitals, coordinate with other therapies so as not to overwhelm him and overtire.         Recommendations for follow up therapy are one component of a multi-disciplinary discharge planning process, led by the attending physician.  Recommendations may be updated based on patient status, additional functional criteria and insurance authorization.  Follow Up Recommendations Skilled nursing-short term rehab (<3 hours/day)    Assistance Recommended at Discharge Frequent or constant Supervision/Assistance  Functional Status Assessment Patient has had a recent decline in their functional status and/or demonstrates limited ability to make significant improvements in function in a reasonable and predictable amount of time  Equipment Recommendations  None recommended by PT    Recommendations for Other Services       Precautions / Restrictions Precautions Precautions: Fall Precaution Comments: ck orthostatics Restrictions Weight Bearing Restrictions: No      Mobility  Bed Mobility Overal bed mobility:  Needs Assistance Bed Mobility: Supine to Sit;Sit to Supine     Supine to sit: Mod assist Sit to supine: Mod assist   General bed mobility comments: mod assist to get to side of bed from lifting trunk, legs and trunk back to bed    Transfers Overall transfer level: Needs assistance Equipment used: 1 person hand held assist Transfers: Sit to/from Stand Sit to Stand: Max assist;From elevated surface           General transfer comment: max of one to stand up weakly, tired from earlier effort wtih OT    Ambulation/Gait             General Gait Details: unable due to weakness and orthostatics  Stairs            Wheelchair Mobility    Modified Rankin (Stroke Patients Only)       Balance Overall balance assessment: Needs assistance Sitting-balance support: Feet supported Sitting balance-Leahy Scale: Fair     Standing balance support: Bilateral upper extremity supported;During functional activity Standing balance-Leahy Scale: Poor                               Pertinent Vitals/Pain Pain Assessment: Faces Faces Pain Scale: Hurts a little bit Pain Location: general stiffness and discomfort to get to side of bed Pain Descriptors / Indicators: Guarding    Home Living Family/patient expects to be discharged to:: Private residence Living Arrangements: Non-relatives/Friends Available Help at Discharge: Friend(s);Other (Comment) (roommate has broken leg) Type of Home: Other(Comment) (condominium) Home Access: Level entry       Home  Layout: One level Home Equipment: Agricultural consultant (2 wheels);Shower seat;Grab bars - tub/shower;Other (comment) (O2 at home) Additional Comments: has used RW and walked on furniture in the house    Prior Function Prior Level of Function : Independent/Modified Independent             Mobility Comments: RW out, furniture walking inside ADLs Comments: modified indepdent     Hand Dominance   Dominant Hand:  Right    Extremity/Trunk Assessment   Upper Extremity Assessment Upper Extremity Assessment: Defer to OT evaluation    Lower Extremity Assessment Lower Extremity Assessment: Generalized weakness    Cervical / Trunk Assessment Cervical / Trunk Assessment: Kyphotic  Communication   Communication: No difficulties  Cognition Arousal/Alertness: Awake/alert Behavior During Therapy: WFL for tasks assessed/performed Overall Cognitive Status: Within Functional Limits for tasks assessed Area of Impairment: Safety/judgement;Problem solving;Awareness                         Safety/Judgement: Decreased awareness of deficits Awareness: Emergent Problem Solving: Difficulty sequencing;Requires verbal cues;Requires tactile cues (difficulty sequencing transfers and performing mobility safely, little awareness of deficits) General Comments: followed instructions as asked but is tired and weak        General Comments General comments (skin integrity, edema, etc.): generally weak, poor skin quality and IV's are looking a bit traumatized    Exercises     Assessment/Plan    PT Assessment Patient needs continued PT services  PT Problem List Decreased strength;Decreased activity tolerance;Decreased balance;Decreased mobility;Decreased coordination;Cardiopulmonary status limiting activity       PT Treatment Interventions DME instruction;Gait training;Functional mobility training;Therapeutic activities;Therapeutic exercise;Balance training;Neuromuscular re-education;Patient/family education    PT Goals (Current goals can be found in the Care Plan section)  Acute Rehab PT Goals Patient Stated Goal: to feel better, to rest PT Goal Formulation: With patient Time For Goal Achievement: 05/05/21 Potential to Achieve Goals: Fair    Frequency Min 3X/week   Barriers to discharge Decreased caregiver support roommate is injured    Co-evaluation               AM-PAC PT "6 Clicks"  Mobility  Outcome Measure Help needed turning from your back to your side while in a flat bed without using bedrails?: A Little Help needed moving from lying on your back to sitting on the side of a flat bed without using bedrails?: A Lot Help needed moving to and from a bed to a chair (including a wheelchair)?: A Lot Help needed standing up from a chair using your arms (e.g., wheelchair or bedside chair)?: A Lot Help needed to walk in hospital room?: Total Help needed climbing 3-5 steps with a railing? : Total 6 Click Score: 11    End of Session Equipment Utilized During Treatment: Gait belt;Oxygen Activity Tolerance: Patient limited by fatigue;Patient limited by pain Patient left: in bed;with call bell/phone within reach;with bed alarm set Nurse Communication: Mobility status PT Visit Diagnosis: Unsteadiness on feet (R26.81);Muscle weakness (generalized) (M62.81);Difficulty in walking, not elsewhere classified (R26.2)    Time: 9767-3419 PT Time Calculation (min) (ACUTE ONLY): 17 min   Charges:   PT Evaluation $PT Eval Moderate Complexity: 1 Mod         Ivar Drape 04/21/2021, 12:20 PM  Samul Dada, PT PhD Acute Rehab Dept. Number: [facility] [phone] and [facility] [phone]

## 2021-04-21 NOTE — Care Management Important Message (Signed)
Important Message  Patient Details  Name: [name] [name]. MRN: [id number] Date of Birth: 1950/10/06   Medicare Important Message Given:  Yes     Greig Altergott 04/21/2021, 2:01 PM

## 2021-04-21 NOTE — Progress Notes (Signed)
Brief progress note  Please see consultation note which was completed today for further information.  Spoke with radiology in regards to triphasic CT for diagnosis of HCC.  They assured me this would be diagnostic.  I have ordered for later today, hopefully will be able to proceed given that creatinine is elevated.  Hyacinth Meeker, PA-C

## 2021-04-21 NOTE — Evaluation (Signed)
Occupational Therapy Evaluation Patient Details Name: [name] [name]. MRN: [id number] DOB: 05/07/1951 Today's Date: 04/21/2021   History of Present Illness Pt is a 70 yo male admitted on 10/23 with abdominal swelling and diarrhea. + for R lower lobe pleural effusion, CKD, unintended weight loss and questionable liver malignancy. On 10/24 underwent paracentesis complicated by hypotension. 10/25 underwent thoracentesis. PMH of CHF, afib, HTN, CVA, COPD, and anxiety.   Clinical Impression   Pt was indep with ADLs and mobility prior to admission using RW for outdoor mobility and furniture walking within his home. He lives with a roommate but they are currently recovering from broken leg. He has experienced significant weight loss over the last 6-12 months which has significantly impacted his activity tolerance thus leading to need for increased assist with all activity. He reports fatigue with all activity and no interest in eating or drinking. Will follow acutely to provide ADL modifications and adaptations, mobility training, strengthening and assist with d/c planning. Recommending short term rehab prior to return home. Pt is currently resistant to this plan.     Recommendations for follow up therapy are one component of a multi-disciplinary discharge planning process, led by the attending physician.  Recommendations may be updated based on patient status, additional functional criteria and insurance authorization.   Follow Up Recommendations  Skilled nursing-short term rehab (<3 hours/day)    Assistance Recommended at Discharge Frequent or constant Supervision/Assistance  Functional Status Assessment     Equipment Recommendations  BSC;Wheelchair (measurements OT);Wheelchair cushion (measurements OT);Hospital bed    Recommendations for Other Services   Monitor for SLP needs based on coughing with thin liquids     Precautions / Restrictions Precautions Precautions: Fall      Mobility  Bed Mobility Overal bed mobility: Needs Assistance Bed Mobility: Supine to Sit;Sit to Supine     Supine to sit: Mod assist;HOB elevated Sit to supine: +2 for physical assistance;Min assist;HOB elevated        Transfers Overall transfer level: Needs assistance Equipment used: Rolling walker (2 wheels);2 person hand held assist Transfers: Sit to/from Stand Sit to Stand: Mod assist;+2 physical assistance                  Balance Overall balance assessment: Needs assistance;Mild deficits observed, not formally tested Sitting-balance support: Single extremity supported;Feet unsupported                                       ADL either performed or assessed with clinical judgement   ADL Overall ADL's : Needs assistance/impaired Eating/Feeding: Independent Eating/Feeding Details (indicate cue type and reason): poor appetite due to diarrhea, drinking Boost while seated EOB without assist Grooming: Sitting;Set up   Upper Body Bathing: Minimal assistance;Sitting   Lower Body Bathing: Maximal assistance   Upper Body Dressing : Minimal assistance;Sitting Upper Body Dressing Details (indicate cue type and reason): managing hospital gown around IV site Lower Body Dressing: Maximal assistance;Sitting/lateral leans   Toilet Transfer: +2 for physical assistance;BSC   Toileting- Clothing Manipulation and Hygiene: Maximal assistance;Sit to/from stand       Functional mobility during ADLs: Moderate assistance;+2 for physical assistance;Rolling walker (2 wheels) General ADL Comments: poor activity tolerance and generalized weakness impacting indep with all ADLs and mobility     Vision Baseline Vision/History: 0 No visual deficits Ability to See in Adequate Light: 0 Adequate Patient Visual Report: No change from baseline  Vision Assessment?: No apparent visual deficits            Pertinent Vitals/Pain Pain Assessment: No/denies pain     Hand Dominance  Right   Extremity/Trunk Assessment Upper Extremity Assessment Upper Extremity Assessment: Generalized weakness   Lower Extremity Assessment Lower Extremity Assessment: Defer to PT evaluation   Cervical / Trunk Assessment Cervical / Trunk Assessment: Kyphotic   Communication Communication Communication: No difficulties   Cognition Arousal/Alertness: Awake/alert Behavior During Therapy: WFL for tasks assessed/performed Overall Cognitive Status: Impaired/Different from baseline Area of Impairment: Safety/judgement;Problem solving;Awareness                         Safety/Judgement: Decreased awareness of deficits Awareness: Emergent Problem Solving: Difficulty sequencing;Requires verbal cues;Requires tactile cues (difficulty sequencing transfers and performing mobility safely, little awareness of deficits)       General Comments  thin skin, tears to BUEs noted, high risk for breakdown due to weight loss and poor nutrition    Exercises     Shoulder Instructions      Home Living Family/patient expects to be discharged to:: Private residence Living Arrangements: Non-relatives/Friends Available Help at Discharge: Friend(s) (roommate has a broken leg) Type of Home: Other(Comment) (condo) Home Access: Level entry     Home Layout: One level     Bathroom Shower/Tub: Chief Strategy Officer: Handicapped height     Home Equipment: Agricultural consultant (2 wheels);Shower seat;Grab bars - tub/shower;Other (comment) (02)          Prior Functioning/Environment Prior Level of Function : Independent/Modified Independent             Mobility Comments: used RW outside of home ADLs Comments: modified indepdent        OT Problem List: Decreased activity tolerance;Decreased strength;Impaired balance (sitting and/or standing);Decreased safety awareness      OT Treatment/Interventions: Self-care/ADL training;Energy conservation;Patient/family  education;Therapeutic activities;DME and/or AE instruction    OT Goals(Current goals can be found in the care plan section) Acute Rehab OT Goals Patient Stated Goal: stop diarrhea and go home OT Goal Formulation: With patient Time For Goal Achievement: 05/05/21 ADL Goals Pt Will Perform Upper Body Bathing: with set-up;sitting Pt Will Perform Lower Body Bathing: with min guard assist;sitting/lateral leans;with adaptive equipment Pt Will Perform Upper Body Dressing: with modified independence;sitting Pt Will Perform Lower Body Dressing: with min assist;with adaptive equipment;sitting/lateral leans Pt Will Transfer to Toilet: with min assist;bedside commode;stand pivot transfer  OT Frequency: Min 2X/week   Barriers to D/C: Decreased caregiver support;Inaccessible home environment          Co-evaluation              AM-PAC OT "6 Clicks" Daily Activity     Outcome Measure Help from another person eating meals?: None Help from another person taking care of personal grooming?: A Little Help from another person toileting, which includes using toliet, bedpan, or urinal?: Total Help from another person bathing (including washing, rinsing, drying)?: A Lot Help from another person to put on and taking off regular upper body clothing?: A Little Help from another person to put on and taking off regular lower body clothing?: A Lot 6 Click Score: 15   End of Session Equipment Utilized During Treatment: Rolling walker (2 wheels)  Activity Tolerance: Patient limited by fatigue Patient left: in bed;with bed alarm set  OT Visit Diagnosis: Muscle weakness (generalized) (M62.81)  Time: 5188-4166 OT Time Calculation (min): 38 min Charges:  OT Evaluation $OT Eval Moderate Complexity: 1 Mod OT Treatments $Therapeutic Activity: 23-37 mins  Jasmine Pang Ulanda Tackett, OTR/Lpt  04/21/2021, 11:13 AM

## 2021-04-21 NOTE — Progress Notes (Signed)
ANTICOAGULATION CONSULT NOTE   Pharmacy Consult for Heparin Indication: atrial fibrillation  Allergies  Allergen Reactions   Levaquin [Levofloxacin In D5w]     Generalized redness; received azithromycin, rocephin, and levaquin on the same day as reaction developed.    Patient Measurements: Height: 5\' 11"  (180.3 cm) Weight: 60.4 kg (133 lb 2.5 oz) IBW/kg (Calculated) : 75.3 Heparin Dosing Weight: 71 kg  Vital Signs: Temp: 97.5 F (36.4 C) (10/26 1142) Temp Source: Oral (10/26 1142) BP: 82/62 (10/26 1142) Pulse Rate: 70 (10/26 1142)  Labs: Recent Labs    04/18/21 1558 04/19/21 0620 04/19/21 0807 04/20/21 0428 04/21/21 0334  HGB 13.4 10.9*  --  12.0* 11.0*  HCT 41.5 34.1*  --  36.9* 33.6*  PLT 171 130*  --  126* PLATELET CLUMPS NOTED ON SMEAR, UNABLE TO ESTIMATE  LABPROT 13.8  --   --   --   --   INR 1.1  --   --   --   --   HEPARINUNFRC  --   --  0.50 0.68 0.61  CREATININE 1.86* 1.74*  --  1.83* 1.94*     Estimated Creatinine Clearance: 29.8 mL/min (A) (by C-G formula based on SCr of 1.94 mg/dL (H)).   Medical History: Past Medical History:  Diagnosis Date   Anxiety    CHF (congestive heart failure) (HCC)    GERD (gastroesophageal reflux disease)    Heavy smoker    Shortness of breath dyspnea    Stroke (HCC)    a. Noted on CT 07/29/14    Medications:  Medications Prior to Admission  Medication Sig Dispense Refill Last Dose   fluticasone-salmeterol (ADVAIR DISKUS) 100-50 MCG/ACT AEPB Inhale 1 puff into the lungs 2 (two) times daily. 1 each 6 Past Week   polyvinyl alcohol (LIQUIFILM TEARS) 1.4 % ophthalmic solution Place 1 drop into both eyes as needed for dry eyes.   Past Week   atorvastatin (LIPITOR) 40 MG tablet Take 1 tablet (40 mg total) by mouth daily. 90 tablet 1    bisoprolol (ZEBETA) 5 MG tablet Take 1 tablet (5 mg total) by mouth daily. 90 tablet 1    furosemide (LASIX) 40 MG tablet Take 1 tablet by mouth once daily (Patient taking differently: Take  40 mg by mouth daily.) 30 tablet 5    lisinopril (ZESTRIL) 5 MG tablet Take 1 tablet (5 mg total) by mouth daily. 90 tablet 1    Scheduled:   atorvastatin  40 mg Oral Daily   feeding supplement  1 Container Oral TID BM   fluticasone furoate-vilanterol  1 puff Inhalation Daily   influenza vaccine adjuvanted  0.5 mL Intramuscular Tomorrow-1000   multivitamin with minerals  1 tablet Oral Daily   sodium chloride flush  3 mL Intravenous Q12H   traZODone  50 mg Oral QHS   Infusions:   sodium chloride     heparin 1,050 Units/hr (04/21/21 0927)   PRN: sodium chloride, ipratropium-albuterol, lidocaine (PF), oxyCODONE-acetaminophen, sodium chloride flush  Assessment: 70 yom with a history of anxiety, HF, GERD, smoker, SOB, stroke, HLD. Patient is presenting with SOB. Heparin per pharmacy consult  ordered on 10/23 for atrial fibrillation.  Not noted to be on anticoagulation PTA.  Heparin level 0.61 this AM remains therapeutic on Heparin IV at 1100 units/hr.    Hgb 13.4 trending down 11.0 and PLTC trending  down 126> clumped.   No bleeding noted.   GI plans triphasic CT for diagnosis of HCC later today.  Goal of Therapy:  Heparin level 0.3-0.7 units/ml Monitor platelets by anticoagulation protocol: Yes   Plan:  Continue IV heparin gtt 1050 units/hr Daily heparin level, CBC, s/s bleeding F/u IR plans, long term AC plans and ability to transition to PO  Thank you for allowing pharmacy to be part of this patients care team.  Noah Delaine, RPh Clinical Pharmacist [phone] 04/21/2021 12:48 PM Please check AMION for all [facility] phone numbers After 10:00 PM, call Main Pharmacy [phone]

## 2021-04-22 DIAGNOSIS — R932 Abnormal findings on diagnostic imaging of liver and biliary tract: Secondary | ICD-10-CM | POA: Diagnosis not present

## 2021-04-22 DIAGNOSIS — K7031 Alcoholic cirrhosis of liver with ascites: Secondary | ICD-10-CM

## 2021-04-22 DIAGNOSIS — N183 Chronic kidney disease, stage 3 unspecified: Secondary | ICD-10-CM | POA: Diagnosis not present

## 2021-04-22 DIAGNOSIS — N179 Acute kidney failure, unspecified: Secondary | ICD-10-CM

## 2021-04-22 DIAGNOSIS — J90 Pleural effusion, not elsewhere classified: Secondary | ICD-10-CM | POA: Diagnosis not present

## 2021-04-22 DIAGNOSIS — R188 Other ascites: Secondary | ICD-10-CM | POA: Diagnosis not present

## 2021-04-22 DIAGNOSIS — J9621 Acute and chronic respiratory failure with hypoxia: Secondary | ICD-10-CM | POA: Diagnosis not present

## 2021-04-22 LAB — COMPREHENSIVE METABOLIC PANEL
ALT: 26 U/L (ref 0–44)
AST: 57 U/L — ABNORMAL HIGH (ref 15–41)
Albumin: 2.4 g/dL — ABNORMAL LOW (ref 3.5–5.0)
Alkaline Phosphatase: 148 U/L — ABNORMAL HIGH (ref 38–126)
Anion gap: 8 (ref 5–15)
BUN: 52 mg/dL — ABNORMAL HIGH (ref 8–23)
CO2: 20 mmol/L — ABNORMAL LOW (ref 22–32)
Calcium: 8.4 mg/dL — ABNORMAL LOW (ref 8.9–10.3)
Chloride: 104 mmol/L (ref 98–111)
Creatinine, Ser: 2.13 mg/dL — ABNORMAL HIGH (ref 0.61–1.24)
GFR, Estimated: 32 mL/min — ABNORMAL LOW (ref >60)
Glucose, Bld: 113 mg/dL — ABNORMAL HIGH (ref 70–99)
Potassium: 4.3 mmol/L (ref 3.5–5.1)
Sodium: 132 mmol/L — ABNORMAL LOW (ref 135–145)
Total Bilirubin: 0.8 mg/dL (ref 0.3–1.2)
Total Protein: 5.9 g/dL — ABNORMAL LOW (ref 6.5–8.1)

## 2021-04-22 LAB — CBC
HCT: 34.4 % — ABNORMAL LOW (ref 39.0–52.0)
Hemoglobin: 11.5 g/dL — ABNORMAL LOW (ref 13.0–17.0)
MCH: 31.2 pg (ref 26.0–34.0)
MCHC: 33.4 g/dL (ref 30.0–36.0)
MCV: 93.2 fL (ref 80.0–100.0)
Platelets: 106 10*3/uL — ABNORMAL LOW (ref 150–400)
RBC: 3.69 MIL/uL — ABNORMAL LOW (ref 4.22–5.81)
RDW: 16.3 % — ABNORMAL HIGH (ref 11.5–15.5)
WBC: 9.2 10*3/uL (ref 4.0–10.5)
nRBC: 0 % (ref 0.0–0.2)

## 2021-04-22 LAB — PHOSPHORUS: Phosphorus: 2.8 mg/dL (ref 2.5–4.6)

## 2021-04-22 LAB — HEPARIN LEVEL (UNFRACTIONATED): Heparin Unfractionated: 0.56 IU/mL (ref 0.30–0.70)

## 2021-04-22 LAB — MAGNESIUM: Magnesium: 1.9 mg/dL (ref 1.7–2.4)

## 2021-04-22 MED ORDER — GLYCOPYRROLATE 0.2 MG/ML IJ SOLN
0.2000 mg | INTRAMUSCULAR | Status: DC | PRN
  Administered 2021-04-24: 0.2 mg via INTRAVENOUS
  Filled 2021-04-22: qty 1

## 2021-04-22 MED ORDER — HALOPERIDOL 0.5 MG PO TABS
0.5000 mg | ORAL_TABLET | ORAL | Status: DC | PRN
  Filled 2021-04-22: qty 1

## 2021-04-22 MED ORDER — HALOPERIDOL LACTATE 5 MG/ML IJ SOLN: 0.5000 mg | INTRAMUSCULAR | Status: DC | PRN

## 2021-04-22 MED ORDER — HYDROMORPHONE HCL 1 MG/ML IJ SOLN
0.5000 mg | INTRAMUSCULAR | Status: DC | PRN
  Administered 2021-04-23 – 2021-04-26 (×4): 0.5 mg via INTRAVENOUS
  Filled 2021-04-22 (×4): qty 0.5

## 2021-04-22 MED ORDER — GLYCOPYRROLATE 1 MG PO TABS
1.0000 mg | ORAL_TABLET | ORAL | Status: DC | PRN
  Filled 2021-04-22: qty 1

## 2021-04-22 MED ORDER — ACETAMINOPHEN 325 MG PO TABS: 650.0000 mg | ORAL_TABLET | Freq: Four times a day (QID) | ORAL | Status: DC | PRN

## 2021-04-22 MED ORDER — LORAZEPAM 2 MG/ML IJ SOLN: 1.0000 mg | INTRAMUSCULAR | Status: DC | PRN

## 2021-04-22 MED ORDER — ACETAMINOPHEN 650 MG RE SUPP: 650.0000 mg | Freq: Four times a day (QID) | RECTAL | Status: DC | PRN

## 2021-04-22 MED ORDER — ONDANSETRON 4 MG PO TBDP
4.0000 mg | ORAL_TABLET | Freq: Four times a day (QID) | ORAL | Status: DC | PRN
  Filled 2021-04-22: qty 1

## 2021-04-22 MED ORDER — LORAZEPAM 1 MG PO TABS: 1.0000 mg | ORAL_TABLET | ORAL | Status: DC | PRN

## 2021-04-22 MED ORDER — GLYCOPYRROLATE 0.2 MG/ML IJ SOLN: 0.2000 mg | INTRAMUSCULAR | Status: DC | PRN

## 2021-04-22 MED ORDER — POLYVINYL ALCOHOL 1.4 % OP SOLN
1.0000 [drp] | Freq: Four times a day (QID) | OPHTHALMIC | Status: DC | PRN
  Filled 2021-04-22: qty 15

## 2021-04-22 MED ORDER — LORAZEPAM 2 MG/ML PO CONC: 1.0000 mg | ORAL | Status: DC | PRN

## 2021-04-22 MED ORDER — HYDROMORPHONE HCL 1 MG/ML IJ SOLN
0.5000 mg | Freq: Four times a day (QID) | INTRAMUSCULAR | Status: DC
Start: 2021-04-22 — End: 2021-04-26
  Administered 2021-04-23 – 2021-04-26 (×13): 0.5 mg via INTRAVENOUS
  Filled 2021-04-22 (×13): qty 0.5

## 2021-04-22 MED ORDER — ONDANSETRON HCL 4 MG/2ML IJ SOLN: 4.0000 mg | Freq: Four times a day (QID) | INTRAMUSCULAR | Status: DC | PRN

## 2021-04-22 MED ORDER — BIOTENE DRY MOUTH MT LIQD: 15.0000 mL | OROMUCOSAL | Status: DC | PRN

## 2021-04-22 MED ORDER — MIDODRINE HCL 5 MG PO TABS
10.0000 mg | ORAL_TABLET | Freq: Three times a day (TID) | ORAL | Status: DC
  Administered 2021-04-22: 10 mg via ORAL
  Filled 2021-04-22: qty 2

## 2021-04-22 MED ORDER — ALBUMIN HUMAN 25 % IV SOLN
12.5000 g | Freq: Four times a day (QID) | INTRAVENOUS | Status: DC
Start: 2021-04-22 — End: 2021-04-22
  Administered 2021-04-22: 12.5 g via INTRAVENOUS
  Filled 2021-04-22: qty 50

## 2021-04-22 MED ORDER — HALOPERIDOL LACTATE 2 MG/ML PO CONC
0.5000 mg | ORAL | Status: DC | PRN
  Filled 2021-04-22: qty 0.3

## 2021-04-22 NOTE — Progress Notes (Signed)
   04/22/21 0512  Vitals  Temp (!) 97.4 F (36.3 C)  Temp Source Oral  BP (!) 84/58  MAP (mmHg) 67  BP Location Right Arm  BP Method Automatic  Patient Position (if appropriate) Lying  Resp 17  Level of Consciousness  Level of Consciousness Alert  MEWS COLOR  MEWS Score Color Green  Oxygen Therapy  SpO2 94 %  O2 Device Nasal Cannula  O2 Flow Rate (L/min) 3 L/min  Height and Weight  Weight 64.4 kg  Type of Scale Used Bed  Type of Weight Actual  BMI (Calculated) 19.81  MEWS Score  MEWS Temp 0  MEWS Systolic 1  MEWS Pulse 0  MEWS RR 0  MEWS LOC 0  MEWS Score 1  Pt's blood pressure low, pt is asymptomatic resting in bed. Dr. Julian Reil notified no new orders made. Will continue to monitor.

## 2021-04-22 NOTE — Progress Notes (Signed)
[facility]  Received request from [facility] for family interest in [place]. [facility] is unable to offer a room today. Hospital Liaison will follow up tomorrow or sooner if a room becomes available. Patient chart is pending eligibility at this time. Brother Trey Paula is contact and is aware.   Please do not hesitate to call with questions.   Thank you,  Yolande Jolly, BSN, RN  [facility] Liaison (listed on AMION under [facility] [phone]   [phone]

## 2021-04-22 NOTE — Social Work (Signed)
CSW spoke with pt brother to confirm residential Hospice placement, pt brother is comfortable with [facility]. CSW informed pt brother about the process of getting into residential hospice and informed him that he should get a call soon. CSW will continue to follow pt for DC needs.

## 2021-04-22 NOTE — Progress Notes (Signed)
ANTICOAGULATION CONSULT NOTE   Pharmacy Consult for Heparin Indication: atrial fibrillation  Allergies  Allergen Reactions   Levaquin [Levofloxacin In D5w]     Generalized redness; received azithromycin, rocephin, and levaquin on the same day as reaction developed.    Patient Measurements: Height: 5\' 11"  (180.3 cm) Weight: 64.4 kg (141 lb 15.6 oz) IBW/kg (Calculated) : 75.3 Heparin Dosing Weight: 71 kg  Vital Signs: Temp: 97.4 F (36.3 C) (10/27 0512) Temp Source: Oral (10/27 0512) BP: 84/58 (10/27 0512) Pulse Rate: 98 (10/27 0000)  Labs: Recent Labs    04/20/21 0428 04/21/21 0334 04/22/21 0229  HGB 12.0* 11.0* 11.5*  HCT 36.9* 33.6* 34.4*  PLT 126* PLATELET CLUMPS NOTED ON SMEAR, UNABLE TO ESTIMATE 106*  HEPARINUNFRC 0.68 0.61 0.56  CREATININE 1.83* 1.94* 2.13*     Estimated Creatinine Clearance: 29 mL/min (A) (by C-G formula based on SCr of 2.13 mg/dL (H)).   Medical History: Past Medical History:  Diagnosis Date   Anxiety    CHF (congestive heart failure) (HCC)    GERD (gastroesophageal reflux disease)    Heavy smoker    Shortness of breath dyspnea    Stroke (HCC)    a. Noted on CT 07/29/14    Medications:  Medications Prior to Admission  Medication Sig Dispense Refill Last Dose   fluticasone-salmeterol (ADVAIR DISKUS) 100-50 MCG/ACT AEPB Inhale 1 puff into the lungs 2 (two) times daily. 1 each 6 Past Week   polyvinyl alcohol (LIQUIFILM TEARS) 1.4 % ophthalmic solution Place 1 drop into both eyes as needed for dry eyes.   Past Week   atorvastatin (LIPITOR) 40 MG tablet Take 1 tablet (40 mg total) by mouth daily. 90 tablet 1    bisoprolol (ZEBETA) 5 MG tablet Take 1 tablet (5 mg total) by mouth daily. 90 tablet 1    furosemide (LASIX) 40 MG tablet Take 1 tablet by mouth once daily (Patient taking differently: Take 40 mg by mouth daily.) 30 tablet 5    lisinopril (ZESTRIL) 5 MG tablet Take 1 tablet (5 mg total) by mouth daily. 90 tablet 1    Scheduled:    atorvastatin  40 mg Oral Daily   feeding supplement  1 Container Oral TID BM   fluticasone furoate-vilanterol  1 puff Inhalation Daily   influenza vaccine adjuvanted  0.5 mL Intramuscular Tomorrow-1000   multivitamin with minerals  1 tablet Oral Daily   sodium chloride flush  3 mL Intravenous Q12H   traZODone  50 mg Oral QHS   Infusions:   sodium chloride     heparin 1,050 Units/hr (04/21/21 1907)   PRN: sodium chloride, ipratropium-albuterol, lidocaine (PF), loperamide, oxyCODONE-acetaminophen, sodium chloride flush  Assessment: 70 yoM w/ hx PAF no on [facility] PTA admitted with respiratory failure and cirrhosis. Pt started on IV heparin for anticoagulation. 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  Heparin level is therapeutic, H/H stable, no S/Sx bleeding documented.  Goal of Therapy:  Heparin level 0.3-0.7 units/ml Monitor platelets by anticoagulation protocol: Yes   Plan:  Continue IV heparin gtt 1050 units/hr Daily heparin level, CBC, s/s bleeding   11/26, PharmD, BCPS, [facility] Clinical Pharmacist [phone] Please check AMION for all [facility] Pharmacy numbers 04/22/2021

## 2021-04-22 NOTE — Progress Notes (Addendum)
Daily Rounding Note  04/22/2021, 1:52 PM  LOS: 4 days   SUBJECTIVE:   Chief complaint:   Diagnosis of cirrhosis.  Weight loss.  No specific complaints other than that he feels tired and weak.  Denies abdominal pain.  He is not eating well but no nausea or vomiting.  OBJECTIVE:         Vital signs in last 24 hours:    Temp:  [97.4 F (36.3 C)-98.1 F (36.7 C)] 97.8 F (36.6 C) (10/27 1114) Pulse Rate:  [76-98] 98 (10/27 0000) Resp:  [17-20] 17 (10/27 0512) BP: (84-102)/(58-76) 84/58 (10/27 0512) SpO2:  [90 %-96 %] 94 % (10/27 0512) Weight:  [64.4 kg] 64.4 kg (10/27 0512) Last BM Date: 04/21/21 Filed Weights   04/19/21 0734 04/21/21 0412 04/22/21 0512  Weight: 71.2 kg 60.4 kg 64.4 kg   General: Looks cachectic, chronically ill.  Resting comfortably on bed Heart: RRR. Chest: Absent breath sounds on right base, otherwise globally diminished breath sounds.  No labored breathing or cough at rest. Abdomen: Distended, moderately tense.  Small umbilical hernia.  Not tender.  Active bowel sounds Extremities: No CCE. Neuro/Psych: Oriented x3 moves all 4 limbs.  No asterixis, no tremors.  Laconic.  Appropriate.  Intake/Output from previous day: 10/26 0701 - 10/27 0700 In: 1253.3 [P.O.:960; I.V.:293.3] Out: 0   Intake/Output this shift: Total I/O In: 162.7 [P.O.:120; I.V.:42.7] Out: -   Lab Results: Recent Labs    04/20/21 0428 04/21/21 0334 04/22/21 0229  WBC 6.6 6.4 9.2  HGB 12.0* 11.0* 11.5*  HCT 36.9* 33.6* 34.4*  PLT 126* PLATELET CLUMPS NOTED ON SMEAR, UNABLE TO ESTIMATE 106*   BMET Recent Labs    04/20/21 0428 04/21/21 0334 04/22/21 0229  NA 135 136 132*  K 4.6 4.4 4.3  CL 105 105 104  CO2 20* 23 20*  GLUCOSE 107* 109* 113*  BUN 44* 49* 52*  CREATININE 1.83* 1.94* 2.13*  CALCIUM 8.4* 8.4* 8.4*   LFT Recent Labs    04/20/21 0428 04/21/21 0334 04/22/21 0229  PROT 6.1*  --  5.9*  ALBUMIN  2.1* 2.4* 2.4*  AST 44*  --  57*  ALT 24  --  26  ALKPHOS 142*  --  148*  BILITOT 0.9  --  0.8   PT/INR No results for input(s): LABPROT, INR in the last 72 hours. Hepatitis Panel No results for input(s): HEPBSAG, HCVAB, HEPAIGM, HEPBIGM in the last 72 hours.  Studies/Results: ECHOCARDIOGRAM COMPLETE  Result Date: 04/20/2021 IMPRESSIONS  1. Suspect severe low flow low gradient aortic stenosis is present. Visually, the valve is severely calcified with restricted leaflet motion in systole. Vmax 2.5 m/s, MG 17 mmHG, AVA 0.68, DI 0.20. The CW jet is rounded with prolonged AT >100 msec. Would recommend dobutamine stress echo vs calcium score for clarification. The aortic valve is tricuspid. There is severe calcifcation of the aortic valve. There is severe thickening of the aortic valve. Aortic valve regurgitation is trivial. Severe aortic valve stenosis.  2. Left ventricular ejection fraction, by estimation, is 35 to 40%. Left ventricular ejection fraction by 2D MOD biplane is 37.7 %. The left ventricle has moderately decreased function. The left ventricle demonstrates global hypokinesis. Left ventricular diastolic parameters are consistent with Grade II diastolic dysfunction (pseudonormalization).  3. Right ventricular systolic function is mildly reduced. The right ventricular size is mildly enlarged. Tricuspid regurgitation signal is inadequate for assessing PA pressure.  4. The mitral valve  is degenerative. Mild mitral valve regurgitation. No evidence of mitral stenosis.  5. The inferior vena cava is normal in size with <50% respiratory variability, suggesting right atrial pressure of 8 mmHg. Eleonore Chiquito MD Electronically signed by Eleonore Chiquito MD Signature Date/Time: 04/20/2021/5:58:50 PM    Final    CT LIVER ABDOMEN W WO CONTRAST  Result Date: 04/21/2021 CLINICAL DATA:  Evaluate liver lesion seen on noncontrast study 04/18/2021 EXAM: CT ABDOMEN WITH CONTRAST TECHNIQUE: Multidetector CT imaging  of the abdomen was performed using the standard protocol following bolus administration of intravenous contrast. CONTRAST:  114m OMNIPAQUE IOHEXOL 350 MG/ML SOLN COMPARISON:  CT scan 04/18/2021 FINDINGS: Lower chest: Large right pleural effusion and small left pleural effusion. The left lung bases grossly clear. The heart is within normal limits in size. No pericardial effusion. Moderate aortic calcifications. Hepatobiliary: Severe cirrhotic changes involving the liver with areas of confluent hepatic fibrosis and associated cortical scarring changes and retraction. I do not see an obvious early arterial phase enhancing hepatic lesion to suggest a hepatoma. There is thrombosis of the middle portal vein and also partial thrombosis of the right portal vein centrally. Extensive network of arterial vessels around the occluded middle portal vein and slightly more distally there appears to be collateralization or intrahepatic cavernous transformation. There are also some more distal branches of the middle portal vein that are thrombosed. Portal venous phase images show patent hepatic veins. The gallbladder is grossly normal.  No common bile duct dilatation. Pancreas: No mass, inflammation or ductal dilatation. Spleen: Normal size.  No focal lesions. Adrenals/Urinary Tract: The adrenal glands and kidneys are grossly normal. Stomach/Bowel: The stomach is distended with fluid, gas and fluid. The duodenum is grossly. The visualized small bowel and colon grossly no. Vascular/Lymphatic: Advanced atherosclerotic calcification involving the aorta and branch vessels but no aneurysm or dissection. The main portal vein and splenic vein are patent. No mesenteric or retroperitoneal mass adenopathy. Scattered upper abdominal lymph nodes typical with cirrhosis. Other: Large volume abdominal ascites with a markedly distended abdomen. Musculoskeletal: No significant bony findings. IMPRESSION: 1. Advanced cirrhotic changes involving the  liver with areas of confluent hepatic fibrosis and associated cortical scarring changes and retraction. No obvious early arterial phase enhancing hepatic lesion to suggest a hepatoma. 2. Thrombosis of the middle portal vein and partial thrombosis of the right portal vein with associated perfusion abnormality and predominant arterial blood flow. Intrahepatic cavernous transformation noted. 3. Large volume abdominal ascites with a markedly distended abdomen. 4. Large right pleural effusion and small left pleural effusion. 5. Advanced atherosclerotic calcification involving the aorta and branch vessels. Aortic Atherosclerosis (ICD10-I70.0). Electronically Signed   By: PMarijo SanesM.D.   On: 04/21/2021 17:30    Scheduled Meds:  fluticasone furoate-vilanterol  1 puff Inhalation Daily    HYDROmorphone (DILAUDID) injection  0.5 mg Intravenous Q6H   sodium chloride flush  3 mL Intravenous Q12H   traZODone  50 mg Oral QHS   Continuous Infusions:  sodium chloride     albumin human 12.5 g (04/22/21 1212)   PRN Meds:.sodium chloride, acetaminophen **OR** acetaminophen, antiseptic oral rinse, glycopyrrolate **OR** glycopyrrolate **OR** glycopyrrolate, haloperidol **OR** haloperidol **OR** haloperidol lactate, HYDROmorphone (DILAUDID) injection, ipratropium-albuterol, loperamide, LORazepam **OR** LORazepam **OR** LORazepam, ondansetron **OR** ondansetron (ZOFRAN) IV, polyvinyl alcohol, sodium chloride flush  Scheduled Meds:  fluticasone furoate-vilanterol  1 puff Inhalation Daily    HYDROmorphone (DILAUDID) injection  0.5 mg Intravenous Q6H   sodium chloride flush  3 mL Intravenous Q12H   traZODone  50 mg Oral QHS   Continuous Infusions:  sodium chloride     albumin human 12.5 g (04/22/21 1212)   PRN Meds:.sodium chloride, acetaminophen **OR** acetaminophen, antiseptic oral rinse, glycopyrrolate **OR** glycopyrrolate **OR** glycopyrrolate, haloperidol **OR** haloperidol **OR** haloperidol lactate,  HYDROmorphone (DILAUDID) injection, ipratropium-albuterol, loperamide, LORazepam **OR** LORazepam **OR** LORazepam, ondansetron **OR** ondansetron (ZOFRAN) IV, polyvinyl alcohol, sodium chloride flush   ASSESMENT:   Cirrhosis, ? [place] in patient with extensive history of alcohol abuse (abstinent since ~ 2016).  Elevated AFP 2957 and CA 19-9: 1977.  CT liver, abdomen shows advanced cirrhosis but no enhancement suggesting hepatoma, portal vein thrombosis with cavernous transformation, large volume ascites with associated abdominal distention.  Elevated alk phos, overall improved.  Mild elevation AST.  Ascites.  10/24 paracentesis of 9 L.  No SBP on fluid analysis.  No diuretics in place.    A. fib.  Hx CVA.  No current or PTA anticoagulants.  R >> L pleural effusion on CT post 1.5 L right thoracentesis on 10/25.       CHF.   Severe aortic stenosis, not an operative candidate.  AKI, progressive.  Oliguria/anuria,   Hepatorenal/cardiorenal syndrome?  3 doses of IV albumin q 6 h  initiated today.  No measured urine output, 1 episode urination entered in chart yesterday.  No urinary output entered so far today.  C. difficile and GI path PCR negative diarrhea.   PRN imodium in place.       Thrombocytopenia.   Platelets progressively declining: 171 ... 106   PLAN      Per Dr Candis Schatz.    Prealbumin ordered for tomorrow morning, suspect protein calorie malnutrition.  Also ordered a CBC and bmet.  Palliative care consult note in process.  Repeat paracentesis or thoracentesis?    Azucena Freed  04/22/2021, 1:52 PM Phone [phone]

## 2021-04-22 NOTE — Progress Notes (Signed)
PROGRESS NOTE  [name] [name]. IPJ:[id number] DOB: 15-May-1951   PCP: Marcine Matar, MD  Patient is from: Home.  Lives with friend.  Uses walker at baseline.  DOA: 04/18/2021 LOS: 4  Chief complaints:  Chief Complaint  Patient presents with   Shortness of Breath     Brief Narrative / Interim history: 69 year old M with PMH of combined CHF, PAF, COPD, CVA, CKD-CAD and HTN presenting with abdominal swelling, diarrhea, 30 pound weight loss and poor p.o. intake, and admitted with acute on chronic RF with hypoxia in the setting of large right pleural effusion, liver cirrhosis with ascites and AKI.  He underwent thoracocentesis with removal of 1.5 L transudative and culture negative fluid.  He also underwent paracentesis with removal of 9 L.  He has markedly elevated AFP and CA 19-9.  C. difficile negative.  CT of his liver on abdomen with advanced cirrhotic changes, portal vein thrombosis, intrahepatic cavernous transformation, large volume ascites, large right pleural effusion but no evidence of hepatic lesion to suggest hepatoma.   TTE with LVEF of 35 to 40%, G2-DD, GH and severe aortic stenosis.  Cardiology consulted and did not have much to offer given his other comorbidities and poor prognosis.  Patient has progressive decline goal of care discussion initiated.  CODE STATUS changed to DNR/DNI on 10/26.  Palliative medicine consulted.   Subjective: Seen and examined earlier this morning.  No major events overnight of this morning.  He states that he does not feel good.  Abdomen feels swollen.  Breathing is "okay".  We discussed about CT finding and quick recurrence of large ascites and pleural effusion.  We discussed treatment options including repeat paracentesis and thoracocentesis which he might need every 3 to 4 days.  I also broached palliative approach.  Patient is interested in full comfort care but likes to discuss with his brother Tinnie Gens before making decision.  I talked to  [place] over the phone.  He will be visiting about 12:30 PM with his other brother to discuss further.  Objective: Vitals:   04/21/21 1954 04/22/21 0000 04/22/21 0512 04/22/21 0750  BP: 93/61 102/76 (!) 84/58   Pulse: 76 98    Resp: 19 20 17    Temp: 98.1 F (36.7 C) 97.6 F (36.4 C) (!) 97.4 F (36.3 C) 97.6 F (36.4 C)  TempSrc: Oral Oral Oral Oral  SpO2: 96% 90% 94%   Weight:   64.4 kg   Height:        Intake/Output Summary (Last 24 hours) at 04/22/2021 1045 Last data filed at 04/22/2021 1004 Gross per 24 hour  Intake 729.06 ml  Output 0 ml  Net 729.06 ml   Filed Weights   04/19/21 0734 04/21/21 0412 04/22/21 0512  Weight: 71.2 kg 60.4 kg 64.4 kg    Examination:  GENERAL: Frail and chronically ill-appearing.  No apparent distress. HEENT: MMM.  Vision and hearing grossly intact.  NECK: Supple.  No apparent JVD.  RESP: 94% on 3 L.  No IWOB.  Fair aeration bilaterally. CVS:  RRR. Heart sounds normal.  ABD/GI/GU: BS+.  Abdomen distended with ascites MSK/EXT:  Moves extremities.  Significant muscle mass and subcu fat loss SKIN: no apparent skin lesion or wound NEURO: Awake and alert. Oriented appropriately.  No apparent focal neuro deficit. PSYCH: Calm. Normal affect.   Procedures:  10/24-US guided paracentesis with removal of 9 L 10/25-US guided thoracocentesis of right pleural effusion with 1.5 L transudative fluid  Microbiology summarized: COVID-19 and influenza  PCR nonreactive. C. difficile and GI panel negative Pleural fluid culture negative  Assessment & Plan: Acute on chronic RF with hypoxia-multifactorial including pleural effusion with lung collapse, ascites, combined CHF and aortic stenosis.  Uses 2 L at baseline.  Currently on 3 L. -Treat treatable causes as below -Wean oxygen as able -Encourage incentive telemetry  Recurrent large right-sided pleural effusion with right lower lobe collapse-likely from CHF, liver cirrhosis and malnutrition -S/p US  guided thoracocentesis of right pleural effusion with 1.5 L transudative and culture negative fluid  Acute on chronic combined CHF: TTE with LVEF of 35 to 40%, G2-DD, GH and severe aortic stenosis.  He presents with significant effusion.  Received 2 doses of IV Lasix in ED.  Blood pressure precludes diuretic use. -Cardiology consulted -Monitor fluid status  Severe aortic stenosis: Doubt candidacy for surgical repair -Cardiology consulted  Possible liver cirrhosis with ascites: Likely alcoholic.  Quit drinking about 8 years ago.  Concern for liver malignancy with markedly elevated AFP, CA 19-9, unintentional weight loss and chronic diarrhea.  CT liver and abdomen with cirrhosis, large ascites, large pleural effusion, portal vein thrombosis and intrahepatic cavernous transformation.  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 difficile and GI panel negative.  Could be from fluid overload and possible malignancy -Imodium as needed   Paroxysmal A. fib: CHA2DS2-VASc score 5.  Not on [facility] prior to admission -Discontinue IV heparin.   Hypotension/history of essential hypertension- -Hold antihypertensive meds.  Thrombocytopenia: Gradually dropping. Recent Labs  Lab 04/18/21 1558 04/19/21 0620 04/20/21 0428 04/21/21 0334 04/22/21 0229  PLT 171 130* 126* PLATELET CLUMPS NOTED ON SMEAR, UNABLE TO ESTIMATE 106*  -Continue  monitoring  Normocytic anemia: H&H relatively stable. Recent Labs    04/18/21 1558 04/19/21 0620 04/20/21 0428 04/21/21 0334 04/22/21 0229  HGB 13.4 10.9* 12.0* 11.0* 11.5*  -Continue monitoring   Goal of care counseling-changed CODE STATUS to DNR/DNI on 10/26.  Now he is leaning toward full comfort care but waiting on his brothers before making decision.   Severe malnutrition: As evidenced by significant muscle mass and subcu fat loss Body mass index is 19.8 kg/m. Nutrition Problem: Severe Malnutrition Etiology: chronic illness (COPD) Signs/Symptoms: severe muscle depletion, severe fat depletion, percent weight loss Percent weight loss: 14 % Interventions: Boost Breeze, MVI, Liberalize Diet   DVT prophylaxis:    On IV heparin  Code Status: DNR/DNI Family Communication: Updated patient's brother over the phone. Level of care: Progressive Status is: Inpatient  Remains inpatient appropriate because: Hemodynamically unstable       Consultants:  Gastroenterology Cardiology Palliative medicine   Sch Meds:  Scheduled Meds:  atorvastatin  40 mg Oral Daily   feeding supplement  1 Container Oral TID BM   fluticasone furoate-vilanterol  1 puff Inhalation Daily   influenza vaccine adjuvanted  0.5 mL Intramuscular Tomorrow-1000   multivitamin with minerals  1 tablet Oral Daily   sodium chloride flush  3 mL Intravenous Q12H   traZODone  50 mg Oral QHS   Continuous Infusions:  sodium chloride     heparin 1,050 Units/hr (04/22/21 1004)   PRN Meds:.sodium chloride, ipratropium-albuterol, lidocaine (PF), loperamide, oxyCODONE-acetaminophen, sodium chloride flush  Antimicrobials: Anti-infectives (From admission, onward)    None        I have personally reviewed the following labs and images: CBC: Recent Labs  Lab 04/18/21 1558 04/19/21 0620 04/20/21 0428 04/21/21 0334 04/22/21 0229  WBC 9.2 7.2 6.6 6.4 9.2  NEUTROABS 8.0*  --   --   --   --   HGB 13.4  10.9* 12.0* 11.0* 11.5*  HCT 41.5 34.1* 36.9* 33.6* 34.4*  MCV 97.4 98.3 96.6 94.9 93.2  PLT 171 130* 126* PLATELET CLUMPS NOTED ON SMEAR, UNABLE TO ESTIMATE 106*   BMP &GFR Recent Labs  Lab 04/18/21 1558 04/19/21 0620 04/20/21 0428 04/21/21 0334 04/22/21 0229  NA 136 136 135 136 132*  K 4.5 4.5 4.6 4.4 4.3  CL 104 106 105 105 104  CO2 20* 19* 20* 23 20*  GLUCOSE 83 85 107* 109* 113*  BUN 44* 43* 44* 49* 52*  CREATININE 1.86* 1.74* 1.83* 1.94* 2.13*  CALCIUM 9.2 8.2* 8.4* 8.4* 8.4*  MG 2.1  --   --  2.0 1.9  PHOS  --   --   --  3.7 2.8   Estimated Creatinine Clearance: 29 mL/min (A) (by C-G formula based on SCr of 2.13 mg/dL (H)). Liver & Pancreas: Recent Labs  Lab 04/18/21 1558 04/19/21 0620 04/20/21 0428 04/21/21 0334 04/22/21 0229  AST 50* 45* 44*  --  57*  ALT --  26  ALKPHOS 171* 135* 142*  --  148*  BILITOT 1.1 1.1 0.9  --  0.8  PROT 7.2 5.9* 6.1*  --  5.9*  ALBUMIN 2.8* 2.2* 2.1* 2.4* 2.4*   No results for input(s): LIPASE, AMYLASE in the last 168 hours. Recent Labs  Lab 04/18/21 1558  AMMONIA 26   Diabetic: No results for input(s): HGBA1C in the last 72 hours. No results for input(s): GLUCAP in the last 168 hours. Cardiac Enzymes: No results for input(s): CKTOTAL, CKMB, CKMBINDEX, TROPONINI in the last 168 hours. No results for input(s): PROBNP in the last 8760 hours. Coagulation Profile: Recent Labs  Lab 04/18/21 1558  INR 1.1   Thyroid Function Tests: No results for input(s): TSH, T4TOTAL, FREET4, T3FREE, THYROIDAB in the last 72 hours. Lipid Profile: No results for input(s): CHOL, HDL, LDLCALC, TRIG, CHOLHDL, LDLDIRECT in the last 72 hours. Anemia Panel: No results for input(s): VITAMINB12, FOLATE, FERRITIN, TIBC, IRON, RETICCTPCT in the last 72 hours. Urine analysis:    Component Value Date/Time   COLORURINE YELLOW 04/19/2021 1039   APPEARANCEUR CLEAR 04/19/2021 1039   LABSPEC 1.008 04/19/2021 1039   PHURINE 5.0 04/19/2021  1039   GLUCOSEU NEGATIVE 04/19/2021 1039   HGBUR NEGATIVE 04/19/2021 1039   BILIRUBINUR NEGATIVE 04/19/2021 1039   KETONESUR NEGATIVE 04/19/2021 1039   PROTEINUR NEGATIVE 04/19/2021 1039   UROBILINOGEN 0.2 07/22/2014 1330   NITRITE NEGATIVE 04/19/2021 1039   LEUKOCYTESUR NEGATIVE 04/19/2021 1039   Sepsis Labs: Invalid input(s): PROCALCITONIN, LACTICIDVEN  Microbiology: Recent Results (from the past 240 hour(s))  Resp Panel by RT-PCR (Flu A&B, Covid) Nasopharyngeal Swab     Status: None   Collection Time: 04/18/21  7:28 PM   Specimen: Nasopharyngeal Swab; Nasopharyngeal(NP) swabs in vial transport medium  Result Value Ref Range Status   SARS Coronavirus 2 by RT PCR NEGATIVE NEGATIVE Final    Comment: (NOTE) SARS-CoV-2 target nucleic  acids are NOT DETECTED.  The SARS-CoV-2 RNA is generally detectable in upper respiratory specimens during the acute phase of infection. The lowest concentration of SARS-CoV-2 viral copies this assay can detect is 138 copies/mL. A negative result does not preclude SARS-Cov-2 infection and should not be used as the sole basis for treatment or other patient management decisions. A negative result may occur with  improper specimen collection/handling, submission of specimen other than nasopharyngeal swab, presence of viral mutation(s) within the areas targeted by this assay, and inadequate number of viral copies(<138 copies/mL). A negative result must be combined with clinical observations, patient history, and epidemiological information. The expected result is Negative.  Fact Sheet for Patients:  BloggerCourse.com  Fact Sheet for Healthcare Providers:  SeriousBroker.it  This test is no t yet approved or cleared by the [place] FDA and  has been authorized for detection and/or diagnosis of SARS-CoV-2 by FDA under an Emergency Use Authorization (EUA). This EUA will remain  in effect (meaning  this test can be used) for the duration of the COVID-19 declaration under Section 564(b)(1) of the Act, 21 U.S.C.section 360bbb-3(b)(1), unless the authorization is terminated  or revoked sooner.       Influenza A by PCR NEGATIVE NEGATIVE Final   Influenza B by PCR NEGATIVE NEGATIVE Final    Comment: (NOTE) The Xpert Xpress SARS-CoV-2/FLU/RSV plus assay is intended as an aid in the diagnosis of influenza from Nasopharyngeal swab specimens and should not be used as a sole basis for treatment. Nasal washings and aspirates are unacceptable for Xpert Xpress SARS-CoV-2/FLU/RSV testing.  Fact Sheet for Patients: BloggerCourse.com  Fact Sheet for Healthcare Providers: SeriousBroker.it  This test is not yet approved or cleared by the [place] FDA and has been authorized for detection and/or diagnosis of SARS-CoV-2 by FDA under an Emergency Use Authorization (EUA). This EUA will remain in effect (meaning this test can be used) for the duration of the COVID-19 declaration under Section 564(b)(1) of the Act, 21 U.S.C. section 360bbb-3(b)(1), unless the authorization is terminated or revoked.  Performed at [facility], [address], [place], [place] [address]   Gastrointestinal Panel by PCR , Stool     Status: None   Collection Time: 04/19/21  7:36 AM   Specimen: Stool  Result Value Ref Range Status   Campylobacter species NOT DETECTED NOT DETECTED Final   Plesimonas shigelloides NOT DETECTED NOT DETECTED Final   Salmonella species NOT DETECTED NOT DETECTED Final   Yersinia enterocolitica NOT DETECTED NOT DETECTED Final   Vibrio species NOT DETECTED NOT DETECTED Final   Vibrio cholerae NOT DETECTED NOT DETECTED Final   Enteroaggregative E coli (EAEC) NOT DETECTED NOT DETECTED Final   Enteropathogenic E coli (EPEC) NOT DETECTED NOT DETECTED Final   Enterotoxigenic E coli (ETEC) NOT DETECTED NOT DETECTED Final   Shiga like  toxin producing E coli (STEC) NOT DETECTED NOT DETECTED Final   Shigella/Enteroinvasive E coli (EIEC) NOT DETECTED NOT DETECTED Final   Cryptosporidium NOT DETECTED NOT DETECTED Final   Cyclospora cayetanensis NOT DETECTED NOT DETECTED Final   Entamoeba histolytica NOT DETECTED NOT DETECTED Final   Giardia lamblia NOT DETECTED NOT DETECTED Final   Adenovirus F40/41 NOT DETECTED NOT DETECTED Final   Astrovirus NOT DETECTED NOT DETECTED Final   Norovirus GI/GII NOT DETECTED NOT DETECTED Final   Rotavirus A NOT DETECTED NOT DETECTED Final   Sapovirus (I, II, IV, and V) NOT DETECTED NOT DETECTED Final    Comment: Performed at [employer]  [facility], [address] [place], [place] [address]  C Difficile Quick Screen w PCR reflex     Status: None   Collection Time: 04/19/21  7:36 AM   Specimen: Stool  Result Value Ref Range Status   C Diff antigen NEGATIVE NEGATIVE Final   C Diff toxin NEGATIVE NEGATIVE Final   C Diff interpretation No C. difficile detected.  Final    Comment: Performed at [facility], [address] [place], [place] [address]  Culture, body fluid w Gram Stain-bottle     Status: None (Preliminary result)   Collection Time: 04/20/21 10:46 AM   Specimen: Fluid  Result Value Ref Range Status   Specimen Description FLUID  Final   Special Requests  RIGHT LUNG  Final   Culture   Final    NO GROWTH 2 DAYS Performed at [facility], [address], [place], [place] [address]    Report Status PENDING  Incomplete  Gram stain     Status: None   Collection Time: 04/20/21 10:46 AM   Specimen: Fluid  Result Value Ref Range Status   Specimen Description FLUID  Final   Special Requests  RIGHT LUNG  Final   Gram Stain   Final    WBC PRESENT,BOTH PMN AND MONONUCLEAR NO ORGANISMS SEEN CYTOSPIN SMEAR Performed at [facility], [address], [place], [place] [address]    Report Status 04/20/2021 FINAL  Final    Radiology Studies: CT LIVER ABDOMEN W WO  CONTRAST  Result Date: 04/21/2021 CLINICAL DATA:  Evaluate liver lesion seen on noncontrast study 04/18/2021 EXAM: CT ABDOMEN WITH CONTRAST TECHNIQUE: Multidetector CT imaging of the abdomen was performed using the standard protocol following bolus administration of intravenous contrast. CONTRAST:  OMNIPAQUE IOHEXOL 350 MG/ML SOLN COMPARISON:  CT scan 04/18/2021 FINDINGS: Lower chest: Large right pleural effusion and small left pleural effusion. The left lung bases grossly clear. The heart is within normal limits in size. No pericardial effusion. Moderate aortic calcifications. Hepatobiliary: Severe cirrhotic changes involving the liver with areas of confluent hepatic fibrosis and associated cortical scarring changes and retraction. I do not see an obvious early arterial phase enhancing hepatic lesion to suggest a hepatoma. There is thrombosis of the middle portal vein and also partial thrombosis of the right portal vein centrally. Extensive network of arterial vessels around the occluded middle portal vein and slightly more distally there appears to be collateralization or intrahepatic cavernous transformation. There are also some more distal branches of the middle portal vein that are thrombosed. Portal venous phase images show patent hepatic veins. The gallbladder is grossly normal.  No common bile duct dilatation. Pancreas: No mass, inflammation or ductal dilatation. Spleen: Normal size.  No focal lesions. Adrenals/Urinary Tract: The adrenal glands and kidneys are grossly normal. Stomach/Bowel: The stomach is distended with fluid, gas and fluid. The duodenum is grossly. The visualized small bowel and colon grossly no. Vascular/Lymphatic: Advanced atherosclerotic calcification involving the aorta and branch vessels but no aneurysm or dissection. The main portal vein and splenic vein are patent. No mesenteric or retroperitoneal mass adenopathy. Scattered upper abdominal lymph nodes typical with cirrhosis.  Other: Large volume abdominal ascites with a markedly distended abdomen. Musculoskeletal: No significant bony findings. IMPRESSION: 1. Advanced cirrhotic changes involving the liver with areas of confluent hepatic fibrosis and associated cortical scarring changes and retraction. No obvious early arterial phase enhancing hepatic lesion to suggest a hepatoma. 2. Thrombosis of the middle portal vein and  partial thrombosis of the right portal vein with associated perfusion abnormality and predominant arterial blood flow. Intrahepatic cavernous transformation noted. 3. Large volume abdominal ascites with a markedly distended abdomen. 4. Large right pleural effusion and small left pleural effusion. 5. Advanced atherosclerotic calcification involving the aorta and branch vessels. Aortic Atherosclerosis (ICD10-I70.0). Electronically Signed   By: Rudie Meyer M.D.   On: 04/21/2021 17:30     Verner Kopischke T. Cuma Polyakov [facility]  If 7PM-7AM, please contact night-coverage [email or web address] 04/22/2021, 10:45 AM

## 2021-04-22 NOTE — Consult Note (Signed)
Consultation Note Date: 04/22/2021   Patient Name: [name] [name].  DOB: 1950-08-31  MRN: [id number]  Age / Sex: 70 y.o., male  PCP: Ladell Pier, MD Referring Physician: Mercy Riding, MD  Reason for Consultation:   HPI/Patient Profile: 70 y.o. male  with past medical history of CHF, A. fib, COPD, CVA, CKD, CAD, and HTN, admitted on 04/18/2021 with terminal swelling, weight loss, poor p.o. intake, diarrhea, acute on chronic respiratory failure with hypoxia.  Work-up revealed a large right pleural effusion, liver cirrhosis with ascites, hepatorenal and cardiorenal syndrome.  He had thoracentesis and paracentesis with very rapid reaccumulation of all fluids.  His recovery is complicated by ongoing hypotension.  He is not eating. Palliative medicine consulted for goals of care discussion.  Primary Decision Maker NEXT OF KIN - brother [name] [name]  Discussion: Met with patient and his 2 brothers.  They had received an update and had conversation with Dr. Cyndia Skeeters regarding his illnesses and option for comfort and hospice. Oren expressed his wish "just to get some rest". He expressed irritation and nighttime interruptions. He doesn't want to be stuck with needles anymore.  He only wants to receive comfort and not be in pain as his illness progresses and he dies. His brothers are in agreement with this plan. He has pain in his back and abdomen- percocet has not provided him with relief. We discussed utlizing IV pain medication and he is in agreement. Other comfort measures including interventions for anxiety, sleep and shortness of breath were discussed.  He and family are in agreement to seek placement at residential hospice facility. He defers discussion to his brother, [name] [name].     SUMMARY OF RECOMMENDATIONS -Transition to full comfort measures only -Hydromorphone .1m IV q6hrs scheduled and q2hrs prn for  pain -Other comfort interventions as ordered  -TOC order for residential hospice placement     Code Status/Advance Care Planning: DNR   Prognosis:   < 2 weeks  Discharge Planning: Hospice facility  Primary Diagnoses: Present on Admission:  Ascites  QT prolongation  Unintended weight loss  Acute on chronic respiratory failure with hypoxia (HCC)   Review of Systems  Physical Exam  Vital Signs: BP (!) 84/58 (BP Location: Right Arm)   Pulse 98   Temp 97.8 F (36.6 C) (Oral)   Resp 17   Ht 5' 11"  (1.803 m)   Wt 64.4 kg   SpO2 94%   BMI 19.80 kg/m  Pain Scale: 0-10   Pain Score: 0-No pain   SpO2: SpO2: 94 % O2 Device:SpO2: 94 % O2 Flow Rate: .O2 Flow Rate (L/min): 3 L/min  IO: Intake/output summary:  Intake/Output Summary (Last 24 hours) at 04/22/2021 1352 Last data filed at 04/22/2021 1004 Gross per 24 hour  Intake 729.06 ml  Output 0 ml  Net 729.06 ml    LBM: Last BM Date: 04/21/21 Baseline Weight: Weight: 71.2 kg Most recent weight: Weight: 64.4 kg     Palliative Assessment/Data: PPS: 20%       Thank  you for this consult. Palliative medicine will continue to follow and assist as needed.   Time In: 1320 Time Out: 1430 Time Total: 70 minutes Greater than 50%  of this time was spent counseling and coordinating care related to the above assessment and plan.  Signed by: Mariana Kaufman, AGNP-C Palliative Medicine    Please contact Palliative Medicine Team phone at [phone] for questions and concerns.  For individual provider: See Shea Evans

## 2021-04-23 DIAGNOSIS — N183 Chronic kidney disease, stage 3 unspecified: Secondary | ICD-10-CM | POA: Diagnosis not present

## 2021-04-23 DIAGNOSIS — R9431 Abnormal electrocardiogram [ECG] [EKG]: Secondary | ICD-10-CM

## 2021-04-23 DIAGNOSIS — Z515 Encounter for palliative care: Secondary | ICD-10-CM | POA: Diagnosis not present

## 2021-04-23 DIAGNOSIS — Z66 Do not resuscitate: Secondary | ICD-10-CM

## 2021-04-23 DIAGNOSIS — R188 Other ascites: Secondary | ICD-10-CM | POA: Diagnosis not present

## 2021-04-23 DIAGNOSIS — K767 Hepatorenal syndrome: Secondary | ICD-10-CM | POA: Diagnosis not present

## 2021-04-23 DIAGNOSIS — J90 Pleural effusion, not elsewhere classified: Secondary | ICD-10-CM | POA: Diagnosis not present

## 2021-04-23 DIAGNOSIS — J9621 Acute and chronic respiratory failure with hypoxia: Secondary | ICD-10-CM | POA: Diagnosis not present

## 2021-04-23 DIAGNOSIS — R932 Abnormal findings on diagnostic imaging of liver and biliary tract: Secondary | ICD-10-CM | POA: Diagnosis not present

## 2021-04-23 NOTE — Progress Notes (Signed)
Daily Progress Note   Patient Name: [name] [name].       Date: 04/23/2021 DOB: [birth year]  Age: 69 y.o. MRN#: [id number] Attending Physician: Almon Hercules, MD Primary Care Physician: Marcine Matar, MD Admit Date: 04/18/2021  Reason for Consultation/Follow-up: Establishing goals of care  Patient Profile/HPI:   70 y.o. male  with past medical history of CHF, A. fib, COPD, CVA, CKD, CAD, and HTN, admitted on 04/18/2021 with terminal swelling, weight loss, poor p.o. intake, diarrhea, acute on chronic respiratory failure with hypoxia.  Work-up revealed a large right pleural effusion, liver cirrhosis with ascites, hepatorenal and cardiorenal syndrome.  He had thoracentesis and paracentesis with very rapid reaccumulation of all fluids.  His recovery is complicated by ongoing hypotension.  He is not eating. Palliative medicine consulted for goals of care discussion.  Subjective: [name] [name] appears to be resting comfortably. No family at bedside.  Due to his request for "good rest" yesterday I did not make any attempt to wake him.  Called his brother Trey Paula for followup- left a message with my contact information.   Review of Systems  Unable to perform ROS: Acuity of condition    Physical Exam Vitals and nursing note reviewed.  Neurological:     Comments: Sleeping             Vital Signs: BP (!) 92/55 (BP Location: Left Arm)   Pulse 79   Temp 98 F (36.7 C) (Oral)   Resp 17   Ht 5\' 11"  (1.803 m)   Wt 64.4 kg   SpO2 96%   BMI 19.80 kg/m  SpO2: SpO2: 96 % O2 Device: O2 Device: Nasal Cannula O2 Flow Rate: O2 Flow Rate (L/min): 3 L/min  Intake/output summary:  Intake/Output Summary (Last 24 hours) at 04/23/2021 1501 Last data filed at 04/23/2021 04/25/2021 Gross per 24 hour  Intake 220 ml   Output 0 ml  Net 220 ml   LBM: Last BM Date: 04/22/21 Baseline Weight: Weight: 71.2 kg Most recent weight: Weight: 64.4 kg       Palliative Assessment/Data: PPS: 20%      Patient Active Problem List   Diagnosis Date Noted   Protein-calorie malnutrition, severe 04/21/2021   Acute on chronic respiratory failure with hypoxia (HCC) 04/19/2021   Ascites 04/18/2021   Pleural effusion on  right 04/18/2021   Hypoxemia 04/18/2021   CKD (chronic kidney disease) stage 3, GFR 30-59 ml/min (HCC) 04/18/2021   PAF (paroxysmal atrial fibrillation) (HCC) 04/18/2021   Abnormal CT of liver 04/18/2021   Diarrhea 04/18/2021   Essential hypertension 04/18/2021   Unintended weight loss 12/11/2020   Hypoxemic respiratory failure, chronic (HCC) 12/11/2020   Renal insufficiency 12/11/2020   Colon cancer screening 09/14/2015   Chronic systolic congestive heart failure (HCC) 09/14/2015   Acute systolic congestive heart failure (HCC) 08/12/2014   COPD bronchitis 08/12/2014   Alcohol abuse 08/12/2014   Cigarette nicotine dependence in remission 08/12/2014   Hyperlipidemia 08/12/2014   COLD (chronic obstructive lung disease) (HCC)    Thrombocytopenia (HCC)    QT prolongation    Korsakoff syndrome (HCC)    Chronic alcohol abuse    Altered consciousness    Shortness of breath    SOB (shortness of breath)    Atrial fibrillation (HCC) 07/24/2014   CHF exacerbation (HCC) 07/22/2014   Acute respiratory failure with hypoxia (HCC)    Alcohol withdrawal (HCC)    CAP (community acquired pneumonia)    Acute respiratory failure (HCC)    Dyspnea    CHF (congestive heart failure) (HCC) 07/21/2014    Palliative Care Assessment & Plan    Assessment/Recommendations/Plan  Continue current comfort measures Await bed at [facility]   Code Status: DNR  Prognosis:  < 2 weeks  Discharge Planning: Hospice facility  Care plan was discussed with patient and family.  Thank you for allowing the  Palliative Medicine Team to assist in the care of this patient.  Total time: 25 minutes Prolonged billing: No     Greater than 50%  of this time was spent counseling and coordinating care related to the above assessment and plan.  Ocie Bob, AGNP-C Palliative Medicine   Please contact Palliative Medicine Team phone at [phone] for questions and concerns.

## 2021-04-23 NOTE — Plan of Care (Signed)
  Problem: Pain Managment: Goal: General experience of comfort will improve Outcome: Completed/Met

## 2021-04-23 NOTE — Progress Notes (Signed)
[facility] ([facility])   There is not a bed available for Mr. Crisman today at [facility].   [facility] will update family and hospital staff once our availability changes.   Thank you, Wallis Bamberg RN, BSN [facility] Liaison

## 2021-04-23 NOTE — Progress Notes (Signed)
PROGRESS NOTE  [name] [name]. ZOX:[id number] DOB: 12/28/1950   PCP: Marcine Matar, MD  Patient is from: Home.  Lives with friend.  Uses walker at baseline.  DOA: 04/18/2021 LOS: 5  Chief complaints:  Chief Complaint  Patient presents with   Shortness of Breath     Brief Narrative / Interim history: 70 year old M with PMH of combined CHF, PAF, COPD, CVA, CKD-CAD and HTN presenting with abdominal swelling, diarrhea, 30 pound weight loss and poor p.o. intake, and admitted with acute on chronic RF with hypoxia in the setting of large right pleural effusion, liver cirrhosis with ascites and AKI.  He underwent thoracocentesis with removal of 1.5 L transudative and culture negative fluid.  He also underwent paracentesis with removal of 9 L.  He has markedly elevated AFP and CA 19-9.  C. difficile negative.  CT of his liver on abdomen with advanced cirrhotic changes, portal vein thrombosis, intrahepatic cavernous transformation, large volume ascites, large right pleural effusion but no evidence of hepatic lesion to suggest hepatoma.   TTE with LVEF of 35 to 40%, G2-DD, GH and severe aortic stenosis.  Cardiology consulted and did not have much to offer given his other comorbidities and poor prognosis.  Patient has progressive decline goal of care discussion initiated.  Eventually, transitioned to full comfort care on 04/22/2021.  Plan for transfer to [facility] when bed available.   Subjective: Seen and examined earlier this morning.  Patient had a bloody bowel movement yesterday evening.  No complaint this morning.  Denies pain and shortness of breath.  Appears comfortable.  Objective: Vitals:   04/22/21 1114 04/22/21 2000 04/23/21 0532 04/23/21 1143  BP:  (!) 73/48 (!) 85/57 (!) 92/55  Pulse:  80 75 79  Resp:  Temp: 97.8 F (36.6 C) (!) 97.5 F (36.4 C) 97.6 F (36.4 C) 98 F (36.7 C)  TempSrc: Oral Oral Oral Oral  SpO2:  94% 95% 96%  Weight:      Height:         Intake/Output Summary (Last 24 hours) at 04/23/2021 1706 Last data filed at 04/23/2021 8119 Gross per 24 hour  Intake 220 ml  Output 0 ml  Net 220 ml   Filed Weights   04/19/21 0734 04/21/21 0412 04/22/21 0512  Weight: 71.2 kg 60.4 kg 64.4 kg    Examination:  GENERAL: Frail and chronically ill-appearing.  No apparent distress. HEENT: MMM.  Vision and hearing grossly intact.  Temporal wasting. NECK: Notable JVD. RESP: 96% on 3 L.  No IWOB.  Fair aeration bilaterally. CVS:  RRR. Heart sounds normal.  ABD/GI/GU: BS+.  Soft but distended abdomen.  Ascites. MSK/EXT:  Moves extremities.  Significant muscle mass and subcu fat loss. SKIN: no apparent skin lesion or wound NEURO: Awake and alert. Oriented appropriately.  No apparent focal neuro deficit. PSYCH: Calm. Normal affect.   Procedures:  10/24-US guided paracentesis with removal of 9 L 10/25-US guided thoracocentesis of right pleural effusion with 1.5 L transudative fluid  Microbiology summarized: COVID-19 and influenza PCR nonreactive. C. difficile and GI panel negative Pleural fluid culture negative  Assessment & Plan: End-of-life care/full comfort care/DNR/DNI -Appreciate help by palliative care -Plan for transfer to [facility] when bed available.  Acute on chronic RF with hypoxia-multifactorial including pleural effusion with lung collapse, ascites, combined CHF and aortic stenosis.  On 2 L at baseline. -Comfortable on 3 L.  Recurrent large right-sided pleural effusion with right lower lobe collapse-likely from  CHF, liver cirrhosis and malnutrition -S/p US guided thoracocentesis of right pleural effusion with 1.5 L transudative and culture negative fluid  Acute on chronic combined CHF: TTE with LVEF of 35 to 40%, G2-DD, GH and severe aortic stenosis.  He presents with significant effusion.  Hypotension precludes diuretics.  Cardiology signed off  Severe aortic stenosis: Not a candidate for surgical repair.   Cardiology signed off  Liver cirrhosis with ascites/markedly elevated AFP and CA 19-9 concerning for malignancy: CT liver and abdomen with cirrhosis, large ascites, large pleural effusion, portal vein thrombosis and intrahepatic cavernous transformation.  -GI signed off.  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  Progressively worse.   Diarrhea-C. difficile and GI panel negative.  Could be from fluid overload and possible malignancy -Imodium as needed   Paroxysmal A. fib: CHA2DS2-VASc score 5.  Not on [facility] prior to admission   Hypotension/history of essential hypertension- -Hold antihypertensive meds.  Thrombocytopenia: Slowly dropping.   Goal of care counseling-for comfort care as above.   Severe malnutrition: As evidenced by significant muscle mass and subcu fat loss Body mass index is 19.8 kg/m. Nutrition Problem: Severe Malnutrition Etiology: chronic illness (COPD) Signs/Symptoms: severe muscle depletion, severe fat depletion, percent weight loss Percent weight loss: 14 % Interventions: Boost Breeze, MVI, Liberalize Diet   DVT prophylaxis:    On IV heparin  Code Status: DNR/DNI Family Communication: Updated patient's brother at the bedside on 10/27.  None at bedside today. Level of care: Palliative Care Status is: Inpatient  Remains inpatient appropriate because: Safe disposition/residential hospice at [facility]       Consultants:  Gastroenterology Cardiology Palliative medicine   Sch Meds:  Scheduled Meds:  fluticasone furoate-vilanterol  1 puff Inhalation Daily    HYDROmorphone (DILAUDID) injection  0.5 mg Intravenous Q6H   sodium chloride flush  3 mL Intravenous Q12H   traZODone  50 mg Oral QHS    Continuous Infusions:  sodium chloride     PRN Meds:.sodium chloride, acetaminophen **OR** acetaminophen, antiseptic oral rinse, glycopyrrolate **OR** glycopyrrolate **OR** glycopyrrolate, haloperidol **OR** haloperidol **OR** haloperidol lactate, HYDROmorphone (DILAUDID) injection, ipratropium-albuterol, loperamide, LORazepam **OR** LORazepam **OR** LORazepam, ondansetron **OR** ondansetron (ZOFRAN) IV, polyvinyl alcohol, sodium chloride flush  Antimicrobials: Anti-infectives (From admission, onward)    None        I have personally reviewed the following labs and images: CBC: Recent Labs  Lab 04/18/21 1558 04/19/21 0620 04/20/21 0428 04/21/21 0334 04/22/21 0229  WBC 9.2 7.2 6.6 6.4 9.2  NEUTROABS 8.0*  --   --   --   --   HGB 13.4 10.9* 12.0* 11.0* 11.5*  HCT 41.5 34.1* 36.9* 33.6* 34.4*  MCV 97.4 98.3 96.6 94.9 93.2  PLT 171 130* 126* PLATELET CLUMPS NOTED ON SMEAR, UNABLE TO ESTIMATE 106*   BMP &GFR Recent Labs  Lab 04/18/21 1558 04/19/21 0620 04/20/21 0428 04/21/21 0334 04/22/21 0229  NA 136 136 135 136 132*  K 4.5 4.5 4.6 4.4 4.3  CL 104 106 105 105 104  CO2 20* 19* 20* 23 20*  GLUCOSE 83 85 107* 109* 113*  BUN 44* 43* 44* 49* 52*  CREATININE 1.86* 1.74* 1.83* 1.94* 2.13*  CALCIUM 9.2 8.2* 8.4* 8.4* 8.4*  MG 2.1  --   --  2.0 1.9  PHOS  --   --   --  3.7 2.8   Estimated Creatinine Clearance: 29 mL/min (A) (by C-G formula based on SCr of 2.13 mg/dL (H)). Liver & Pancreas: Recent Labs  Lab 04/18/21 1558 04/19/21 0620 04/20/21 0428 04/21/21 0334 04/22/21 0229  AST 50* 45* 44*  --  57*  ALT 28 23 24   --  26  ALKPHOS 171* 135* 142*  --  148*  BILITOT 1.1 1.1 0.9  --  0.8  PROT 7.2 5.9* 6.1*  --  5.9*  ALBUMIN 2.8* 2.2* 2.1* 2.4* 2.4*   No results for input(s): LIPASE, AMYLASE in the last 168 hours. Recent Labs  Lab 04/18/21 1558  AMMONIA 26   Diabetic: No results for input(s): HGBA1C in the last 72 hours. No results for input(s): GLUCAP in  the last 168 hours. Cardiac Enzymes: No results for input(s): CKTOTAL, CKMB, CKMBINDEX, TROPONINI in the last 168 hours. No results for input(s): PROBNP in the last 8760 hours. Coagulation Profile: Recent Labs  Lab 04/18/21 1558  INR 1.1   Thyroid Function Tests: No results for input(s): TSH, T4TOTAL, FREET4, T3FREE, THYROIDAB in the last 72 hours. Lipid Profile: No results for input(s): CHOL, HDL, LDLCALC, TRIG, CHOLHDL, LDLDIRECT in the last 72 hours. Anemia Panel: No results for input(s): VITAMINB12, FOLATE, FERRITIN, TIBC, IRON, RETICCTPCT in the last 72 hours. Urine analysis:    Component Value Date/Time   COLORURINE YELLOW 04/19/2021 1039   APPEARANCEUR CLEAR 04/19/2021 1039   LABSPEC 1.008 04/19/2021 1039   PHURINE 5.0 04/19/2021 1039   GLUCOSEU NEGATIVE 04/19/2021 1039   HGBUR NEGATIVE 04/19/2021 1039   BILIRUBINUR NEGATIVE 04/19/2021 1039   KETONESUR NEGATIVE 04/19/2021 1039   PROTEINUR NEGATIVE 04/19/2021 1039   UROBILINOGEN 0.2 07/22/2014 1330   NITRITE NEGATIVE 04/19/2021 1039   LEUKOCYTESUR NEGATIVE 04/19/2021 1039   Sepsis Labs: Invalid input(s): PROCALCITONIN, LACTICIDVEN  Microbiology: Recent Results (from the past 240 hour(s))  Resp Panel by RT-PCR (Flu A&B, Covid) Nasopharyngeal Swab     Status: None   Collection Time: 04/18/21  7:28 PM   Specimen: Nasopharyngeal Swab; Nasopharyngeal(NP) swabs in vial transport medium  Result Value Ref Range Status   SARS Coronavirus 2 by RT PCR NEGATIVE NEGATIVE Final    Comment: (NOTE) SARS-CoV-2 target nucleic acids are NOT DETECTED.  The SARS-CoV-2 RNA is generally detectable in upper respiratory specimens during the acute phase of infection. The lowest concentration of SARS-CoV-2 viral copies this assay can detect is 138 copies/mL. A negative result does not preclude SARS-Cov-2 infection and should not be used as the sole basis for treatment or other patient management decisions. A negative result may occur  with  improper specimen collection/handling, submission of specimen other than nasopharyngeal swab, presence of viral mutation(s) within the areas targeted by this assay, and inadequate number of viral copies(<138 copies/mL). A negative result must be combined with clinical observations, patient history, and epidemiological information. The expected result is Negative.  Fact Sheet for Patients:  04/20/21  Fact Sheet for Healthcare Providers:  BloggerCourse.com  This test is no t yet approved or cleared by the [place] FDA and  has been authorized for detection and/or diagnosis of SARS-CoV-2 by FDA under an Emergency Use Authorization (EUA). This EUA will remain  in effect (meaning this test can be used) for the duration of the COVID-19 declaration under Section 564(b)(1) of the Act, 21 U.S.C.section 360bbb-3(b)(1), unless the authorization is terminated  or revoked sooner.       Influenza A by PCR NEGATIVE NEGATIVE Final   Influenza B by  PCR NEGATIVE NEGATIVE Final    Comment: (NOTE) The Xpert Xpress SARS-CoV-2/FLU/RSV plus assay is intended as an aid in the diagnosis of influenza from Nasopharyngeal swab specimens and should not be used as a sole basis for treatment. Nasal washings and aspirates are unacceptable for Xpert Xpress SARS-CoV-2/FLU/RSV testing.  Fact Sheet for Patients: BloggerCourse.com  Fact Sheet for Healthcare Providers: SeriousBroker.it  This test is not yet approved or cleared by the [place] FDA and has been authorized for detection and/or diagnosis of SARS-CoV-2 by FDA under an Emergency Use Authorization (EUA). This EUA will remain in effect (meaning this test can be used) for the duration of the COVID-19 declaration under Section 564(b)(1) of the Act, 21 U.S.C. section 360bbb-3(b)(1), unless the authorization is terminated  or revoked.  Performed at [facility], [address] [place], [place] [address]   Gastrointestinal Panel by PCR , Stool     Status: None   Collection Time: 04/19/21  7:36 AM   Specimen: Stool  Result Value Ref Range Status   Campylobacter species NOT DETECTED NOT DETECTED Final   Plesimonas shigelloides NOT DETECTED NOT DETECTED Final   Salmonella species NOT DETECTED NOT DETECTED Final   Yersinia enterocolitica NOT DETECTED NOT DETECTED Final   Vibrio species NOT DETECTED NOT DETECTED Final   Vibrio cholerae NOT DETECTED NOT DETECTED Final   Enteroaggregative E coli (EAEC) NOT DETECTED NOT DETECTED Final   Enteropathogenic E coli (EPEC) NOT DETECTED NOT DETECTED Final   Enterotoxigenic E coli (ETEC) NOT DETECTED NOT DETECTED Final   Shiga like toxin producing E coli (STEC) NOT DETECTED NOT DETECTED Final   Shigella/Enteroinvasive E coli (EIEC) NOT DETECTED NOT DETECTED Final   Cryptosporidium NOT DETECTED NOT DETECTED Final   Cyclospora cayetanensis NOT DETECTED NOT DETECTED Final   Entamoeba histolytica NOT DETECTED NOT DETECTED Final   Giardia lamblia NOT DETECTED NOT DETECTED Final   Adenovirus F40/41 NOT DETECTED NOT DETECTED Final   Astrovirus NOT DETECTED NOT DETECTED Final   Norovirus GI/GII NOT DETECTED NOT DETECTED Final   Rotavirus A NOT DETECTED NOT DETECTED Final   Sapovirus (I, II, IV, and V) NOT DETECTED NOT DETECTED Final    Comment: Performed at [facility], [address] [place], [place] [address]  C Difficile Quick Screen w PCR reflex     Status: None   Collection Time: 04/19/21  7:36 AM   Specimen: Stool  Result Value Ref Range Status   C Diff antigen NEGATIVE NEGATIVE Final   C Diff toxin NEGATIVE NEGATIVE Final   C Diff interpretation No C. difficile detected.  Final    Comment: Performed at [facility], [address], [place], [place] [address]  Culture, body fluid w Gram Stain-bottle     Status: None (Preliminary result)    Collection Time: 04/20/21 10:46 AM   Specimen: Fluid  Result Value Ref Range Status   Specimen Description FLUID  Final   Special Requests  RIGHT LUNG  Final   Culture   Final    NO GROWTH 3 DAYS Performed at [facility], [address], [place], [place] [address]    Report Status PENDING  Incomplete  Gram stain     Status: None   Collection Time: 04/20/21 10:46 AM   Specimen: Fluid  Result Value Ref Range Status   Specimen Description FLUID  Final   Special Requests  RIGHT LUNG  Final   Gram Stain   Final  WBC PRESENT,BOTH PMN AND MONONUCLEAR NO ORGANISMS SEEN CYTOSPIN SMEAR Performed at [facility], [address] [place], [place] [address]    Report Status 04/20/2021 FINAL  Final    Radiology Studies: No results found.   Krystian Ferrentino [facility]  If 7PM-7AM, please contact night-coverage [email or web address] 04/23/2021, 5:06 PM

## 2021-04-24 DIAGNOSIS — N179 Acute kidney failure, unspecified: Secondary | ICD-10-CM

## 2021-04-24 DIAGNOSIS — E43 Unspecified severe protein-calorie malnutrition: Secondary | ICD-10-CM | POA: Diagnosis not present

## 2021-04-24 DIAGNOSIS — N183 Chronic kidney disease, stage 3 unspecified: Secondary | ICD-10-CM | POA: Diagnosis not present

## 2021-04-24 DIAGNOSIS — K767 Hepatorenal syndrome: Secondary | ICD-10-CM | POA: Diagnosis not present

## 2021-04-24 DIAGNOSIS — Z515 Encounter for palliative care: Secondary | ICD-10-CM | POA: Diagnosis not present

## 2021-04-24 DIAGNOSIS — R531 Weakness: Secondary | ICD-10-CM | POA: Diagnosis not present

## 2021-04-24 DIAGNOSIS — J9621 Acute and chronic respiratory failure with hypoxia: Secondary | ICD-10-CM | POA: Diagnosis not present

## 2021-04-24 NOTE — Progress Notes (Signed)
Daily Progress Note   Patient Name: [name] [name].       Date: 04/24/2021 DOB: [birth year]  Age: 70 y.o. MRN#: [id number] Attending Physician: Almon Hercules, MD Primary Care Physician: Marcine Matar, MD Admit Date: 04/18/2021  Reason for Consultation/Follow-up: Establishing goals of care  Patient Profile/HPI:   70 y.o. male  with past medical history of CHF, A. fib, COPD, CVA, CKD, CAD, and HTN, admitted on 04/18/2021 with terminal swelling, weight loss, poor p.o. intake, diarrhea, acute on chronic respiratory failure with hypoxia.  Work-up revealed a large right pleural effusion, liver cirrhosis with ascites, hepatorenal and cardiorenal syndrome.  He had thoracentesis and paracentesis with very rapid reaccumulation of all fluids.  His recovery is complicated by ongoing hypotension.  He is not eating. Palliative medicine consulted for goals of care discussion.  Subjective: Medical records reviewed. Laurie is resting upon exam, awakes easily to my voice. He denies pain or distress. Noted excessive secretions and discussed option of PRN robinul. Patient is agreeable to trying this. We discussed the option of scheduling robinul and he does not feel like he needs this quite yet. Encouraged patient to inform the care team of any needs he may have to ensure his comfort.   Questions and concerns addressed. Emotional support provided.  Review of Systems  All other systems reviewed and are negative.   Physical Exam Vitals and nursing note reviewed.  Cardiovascular:     Rate and Rhythm: Normal rate.  Pulmonary:     Effort: Pulmonary effort is normal. No respiratory distress.  Skin:    General: Skin is warm and dry.  Neurological:     Mental Status: He is alert.            Vital Signs: BP  (!) 106/58 (BP Location: Left Arm)   Pulse 67   Temp (!) 97.5 F (36.4 C) (Oral)   Resp 17   Ht 5\' 11"  (1.803 m)   Wt 64.4 kg   SpO2 92%   BMI 19.80 kg/m  SpO2: SpO2: 92 % O2 Device: O2 Device: Nasal Cannula O2 Flow Rate: O2 Flow Rate (L/min): 3 L/min  Intake/output summary:  Intake/Output Summary (Last 24 hours) at 04/24/2021 0854 Last data filed at 04/23/2021 0907 Gross per 24 hour  Intake 120 ml  Output --  Net 120 ml    LBM: Last BM Date: 04/23/21 Baseline Weight: Weight: 71.2 kg Most recent weight: Weight: 64.4 kg       Palliative Assessment/Data: PPS: 20%      Patient Active Problem List   Diagnosis Date Noted   Protein-calorie malnutrition, severe 04/21/2021   Acute on chronic respiratory failure with hypoxia (HCC) 04/19/2021   Ascites 04/18/2021   Pleural effusion on right 04/18/2021   Hypoxemia 04/18/2021   CKD (chronic kidney disease) stage 3, GFR 30-59 ml/min (HCC) 04/18/2021   PAF (paroxysmal atrial fibrillation) (HCC) 04/18/2021   Abnormal CT of liver 04/18/2021   Diarrhea 04/18/2021   Essential hypertension 04/18/2021   Unintended weight loss 12/11/2020   Hypoxemic respiratory failure, chronic (HCC) 12/11/2020   Renal insufficiency 12/11/2020   Colon cancer screening 09/14/2015   Chronic systolic congestive heart failure (HCC) 09/14/2015   Acute systolic congestive heart failure (HCC) 08/12/2014   COPD bronchitis 08/12/2014   Alcohol abuse 08/12/2014   Cigarette nicotine dependence in remission 08/12/2014   Hyperlipidemia 08/12/2014   COLD (chronic obstructive lung disease) (HCC)    Thrombocytopenia (HCC)    QT prolongation    Korsakoff syndrome (HCC)    Chronic alcohol abuse    Altered consciousness    Shortness of breath    SOB (shortness of breath)    Atrial fibrillation (HCC) 07/24/2014   CHF exacerbation (HCC) 07/22/2014   Acute respiratory failure with hypoxia (HCC)    Alcohol withdrawal (HCC)    CAP (community acquired  pneumonia)    Acute respiratory failure (HCC)    Dyspnea    CHF (congestive heart failure) (HCC) 07/21/2014    Palliative Care Assessment & Plan    Assessment/Recommendations/Plan -End of life care Continue current comfort measures Discussed administration of PRN robinul with RN [place] Await bed at [facility]  Code Status: DNR  Prognosis:  < 2 weeks  Discharge Planning: Hospice facility  Care plan was discussed with patient   Total time: 25 minutes Greater than 50% of this time was spent in counseling and coordinating care related to the above assessment and plan.  Richardson Dopp, PA-C Palliative Medicine Team Team phone # [phone]  Thank you for allowing the Palliative Medicine Team to assist in the care of this patient. Please utilize secure chat with additional questions, if there is no response within 30 minutes please call the above phone number.  Palliative Medicine Team providers are available by phone from 7am to 7pm daily and can be reached through the team cell phone.  Should this patient require assistance outside of these hours, please call the patient's attending physician.

## 2021-04-24 NOTE — Progress Notes (Signed)
[facility] 3E01 [facility]) Hospital Liaison Note   Unfortunately, [facility] is not able to offer a room today. Family and [facility] Manager aware hospital liaison will follow up tomorrow or sooner if a room becomes available.    Please do not hesitate to call with any hospice related questions.    Thank you for the opportunity to participate in this patient's care.   Bobbie "Einar Gip, RN, BSN [facility] Liaison [phone]

## 2021-04-24 NOTE — TOC Progression Note (Signed)
Transition of Care (TOC) - Progression Note    Patient Details  Name: [name] [name]. MRN: [id number] Date of Birth: 09-16-1950  Transition of Care [facility]) CM/SW Contact  Mearl Latin, LCSW Phone Number: 04/24/2021, 12:01 PM  Clinical Narrative:    CSW continuing to follow once bed is available at [facility].         Expected Discharge Plan and Services                                                 Social Determinants of Health (SDOH) Interventions    Readmission Risk Interventions No flowsheet data found.

## 2021-04-24 NOTE — Progress Notes (Signed)
PROGRESS NOTE  [name] [name]. ATF:[id number] DOB: May 12, 1951   PCP: Marcine Matar, MD  Patient is from: Home.  Lives with friend.  Uses walker at baseline.  DOA: 04/18/2021 LOS: 6  Chief complaints:  Chief Complaint  Patient presents with   Shortness of Breath     Brief Narrative / Interim history: 70 year old M with PMH of combined CHF, PAF, COPD, CVA, CKD-CAD and HTN presenting with abdominal swelling, diarrhea, 30 pound weight loss and poor p.o. intake, and admitted with acute on chronic RF with hypoxia in the setting of large right pleural effusion, liver cirrhosis with ascites and AKI.  He underwent thoracocentesis with removal of 1.5 L transudative and culture negative fluid.  He also underwent paracentesis with removal of 9 L.  He has markedly elevated AFP and CA 19-9.  C. difficile negative.  CT of his liver on abdomen with advanced cirrhotic changes, portal vein thrombosis, intrahepatic cavernous transformation, large volume ascites, large right pleural effusion but no evidence of hepatic lesion to suggest hepatoma.   TTE with LVEF of 35 to 40%, G2-DD, GH and severe aortic stenosis.  Cardiology consulted and did not have much to offer given his other comorbidities and poor prognosis.  Patient has progressive decline goal of care discussion initiated.  Eventually, transitioned to full comfort care on 04/22/2021.  Plan for transfer to [facility] when bed available.   Subjective: Seen and examined earlier this morning and later this afternoon.  No major events overnight of this morning.  Lying in bed.  No complaints.  Patient's brother and sister-in-law at bedside.  Objective: Vitals:   04/22/21 2000 04/23/21 0532 04/23/21 1143 04/23/21 1916  BP: (!) 73/48 (!) 85/57 (!) 92/55 (!) 106/58  Pulse: 80 75 79 67  Resp: 19 18 17 17   Temp: (!) 97.5 F (36.4 C) 97.6 F (36.4 C) 98 F (36.7 C) (!) 97.5 F (36.4 C)  TempSrc: Oral Oral Oral Oral  SpO2: 94% 95% 96% 92%   Weight:      Height:        Intake/Output Summary (Last 24 hours) at 04/24/2021 1758 Last data filed at 04/24/2021 1529 Gross per 24 hour  Intake 60 ml  Output --  Net 60 ml   Filed Weights   04/19/21 0734 04/21/21 0412 04/22/21 0512  Weight: 71.2 kg 60.4 kg 64.4 kg    Examination:  GENERAL: Frail and chronically ill-appearing.  No distress. HEENT: Temporal wasting. RESP: 92% on 3 L.  No IWOB.   ABD/GI/GU: Notable distended abdomen from ascites MSK/EXT: Significant muscle mass and subcu fat loss. NEURO: Awake and alert. Oriented appropriately.  No apparent focal neuro deficit. PSYCH: Calm. Normal affect.   Procedures:  10/24-US guided paracentesis with removal of 9 L 10/25-US guided thoracocentesis of right pleural effusion with 1.5 L transudative fluid  Microbiology summarized: COVID-19 and influenza PCR nonreactive. C. difficile and GI panel negative Pleural fluid culture negative  Assessment & Plan: End-of-life care/full comfort care/DNR/DNI -Appreciate help by palliative care-symptoms well managed. -Plan for transfer to [facility] when bed available.  Acute on chronic RF with hypoxia-multifactorial including pleural effusion with lung collapse, ascites, combined CHF and aortic stenosis.  On 2 L at baseline. -Comfortable on 3 L.  Recurrent large right-sided pleural effusion with right lower lobe collapse-likely from CHF, liver cirrhosis and malnutrition -S/p 04/24/21 guided thoracocentesis of right pleural effusion with 1.5 L transudative and culture negative fluid  Acute on chronic combined CHF: TTE with LVEF  of 35 to 40%, G2-DD, GH and severe aortic stenosis.  He presents with significant effusion.  Hypotension precludes diuretics.  Cardiology signed off  Severe aortic stenosis: Not a candidate for surgical repair.  Cardiology signed off  Liver cirrhosis with ascites/markedly elevated AFP and CA 19-9 concerning for malignancy: CT liver and abdomen with cirrhosis,  large ascites, large pleural effusion, portal vein thrombosis and intrahepatic cavernous transformation.  -GI signed off.  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  Progressively worse.   Diarrhea-C. difficile and GI panel negative.  Could be from fluid overload and possible malignancy -Imodium as needed   Paroxysmal A. fib: CHA2DS2-VASc score 5.  Not on [facility] prior to admission   Hypotension/history of essential hypertension- -Hold antihypertensive meds.  Thrombocytopenia: Slowly dropping.   Goal of care counseling-for comfort care as above.   Severe malnutrition: As evidenced by significant muscle mass and subcu fat loss Body mass index is 19.8 kg/m. Nutrition Problem: Severe Malnutrition Etiology: chronic illness (COPD) Signs/Symptoms: severe muscle depletion, severe fat depletion, percent weight loss Percent weight loss: 14 % Interventions: Boost Breeze, MVI, Liberalize Diet   DVT prophylaxis:    On IV heparin  Code Status: DNR/DNI Family Communication: Updated patient's brother and sister-in-law at bedside. Level of care: Palliative Care Status is: Inpatient  Remains inpatient appropriate because: Safe disposition/residential hospice at [facility]       Consultants:  Gastroenterology Cardiology Palliative medicine   Sch Meds:  Scheduled Meds:  fluticasone furoate-vilanterol  1 puff Inhalation Daily    HYDROmorphone (DILAUDID) injection  0.5 mg Intravenous Q6H   sodium chloride flush  3 mL Intravenous Q12H   traZODone  50 mg Oral QHS   Continuous Infusions:  sodium chloride     PRN Meds:.sodium chloride, acetaminophen **OR** acetaminophen, antiseptic oral rinse, glycopyrrolate **OR** glycopyrrolate  **OR** glycopyrrolate, haloperidol **OR** haloperidol **OR** haloperidol lactate, HYDROmorphone (DILAUDID) injection, ipratropium-albuterol, loperamide, LORazepam **OR** LORazepam **OR** LORazepam, ondansetron **OR** ondansetron (ZOFRAN) IV, polyvinyl alcohol, sodium chloride flush  Antimicrobials: Anti-infectives (From admission, onward)    None        I have personally reviewed the following labs and images: CBC: Recent Labs  Lab 04/18/21 1558 04/19/21 0620 04/20/21 0428 04/21/21 0334 04/22/21 0229  WBC 9.2 7.2 6.6 6.4 9.2  NEUTROABS 8.0*  --   --   --   --   HGB 13.4 10.9* 12.0* 11.0* 11.5*  HCT 41.5 34.1* 36.9* 33.6* 34.4*  MCV 97.4 98.3 96.6 94.9 93.2  PLT 171 130* 126* PLATELET CLUMPS NOTED ON SMEAR, UNABLE TO ESTIMATE 106*   BMP &GFR Recent Labs  Lab 04/18/21 1558 04/19/21 0620 04/20/21 0428 04/21/21 0334 04/22/21 0229  NA 136 136 135 136 132*  K 4.5 4.5 4.6 4.4 4.3  CL 104 106 105 105 104  CO2 20* 19* 20* 23 20*  GLUCOSE 83 85 107* 109* 113*  BUN 44* 43* 44* 49* 52*  CREATININE 1.86* 1.74* 1.83* 1.94* 2.13*  CALCIUM 9.2 8.2* 8.4* 8.4* 8.4*  MG 2.1  --   --  2.0 1.9  PHOS  --   --   --  3.7 2.8   Estimated Creatinine Clearance: 29 mL/min (A) (by C-G formula based on SCr of 2.13 mg/dL (H)). Liver & Pancreas: Recent Labs  Lab 04/18/21 1558 04/19/21 9604  04/20/21 0428 04/21/21 0334 04/22/21 0229  AST 50* 45* 44*  --  57*  ALT 28 23 24   --  26  ALKPHOS 171* 135* 142*  --  148*  BILITOT 1.1 1.1 0.9  --  0.8  PROT 7.2 5.9* 6.1*  --  5.9*  ALBUMIN 2.8* 2.2* 2.1* 2.4* 2.4*   No results for input(s): LIPASE, AMYLASE in the last 168 hours. Recent Labs  Lab 04/18/21 1558  AMMONIA 26   Diabetic: No results for input(s): HGBA1C in the last 72 hours. No results for input(s): GLUCAP in the last 168 hours. Cardiac Enzymes: No results for input(s): CKTOTAL, CKMB, CKMBINDEX, TROPONINI in the last 168 hours. No results for input(s): PROBNP in the last 8760  hours. Coagulation Profile: Recent Labs  Lab 04/18/21 1558  INR 1.1   Thyroid Function Tests: No results for input(s): TSH, T4TOTAL, FREET4, T3FREE, THYROIDAB in the last 72 hours. Lipid Profile: No results for input(s): CHOL, HDL, LDLCALC, TRIG, CHOLHDL, LDLDIRECT in the last 72 hours. Anemia Panel: No results for input(s): VITAMINB12, FOLATE, FERRITIN, TIBC, IRON, RETICCTPCT in the last 72 hours. Urine analysis:    Component Value Date/Time   COLORURINE YELLOW 04/19/2021 1039   APPEARANCEUR CLEAR 04/19/2021 1039   LABSPEC 1.008 04/19/2021 1039   PHURINE 5.0 04/19/2021 1039   GLUCOSEU NEGATIVE 04/19/2021 1039   HGBUR NEGATIVE 04/19/2021 1039   BILIRUBINUR NEGATIVE 04/19/2021 1039   KETONESUR NEGATIVE 04/19/2021 1039   PROTEINUR NEGATIVE 04/19/2021 1039   UROBILINOGEN 0.2 07/22/2014 1330   NITRITE NEGATIVE 04/19/2021 1039   LEUKOCYTESUR NEGATIVE 04/19/2021 1039   Sepsis Labs: Invalid input(s): PROCALCITONIN, LACTICIDVEN  Microbiology: Recent Results (from the past 240 hour(s))  Resp Panel by RT-PCR (Flu A&B, Covid) Nasopharyngeal Swab     Status: None   Collection Time: 04/18/21  7:28 PM   Specimen: Nasopharyngeal Swab; Nasopharyngeal(NP) swabs in vial transport medium  Result Value Ref Range Status   SARS Coronavirus 2 by RT PCR NEGATIVE NEGATIVE Final    Comment: (NOTE) SARS-CoV-2 target nucleic acids are NOT DETECTED.  The SARS-CoV-2 RNA is generally detectable in upper respiratory specimens during the acute phase of infection. The lowest concentration of SARS-CoV-2 viral copies this assay can detect is 138 copies/mL. A negative result does not preclude SARS-Cov-2 infection and should not be used as the sole basis for treatment or other patient management decisions. A negative result may occur with  improper specimen collection/handling, submission of specimen other than nasopharyngeal swab, presence of viral mutation(s) within the areas targeted by this assay,  and inadequate number of viral copies(<138 copies/mL). A negative result must be combined with clinical observations, patient history, and epidemiological information. The expected result is Negative.  Fact Sheet for Patients:  04/20/21  Fact Sheet for Healthcare Providers:  BloggerCourse.com  This test is no t yet approved or cleared by the [place] FDA and  has been authorized for detection and/or diagnosis of SARS-CoV-2 by FDA under an Emergency Use Authorization (EUA). This EUA will remain  in effect (meaning this test can be used) for the duration of the COVID-19 declaration under Section 564(b)(1) of the Act, 21 U.S.C.section 360bbb-3(b)(1), unless the authorization is terminated  or revoked sooner.       Influenza A by PCR NEGATIVE NEGATIVE Final   Influenza B by PCR NEGATIVE NEGATIVE Final    Comment: (NOTE) The Xpert Xpress SARS-CoV-2/FLU/RSV plus assay is intended as an aid in the diagnosis of influenza from Nasopharyngeal swab specimens and should  not be used as a sole basis for treatment. Nasal washings and aspirates are unacceptable for Xpert Xpress SARS-CoV-2/FLU/RSV testing.  Fact Sheet for Patients: BloggerCourse.com  Fact Sheet for Healthcare Providers: SeriousBroker.it  This test is not yet approved or cleared by the [place] FDA and has been authorized for detection and/or diagnosis of SARS-CoV-2 by FDA under an Emergency Use Authorization (EUA). This EUA will remain in effect (meaning this test can be used) for the duration of the COVID-19 declaration under Section 564(b)(1) of the Act, 21 U.S.C. section 360bbb-3(b)(1), unless the authorization is terminated or revoked.  Performed at [facility], [address] [place], [place] [address]   Gastrointestinal Panel by PCR , Stool     Status: None   Collection Time: 04/19/21  7:36  AM   Specimen: Stool  Result Value Ref Range Status   Campylobacter species NOT DETECTED NOT DETECTED Final   Plesimonas shigelloides NOT DETECTED NOT DETECTED Final   Salmonella species NOT DETECTED NOT DETECTED Final   Yersinia enterocolitica NOT DETECTED NOT DETECTED Final   Vibrio species NOT DETECTED NOT DETECTED Final   Vibrio cholerae NOT DETECTED NOT DETECTED Final   Enteroaggregative E coli (EAEC) NOT DETECTED NOT DETECTED Final   Enteropathogenic E coli (EPEC) NOT DETECTED NOT DETECTED Final   Enterotoxigenic E coli (ETEC) NOT DETECTED NOT DETECTED Final   Shiga like toxin producing E coli (STEC) NOT DETECTED NOT DETECTED Final   Shigella/Enteroinvasive E coli (EIEC) NOT DETECTED NOT DETECTED Final   Cryptosporidium NOT DETECTED NOT DETECTED Final   Cyclospora cayetanensis NOT DETECTED NOT DETECTED Final   Entamoeba histolytica NOT DETECTED NOT DETECTED Final   Giardia lamblia NOT DETECTED NOT DETECTED Final   Adenovirus F40/41 NOT DETECTED NOT DETECTED Final   Astrovirus NOT DETECTED NOT DETECTED Final   Norovirus GI/GII NOT DETECTED NOT DETECTED Final   Rotavirus A NOT DETECTED NOT DETECTED Final   Sapovirus (I, II, IV, and V) NOT DETECTED NOT DETECTED Final    Comment: Performed at [facility], [address] [place], [place] [address]  C Difficile Quick Screen w PCR reflex     Status: None   Collection Time: 04/19/21  7:36 AM   Specimen: Stool  Result Value Ref Range Status   C Diff antigen NEGATIVE NEGATIVE Final   C Diff toxin NEGATIVE NEGATIVE Final   C Diff interpretation No C. difficile detected.  Final    Comment: Performed at [facility], [address], [place], [place] [address]  Culture, body fluid w Gram Stain-bottle     Status: None (Preliminary result)   Collection Time: 04/20/21 10:46 AM   Specimen: Fluid  Result Value Ref Range Status   Specimen Description FLUID  Final   Special Requests  RIGHT LUNG  Final   Culture   Final    NO  GROWTH 4 DAYS Performed at [facility], [address], [place], [place] [address]    Report Status PENDING  Incomplete  Gram stain     Status: None   Collection Time: 04/20/21 10:46 AM   Specimen: Fluid  Result Value Ref Range Status   Specimen Description FLUID  Final   Special Requests  RIGHT LUNG  Final   Gram Stain   Final    WBC PRESENT,BOTH PMN AND MONONUCLEAR NO ORGANISMS SEEN CYTOSPIN SMEAR Performed at [facility], [address], [place], [place] [address]    Report Status 04/20/2021 FINAL  Final    Radiology Studies: No results found.   Kynnedi Zweig T. Tenika Keeran [facility]  If 7PM-7AM, please contact night-coverage [email or web address] 04/24/2021, 5:58 PM

## 2021-04-25 DIAGNOSIS — K767 Hepatorenal syndrome: Secondary | ICD-10-CM | POA: Diagnosis not present

## 2021-04-25 DIAGNOSIS — N179 Acute kidney failure, unspecified: Secondary | ICD-10-CM | POA: Diagnosis not present

## 2021-04-25 DIAGNOSIS — J9621 Acute and chronic respiratory failure with hypoxia: Secondary | ICD-10-CM | POA: Diagnosis not present

## 2021-04-25 DIAGNOSIS — E43 Unspecified severe protein-calorie malnutrition: Secondary | ICD-10-CM | POA: Diagnosis not present

## 2021-04-25 DIAGNOSIS — N183 Chronic kidney disease, stage 3 unspecified: Secondary | ICD-10-CM | POA: Diagnosis not present

## 2021-04-25 DIAGNOSIS — Z515 Encounter for palliative care: Secondary | ICD-10-CM | POA: Diagnosis not present

## 2021-04-25 LAB — CULTURE, BODY FLUID W GRAM STAIN -BOTTLE: Culture: NO GROWTH

## 2021-04-25 NOTE — Progress Notes (Signed)
Daily Progress Note   Patient Name: [name] [name].       Date: 04/25/2021 DOB: 1951-02-15  Age: 70 y.o. MRN#: [id number] Attending Physician: Almon Hercules, MD Primary Care Physician: Marcine Matar, MD Admit Date: 04/18/2021  Reason for Consultation/Follow-up: Establishing goals of care  Patient Profile/HPI:   70 y.o. male  with past medical history of CHF, A. fib, COPD, CVA, CKD, CAD, and HTN, admitted on 04/18/2021 with terminal swelling, weight loss, poor p.o. intake, diarrhea, acute on chronic respiratory failure with hypoxia.  Work-up revealed a large right pleural effusion, liver cirrhosis with ascites, hepatorenal and cardiorenal syndrome.  He had thoracentesis and paracentesis with very rapid reaccumulation of all fluids.  His recovery is complicated by ongoing hypotension.  He is not eating. Palliative medicine consulted for goals of care discussion.  Subjective: Medical records reviewed. Discussed with RN. Fisher tells me he is uncomfortable and thirsty. Assisted with sip of water and repositioning. He appears frustrated and states he continues to have discomfort in his back. He is awaiting his scheduled dose of dilaudid.   Questions and concerns addressed. Emotional support provided.  Review of Systems  All other systems reviewed and are negative.   Physical Exam Vitals and nursing note reviewed.  Cardiovascular:     Rate and Rhythm: Normal rate.  Pulmonary:     Effort: Pulmonary effort is normal. No respiratory distress.  Skin:    General: Skin is warm and dry.  Neurological:     Mental Status: He is alert.            Vital Signs: BP (!) 85/55 (BP Location: Left Arm)   Pulse 91   Temp 97.9 F (36.6 C) (Oral)   Resp 17   Ht 5\' 11"  (1.803 m)   Wt 64.4 kg   SpO2  95%   BMI 19.80 kg/m  SpO2: SpO2: 95 % O2 Device: O2 Device: Nasal Cannula O2 Flow Rate: O2 Flow Rate (L/min): 3 L/min  Intake/output summary:  Intake/Output Summary (Last 24 hours) at 04/25/2021 0856 Last data filed at 04/24/2021 1529 Gross per 24 hour  Intake 60 ml  Output --  Net 60 ml    LBM: Last BM Date: 04/23/21 Baseline Weight: Weight: 71.2 kg Most recent weight: Weight: 64.4 kg  Palliative Assessment/Data: PPS: 20%      Patient Active Problem List   Diagnosis Date Noted   AKI (acute kidney injury) (HCC)    Generalized weakness    Protein-calorie malnutrition, severe 04/21/2021   Acute on chronic respiratory failure with hypoxia (HCC) 04/19/2021   Ascites 04/18/2021   Pleural effusion on right 04/18/2021   Hypoxemia 04/18/2021   CKD (chronic kidney disease) stage 3, GFR 30-59 ml/min (HCC) 04/18/2021   PAF (paroxysmal atrial fibrillation) (HCC) 04/18/2021   Abnormal CT of liver 04/18/2021   Diarrhea 04/18/2021   Essential hypertension 04/18/2021   Unintended weight loss 12/11/2020   Hypoxemic respiratory failure, chronic (HCC) 12/11/2020   Renal insufficiency 12/11/2020   Colon cancer screening 09/14/2015   Chronic systolic congestive heart failure (HCC) 09/14/2015   Acute systolic congestive heart failure (HCC) 08/12/2014   COPD bronchitis 08/12/2014   Alcohol abuse 08/12/2014   Cigarette nicotine dependence in remission 08/12/2014   Hyperlipidemia 08/12/2014   COLD (chronic obstructive lung disease) (HCC)    Thrombocytopenia (HCC)    QT prolongation    Korsakoff syndrome (HCC)    Chronic alcohol abuse    Altered consciousness    Shortness of breath    SOB (shortness of breath)    Atrial fibrillation (HCC) 07/24/2014   CHF exacerbation (HCC) 07/22/2014   Acute respiratory failure with hypoxia (HCC)    Alcohol withdrawal (HCC)    CAP (community acquired pneumonia)    Acute respiratory failure (HCC)    Dyspnea    CHF (congestive heart  failure) (HCC) 07/21/2014    Palliative Care Assessment & Plan    Assessment/Recommendations/Plan -End of life care Continue current comfort measures, including scheduled and PRN dilaudid for pain No BP goals with comfort-focused care. Discussed with RN Patient remains stable for transfer to [facility] when bed is available  Code Status: DNR  Prognosis:  < 2 weeks  Discharge Planning: Hospice facility  Care plan was discussed with patient   Total time: 15 minutes Greater than 50% of this time was spent in counseling and coordinating care related to the above assessment and plan.  Richardson Dopp, PA-C Palliative Medicine Team Team phone # [phone]  Thank you for allowing the Palliative Medicine Team to assist in the care of this patient. Please utilize secure chat with additional questions, if there is no response within 30 minutes please call the above phone number.  Palliative Medicine Team providers are available by phone from 7am to 7pm daily and can be reached through the team cell phone.  Should this patient require assistance outside of these hours, please call the patient's attending physician.

## 2021-04-25 NOTE — Plan of Care (Signed)
Patient progressing 

## 2021-04-25 NOTE — Progress Notes (Signed)
PROGRESS NOTE  [name] [name]. DGU:[id number] DOB: [birth year]   PCP: [name] Matar, MD  Patient is from: Home.  Lives with friend.  Uses walker at baseline.  DOA: 04/18/2021 LOS: 7  Chief complaints:  Chief Complaint  Patient presents with   Shortness of Breath     Brief Narrative / Interim history: 70 year old M with PMH of combined CHF, PAF, COPD, CVA, CKD-CAD and HTN presenting with abdominal swelling, diarrhea, 30 pound weight loss and poor p.o. intake, and admitted with acute on chronic RF with hypoxia in the setting of large right pleural effusion, liver cirrhosis with ascites and AKI.  He underwent thoracocentesis with removal of 1.5 L transudative and culture negative fluid.  He also underwent paracentesis with removal of 9 L.  He has markedly elevated AFP and CA 19-9.  C. difficile negative.  CT of his liver on abdomen with advanced cirrhotic changes, portal vein thrombosis, intrahepatic cavernous transformation, large volume ascites, large right pleural effusion but no evidence of hepatic lesion to suggest hepatoma.   TTE with LVEF of 35 to 40%, G2-DD, GH and severe aortic stenosis.  Cardiology consulted and did not have much to offer given his other comorbidities and poor prognosis.  Patient has progressive decline goal of care discussion initiated.  Eventually, transitioned to full comfort care on 04/22/2021.  Plan for transfer to [facility] when bed available.   Subjective: Seen and examined earlier this morning.  No major events overnight of this morning.  No complaints but feels and looks weak.   Objective: Vitals:   04/23/21 1916 04/24/21 1913 04/25/21 0755 04/25/21 0853  BP: (!) 106/58 (!) 91/57 (!) 85/55   Pulse: 67 88 91   Resp: 17 17 17    Temp: (!) 97.5 F (36.4 C)  97.9 F (36.6 C)   TempSrc: Oral Oral Oral   SpO2: 92% 96% 95% 95%  Weight:      Height:        Intake/Output Summary (Last 24 hours) at 04/25/2021 1225 Last data filed at 04/25/2021  1050 Gross per 24 hour  Intake 60 ml  Output --  Net 60 ml   Filed Weights   04/19/21 0734 04/21/21 0412 04/22/21 0512  Weight: 71.2 kg 60.4 kg 64.4 kg    Examination:  GENERAL: Frail and chronically ill-appearing.  Looks weak. HEENT: MMM.  Vision and hearing grossly intact.  RESP: 95% on 3 L.  No IWOB.   ABD/GI/GU: Notable distended abdomen from ascites. MSK/EXT:  Moves extremities.  Significant muscle mass and subcu fat loss. SKIN: no apparent skin lesion or wound NEURO: Awake and alert. Oriented fairly.  No apparent focal neuro deficit. PSYCH: Calm. Normal affect.  Looks weak.  Procedures:  10/24-US guided paracentesis with removal of 9 L 10/25-US guided thoracocentesis of right pleural effusion with 1.5 L transudative fluid  Microbiology summarized: COVID-19 and influenza PCR nonreactive. C. difficile and GI panel negative Pleural fluid culture negative  Assessment & Plan: End-of-life care/full comfort care/DNR/DNI -Palliative care following.  Symptoms well managed. -Plan for transfer to [facility] when bed available.  Acute on chronic RF with hypoxia-multifactorial including pleural effusion with lung collapse, ascites, combined CHF and aortic stenosis.  -Comfortable on 3 L.  Recurrent large right-sided pleural effusion with right lower lobe collapse-likely from CHF, liver cirrhosis and malnutrition -S/p 04/24/21 guided thoracocentesis of right pleural effusion with 1.5 L transudative and culture negative fluid  Acute on chronic combined CHF: TTE with LVEF of 35 to  40%, G2-DD, GH and severe aortic stenosis.  He presents with significant effusion.  Hypotension precludes diuretics.  Cardiology signed off  Severe aortic stenosis: Not a candidate for surgical repair.  Cardiology signed off  Liver cirrhosis with ascites/markedly elevated AFP and CA 19-9 concerning for malignancy: CT liver and abdomen with cirrhosis, large ascites, large pleural effusion, portal vein  thrombosis and intrahepatic cavernous transformation.  -GI signed off.  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  Progressively worse.   Diarrhea-C. difficile and GI panel negative.  Could be from fluid overload and possible malignancy -Imodium as needed   Paroxysmal A. fib: CHA2DS2-VASc score 5.  Not on [facility] prior to admission   Hypotension/history of essential hypertension- -Hold antihypertensive meds.  Thrombocytopenia: Slowly dropping.   Goal of care counseling-for comfort care as above.   Severe malnutrition: As evidenced by significant muscle mass and subcu fat loss Body mass index is 19.8 kg/m. Nutrition Problem: Severe Malnutrition Etiology: chronic illness (COPD) Signs/Symptoms: severe muscle depletion, severe fat depletion, percent weight loss Percent weight loss: 14 % Interventions: Boost Breeze, MVI, Liberalize Diet   DVT prophylaxis:    On IV heparin  Code Status: DNR/DNI Family Communication: Updated patient's brother and sister-in-law at bedside. Level of care: Palliative Care Status is: Inpatient  Remains inpatient appropriate because: Safe disposition/residential hospice at [facility]       Consultants:  Gastroenterology Cardiology Palliative medicine   Sch Meds:  Scheduled Meds:  fluticasone furoate-vilanterol  1 puff Inhalation Daily    HYDROmorphone (DILAUDID) injection  0.5 mg Intravenous Q6H   sodium chloride flush  3 mL Intravenous Q12H   traZODone  50 mg Oral QHS   Continuous Infusions:  sodium chloride     PRN Meds:.sodium chloride, acetaminophen **OR** acetaminophen, antiseptic oral rinse, glycopyrrolate **OR** glycopyrrolate **OR** glycopyrrolate, haloperidol **OR**  haloperidol **OR** haloperidol lactate, HYDROmorphone (DILAUDID) injection, ipratropium-albuterol, loperamide, LORazepam **OR** LORazepam **OR** LORazepam, ondansetron **OR** ondansetron (ZOFRAN) IV, polyvinyl alcohol, sodium chloride flush  Antimicrobials: Anti-infectives (From admission, onward)    None        I have personally reviewed the following labs and images: CBC: Recent Labs  Lab 04/18/21 1558 04/19/21 0620 04/20/21 0428 04/21/21 0334 04/22/21 0229  WBC 9.2 7.2 6.6 6.4 9.2  NEUTROABS 8.0*  --   --   --   --   HGB 13.4 10.9* 12.0* 11.0* 11.5*  HCT 41.5 34.1* 36.9* 33.6* 34.4*  MCV 97.4 98.3 96.6 94.9 93.2  PLT 171 130* 126* PLATELET CLUMPS NOTED ON SMEAR, UNABLE TO ESTIMATE 106*   BMP &GFR Recent Labs  Lab 04/18/21 1558 04/19/21 0620 04/20/21 0428 04/21/21 0334 04/22/21 0229  NA 136 136 135 136 132*  K 4.5 4.5 4.6 4.4 4.3  CL 104 106 105 105 104  CO2 20* 19* 20* 23 20*  GLUCOSE 83 85 107* 109* 113*  BUN 44* 43* 44* 49* 52*  CREATININE 1.86* 1.74* 1.83* 1.94* 2.13*  CALCIUM 9.2 8.2* 8.4* 8.4* 8.4*  MG 2.1  --   --  2.0 1.9  PHOS  --   --   --  3.7 2.8   Estimated Creatinine Clearance: 29 mL/min (A) (by C-G formula based on SCr of 2.13 mg/dL (H)). Liver & Pancreas: Recent Labs  Lab 04/18/21 1558 04/19/21 0620 04/20/21 0428 04/21/21  1027 04/22/21 0229  AST 50* 45* 44*  --  57*  ALT 28 23 24   --  26  ALKPHOS 171* 135* 142*  --  148*  BILITOT 1.1 1.1 0.9  --  0.8  PROT 7.2 5.9* 6.1*  --  5.9*  ALBUMIN 2.8* 2.2* 2.1* 2.4* 2.4*   No results for input(s): LIPASE, AMYLASE in the last 168 hours. Recent Labs  Lab 04/18/21 1558  AMMONIA 26   Diabetic: No results for input(s): HGBA1C in the last 72 hours. No results for input(s): GLUCAP in the last 168 hours. Cardiac Enzymes: No results for input(s): CKTOTAL, CKMB, CKMBINDEX, TROPONINI in the last 168 hours. No results for input(s): PROBNP in the last 8760 hours. Coagulation Profile: Recent Labs   Lab 04/18/21 1558  INR 1.1   Thyroid Function Tests: No results for input(s): TSH, T4TOTAL, FREET4, T3FREE, THYROIDAB in the last 72 hours. Lipid Profile: No results for input(s): CHOL, HDL, LDLCALC, TRIG, CHOLHDL, LDLDIRECT in the last 72 hours. Anemia Panel: No results for input(s): VITAMINB12, FOLATE, FERRITIN, TIBC, IRON, RETICCTPCT in the last 72 hours. Urine analysis:    Component Value Date/Time   COLORURINE YELLOW 04/19/2021 1039   APPEARANCEUR CLEAR 04/19/2021 1039   LABSPEC 1.008 04/19/2021 1039   PHURINE 5.0 04/19/2021 1039   GLUCOSEU NEGATIVE 04/19/2021 1039   HGBUR NEGATIVE 04/19/2021 1039   BILIRUBINUR NEGATIVE 04/19/2021 1039   KETONESUR NEGATIVE 04/19/2021 1039   PROTEINUR NEGATIVE 04/19/2021 1039   UROBILINOGEN 0.2 07/22/2014 1330   NITRITE NEGATIVE 04/19/2021 1039   LEUKOCYTESUR NEGATIVE 04/19/2021 1039   Sepsis Labs: Invalid input(s): PROCALCITONIN, LACTICIDVEN  Microbiology: Recent Results (from the past 240 hour(s))  Resp Panel by RT-PCR (Flu A&B, Covid) Nasopharyngeal Swab     Status: None   Collection Time: 04/18/21  7:28 PM   Specimen: Nasopharyngeal Swab; Nasopharyngeal(NP) swabs in vial transport medium  Result Value Ref Range Status   SARS Coronavirus 2 by RT PCR NEGATIVE NEGATIVE Final    Comment: (NOTE) SARS-CoV-2 target nucleic acids are NOT DETECTED.  The SARS-CoV-2 RNA is generally detectable in upper respiratory specimens during the acute phase of infection. The lowest concentration of SARS-CoV-2 viral copies this assay can detect is 138 copies/mL. A negative result does not preclude SARS-Cov-2 infection and should not be used as the sole basis for treatment or other patient management decisions. A negative result may occur with  improper specimen collection/handling, submission of specimen other than nasopharyngeal swab, presence of viral mutation(s) within the areas targeted by this assay, and inadequate number of viral copies(<138  copies/mL). A negative result must be combined with clinical observations, patient history, and epidemiological information. The expected result is Negative.  Fact Sheet for Patients:  04/20/21  Fact Sheet for Healthcare Providers:  BloggerCourse.com  This test is no t yet approved or cleared by the [employer] FDA and  has been authorized for detection and/or diagnosis of SARS-CoV-2 by FDA under an Emergency Use Authorization (EUA). This EUA will remain  in effect (meaning this test can be used) for the duration of the COVID-19 declaration under Section 564(b)(1) of the Act, 21 U.S.C.section 360bbb-3(b)(1), unless the authorization is terminated  or revoked sooner.       Influenza A by PCR NEGATIVE NEGATIVE Final   Influenza B by PCR NEGATIVE NEGATIVE Final    Comment: (NOTE) The Xpert Xpress SARS-CoV-2/FLU/RSV plus assay is intended as an aid in the diagnosis of influenza from Nasopharyngeal swab specimens and should not be used  as a sole basis for treatment. Nasal washings and aspirates are unacceptable for Xpert Xpress SARS-CoV-2/FLU/RSV testing.  Fact Sheet for Patients: BloggerCourse.com  Fact Sheet for Healthcare Providers: [employer]it  This test is not yet approved or cleared by the [place] FDA and has been authorized for detection and/or diagnosis of SARS-CoV-2 by FDA under an Emergency Use Authorization (EUA). This EUA will remain in effect (meaning this test can be used) for the duration of the COVID-19 declaration under Section 564(b)(1) of the Act, 21 U.S.C. section 360bbb-3(b)(1), unless the authorization is terminated or revoked.  Performed at [facility], [address] [place], [place] [address]   Gastrointestinal Panel by PCR , Stool     Status: None   Collection Time: 04/19/21  7:36 AM   Specimen: Stool  Result Value Ref  Range Status   Campylobacter species NOT DETECTED NOT DETECTED Final   Plesimonas shigelloides NOT DETECTED NOT DETECTED Final   Salmonella species NOT DETECTED NOT DETECTED Final   Yersinia enterocolitica NOT DETECTED NOT DETECTED Final   Vibrio species NOT DETECTED NOT DETECTED Final   Vibrio cholerae NOT DETECTED NOT DETECTED Final   Enteroaggregative E coli (EAEC) NOT DETECTED NOT DETECTED Final   Enteropathogenic E coli (EPEC) NOT DETECTED NOT DETECTED Final   Enterotoxigenic E coli (ETEC) NOT DETECTED NOT DETECTED Final   Shiga like toxin producing E coli (STEC) NOT DETECTED NOT DETECTED Final   Shigella/Enteroinvasive E coli (EIEC) NOT DETECTED NOT DETECTED Final   Cryptosporidium NOT DETECTED NOT DETECTED Final   Cyclospora cayetanensis NOT DETECTED NOT DETECTED Final   Entamoeba histolytica NOT DETECTED NOT DETECTED Final   Giardia lamblia NOT DETECTED NOT DETECTED Final   Adenovirus F40/41 NOT DETECTED NOT DETECTED Final   Astrovirus NOT DETECTED NOT DETECTED Final   Norovirus GI/GII NOT DETECTED NOT DETECTED Final   Rotavirus A NOT DETECTED NOT DETECTED Final   Sapovirus (I, II, IV, and V) NOT DETECTED NOT DETECTED Final    Comment: Performed at [facility], [address] [place], [place] [address]  C Difficile Quick Screen w PCR reflex     Status: None   Collection Time: 04/19/21  7:36 AM   Specimen: Stool  Result Value Ref Range Status   C Diff antigen NEGATIVE NEGATIVE Final   C Diff toxin NEGATIVE NEGATIVE Final   C Diff interpretation No C. difficile detected.  Final    Comment: Performed at [facility], [address] [place], [place] [address]  Culture, body fluid w Gram Stain-bottle     Status: None   Collection Time: 04/20/21 10:46 AM   Specimen: Fluid  Result Value Ref Range Status   Specimen Description FLUID  Final   Special Requests  RIGHT LUNG  Final   Culture   Final    NO GROWTH 5 DAYS Performed at [facility], [address] [place], [place] [address]    Report Status 04/25/2021 FINAL  Final  Gram stain     Status: None   Collection Time: 04/20/21 10:46 AM   Specimen: Fluid  Result Value Ref Range Status   Specimen Description FLUID  Final   Special Requests  RIGHT LUNG  Final   Gram Stain   Final    WBC PRESENT,BOTH PMN AND MONONUCLEAR NO ORGANISMS SEEN CYTOSPIN SMEAR Performed at [facility], [address] [place], [place] [address]    Report Status 04/20/2021 FINAL  Final  Radiology Studies: No results found.   Ori Trejos T. Edris Friedt [facility]  If 7PM-7AM, please contact night-coverage [email or web address] 04/25/2021, 12:25 PM

## 2021-04-25 NOTE — Progress Notes (Signed)
[facility] 3E01 [facility]) Hospital Liaison Note   Unfortunately, [facility] is not able to offer a room today. Family and [facility] Manager aware hospital liaison will follow up tomorrow or sooner if a room becomes available.    Please do not hesitate to call with any hospice related questions.    Thank you for the opportunity to participate in this patient's care.   Bobbie "Einar Gip, RN, BSN [facility] Liaison [phone]

## 2021-04-26 DIAGNOSIS — K767 Hepatorenal syndrome: Secondary | ICD-10-CM | POA: Diagnosis not present

## 2021-04-26 DIAGNOSIS — I10 Essential (primary) hypertension: Secondary | ICD-10-CM | POA: Diagnosis not present

## 2021-04-26 DIAGNOSIS — R531 Weakness: Secondary | ICD-10-CM | POA: Diagnosis not present

## 2021-04-26 DIAGNOSIS — Z515 Encounter for palliative care: Secondary | ICD-10-CM | POA: Diagnosis not present

## 2021-04-26 DIAGNOSIS — I959 Hypotension, unspecified: Secondary | ICD-10-CM | POA: Diagnosis not present

## 2021-04-26 DIAGNOSIS — E43 Unspecified severe protein-calorie malnutrition: Secondary | ICD-10-CM | POA: Diagnosis not present

## 2021-04-26 DIAGNOSIS — Z743 Need for continuous supervision: Secondary | ICD-10-CM | POA: Diagnosis not present

## 2021-04-26 DIAGNOSIS — R9431 Abnormal electrocardiogram [ECG] [EKG]: Secondary | ICD-10-CM | POA: Diagnosis not present

## 2021-04-26 DIAGNOSIS — K7031 Alcoholic cirrhosis of liver with ascites: Secondary | ICD-10-CM | POA: Diagnosis not present

## 2021-04-26 DIAGNOSIS — N179 Acute kidney failure, unspecified: Secondary | ICD-10-CM | POA: Diagnosis not present

## 2021-04-26 DIAGNOSIS — J9621 Acute and chronic respiratory failure with hypoxia: Secondary | ICD-10-CM | POA: Diagnosis not present

## 2021-04-26 DIAGNOSIS — N183 Chronic kidney disease, stage 3 unspecified: Secondary | ICD-10-CM | POA: Diagnosis not present

## 2021-04-26 DIAGNOSIS — R69 Illness, unspecified: Secondary | ICD-10-CM | POA: Diagnosis not present

## 2021-04-26 DIAGNOSIS — I480 Paroxysmal atrial fibrillation: Secondary | ICD-10-CM | POA: Diagnosis not present

## 2021-04-26 DIAGNOSIS — J90 Pleural effusion, not elsewhere classified: Secondary | ICD-10-CM | POA: Diagnosis not present

## 2021-04-26 MED ORDER — GLYCOPYRROLATE 1 MG PO TABS: 1.0000 mg | ORAL_TABLET | ORAL | Status: AC | PRN

## 2021-04-26 MED ORDER — ONDANSETRON 4 MG PO TBDP: 4.0000 mg | ORAL_TABLET | Freq: Four times a day (QID) | ORAL | 0 refills | Status: AC | PRN

## 2021-04-26 MED ORDER — LORAZEPAM 2 MG/ML PO CONC: 1.0000 mg | ORAL | 0 refills | Status: AC | PRN

## 2021-04-26 MED ORDER — MORPHINE SULFATE (CONCENTRATE) 10 MG /0.5 ML PO SOLN: 10.0000 mg | ORAL | 0 refills | Status: AC | PRN

## 2021-04-26 NOTE — Discharge Summary (Signed)
Physician Discharge Summary  [name] [name]. ZA:[id number] DOB: 07/29/1950 DOA: 04/18/2021  PCP: Ladell Pier, MD  Admit date: 04/18/2021 Discharge date: 04/26/2021 Admitted From: Home Disposition: Residential hospice at [facility]  Discharge Condition: Poor prognosis-Hours to days CODE STATUS: DNR/DNI  Hospital Course: 70 year old M with PMH of combined CHF, PAF, COPD, CVA, CKD-CAD and HTN presenting with abdominal swelling, diarrhea, 30 pound weight loss and poor p.o. intake, and admitted with acute on chronic RF with hypoxia in the setting of large right pleural effusion, liver cirrhosis with ascites and AKI.  He underwent thoracocentesis with removal of 1.5 L transudative and culture negative fluid.  He also underwent paracentesis with removal of 9 L.  He has markedly elevated AFP and CA 19-9.  C. difficile negative.  CT of his liver on abdomen with advanced cirrhotic changes, portal vein thrombosis, intrahepatic cavernous transformation, large volume ascites, large right pleural effusion but no evidence of hepatic lesion to suggest hepatoma.    TTE with LVEF of 35 to 40%, G2-DD, GH and severe aortic stenosis.  Cardiology consulted and did not have much to offer given his other comorbidities and poor prognosis.   Patient has progressive decline goal of care discussion initiated.  Eventually, transitioned to full comfort care on 04/22/2021.  Transferred to [facility] on 04/26/2021 for end-of-life care.   See individual problem list below for more on hospital course.  Discharge Diagnoses:  End-of-life care/full comfort care/DNR/DNI -Plan for transfer to [facility] for end-of-life care today.   Acute on chronic RF with hypoxia-multifactorial including pleural effusion with lung collapse, ascites, combined CHF and aortic stenosis.  -Comfortable on 3 L.   Recurrent large right-sided pleural effusion with right lower lobe collapse-likely from CHF, liver cirrhosis and  malnutrition -S/p US guided thoracocentesis of right pleural effusion with 1.5 L transudative and culture negative fluid   Acute on chronic combined CHF: TTE with LVEF of 35 to 40%, G2-DD, GH and severe aortic stenosis.  He presents with significant effusion.  Hypotension precludes diuretics.  Cardiology signed off   Severe aortic stenosis: Not a candidate for surgical repair.  Cardiology signed off   Liver cirrhosis with ascites/markedly elevated AFP and CA 19-9 concerning for malignancy: CT liver and abdomen with cirrhosis, large ascites, large pleural effusion, portal vein thrombosis and intrahepatic cavernous transformation.  -GI signed off.   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  Progressively worse.   Diarrhea-C. difficile and GI panel negative.  Could be from fluid overload and possible malignancy -Imodium as needed   Paroxysmal A. fib: CHA2DS2-VASc score 5.  Not on [facility] prior to admission   Hypotension/history of essential hypertension- -Hold antihypertensive meds.   Thrombocytopenia: Slowly dropping.   Goal of care counseling-for comfort care as above.   Severe malnutrition: As evidenced by significant muscle mass and subcu fat loss Body mass index is 19.8 kg/m. Nutrition Problem: Severe Malnutrition Etiology: chronic illness (COPD) Signs/Symptoms: severe muscle depletion, severe fat depletion, percent weight loss Percent weight loss: 14 % Interventions: Boost Breeze, MVI, Liberalize Diet     Discharge Exam: Vitals:   04/25/21 0755 04/25/21 0853 04/25/21 1916 04/26/21 0824  BP: (!) 85/55  (!) 93/53   Pulse: 91  85   Temp: 97.9 F (36.6 C)  Resp: 17  16   Height:      Weight:      SpO2: 95% 95% 93%  95%  TempSrc: Oral     BMI (Calculated):        GENERAL: Frail and chronically ill-appearing.  Looks very weak and lethargic. HEENT: MMM.  Vision and hearing grossly intact.  Significant temporal wasting. RESP: 95% on 3 L.  No IWOB.   ABD/GI/GU: Notable distended abdomen from ascites. MSK/EXT:  Moves extremities.  Significant muscle mass and subcu fat loss. SKIN: no apparent skin lesion or wound NEURO: Awake and alert. Oriented fairly.  No apparent focal neuro deficit. PSYCH: Looks very weak and lethargic.  No distress or agitation.  Discharge Instructions   Allergies as of 04/26/2021       Reactions   Levaquin [levofloxacin In D5w]    Generalized redness; received azithromycin, rocephin, and levaquin on the same day as reaction developed.        Medication List     STOP taking these medications    atorvastatin 40 MG tablet Commonly known as: LIPITOR   bisoprolol 5 MG tablet Commonly known as: ZEBETA   fluticasone-salmeterol 100-50 MCG/ACT Aepb Commonly known as: Advair Diskus   furosemide 40 MG tablet Commonly known as: LASIX   lisinopril 5 MG tablet Commonly known as: ZESTRIL   polyvinyl alcohol 1.4 % ophthalmic solution Commonly known as: LIQUIFILM TEARS       TAKE these medications    glycopyrrolate 1 MG tablet Commonly known as: ROBINUL Take 1 tablet (1 mg total) by mouth every 4 (four) hours as needed (excessive secretions).   LORazepam 2 MG/ML concentrated solution Commonly known as: ATIVAN Place 0.5 mLs (1 mg total) under the tongue every 4 (four) hours as needed for anxiety.   morphine CONCENTRATE 10 mg / 0.5 ml concentrated solution Take 0.5 mLs (10 mg total) by mouth every 3 (three) hours as needed for moderate pain, severe pain or shortness of breath.   ondansetron 4 MG disintegrating tablet Commonly known as: ZOFRAN-ODT Take 1 tablet (4 mg total) by mouth every 6 (six) hours as needed for nausea.         Consultations: Cardiology Gastroenterology Palliative medicine  Procedures/Studies: 10/24-US guided paracentesis with removal of 9 L 10/25-US guided thoracocentesis of right pleural effusion with 1.5 L transudative fluid   DG Chest 1 View  Result Date: 04/20/2021 CLINICAL DATA:  Pleural effusion. Additional history provided: Status post right thoracentesis. EXAM: CHEST  1 VIEW COMPARISON:  Radiographs 04/20/2021 and earlier. CT chest 04/18/2021. Prior chest FINDINGS: Mild cardiomegaly, unchanged. There has been interval right thoracentesis. There is a small residual right pleural effusion. Associated right basilar atelectasis. The left lung is clear. No evidence of pneumothorax. No acute bony abnormality identified. IMPRESSION: Interval right thoracentesis with small residual right pleural effusion. Associated right basilar atelectasis. Pneumonia at the right lung base cannot be excluded and clinical correlation is recommended. No evidence of pneumothorax. Mild cardiomegaly, unchanged. Electronically Signed   By: Kellie Simmering D.O.   On: 04/20/2021 11:39   DG CHEST PORT 1 VIEW  Result Date: 04/20/2021 CLINICAL DATA:  Respiratory distress. EXAM: PORTABLE CHEST 1 VIEW COMPARISON:  Chest radiograph and CT dated 04/18/2021. FINDINGS: Complete opacification of the right hemithorax, progressed since the prior studies. There is deviation of the trachea to the right the midline. The left lung is clear. No pneumothorax. No acute osseous pathology. IMPRESSION: Complete opacification of the right hemithorax, progressed since the prior studies.  Electronically Signed   By: Anner Crete M.D.   On: 04/20/2021 02:36   DG Chest Port 1 View  Result Date: 04/18/2021 CLINICAL DATA:  Short of breath EXAM: PORTABLE CHEST 1 VIEW COMPARISON:  Chest x-ray 07/30/2014 FINDINGS: Persistently enlarged cardiac silhouette. The heart and mediastinal contours are unchanged. Aortic calcification. Right mid lower lung  zone airspace opacity. No pulmonary edema. Interval increase in size of an at least small right pleural effusion. No pneumothorax. No acute osseous abnormality. IMPRESSION: 1. Interval increase in size of an at least small right pleural effusion. 2. Right mid lower lung zone airspace opacity that could represent atelectasis versus infection/inflammation. Followup PA and lateral chest X-ray is recommended in 3-4 weeks following therapy to ensure resolution and exclude underlying malignancy. Electronically Signed   By: Iven Finn M.D.   On: 04/18/2021 16:08   ECHOCARDIOGRAM COMPLETE  Result Date: 04/20/2021    ECHOCARDIOGRAM REPORT   Patient Name:   [name] [name]. Date of Exam: 04/20/2021 Medical Rec #:  [id number]        Height:       71.0 in Accession #:    [id number]       Weight:       157.0 lb Date of Birth:  [birth year]        BSA:          1.902 m Patient Age:    15 years         BP:           100/58 mmHg Patient Gender: M                HR:           70 bpm. Exam Location:  Inpatient Procedure: 2D Echo, Color Doppler and Cardiac Doppler Indications:    Dyspnea  History:        Patient has no prior history of Echocardiogram examinations.                 CHF, Arrythmias:Atrial Fibrillation, Signs/Symptoms:Shortness of                 Breath; Risk Factors:Hypertension and Dyslipidemia.  Sonographer:    Jyl Heinz Referring Phys: [id number] RIPUDEEP K RAI IMPRESSIONS  1. Suspect severe low flow low gradient aortic stenosis is present. Visually, the valve is severely calcified with restricted leaflet motion in systole. Vmax 2.5 m/s, MG 17 mmHG, AVA 0.68, DI 0.20. The CW jet is rounded with prolonged AT >100 msec. Would recommend dobutamine stress echo vs calcium score for clarification. The aortic valve is tricuspid. There is severe calcifcation of the aortic valve. There is severe thickening of the aortic valve. Aortic valve regurgitation is trivial. Severe aortic valve stenosis.  2. Left ventricular  ejection fraction, by estimation, is 35 to 40%. Left ventricular ejection fraction by 2D MOD biplane is 37.7 %. The left ventricle has moderately decreased function. The left ventricle demonstrates global hypokinesis. Left ventricular diastolic parameters are consistent with Grade II diastolic dysfunction (pseudonormalization).  3. Right ventricular systolic function is mildly reduced. The right ventricular size is mildly enlarged. Tricuspid regurgitation signal is inadequate for assessing PA pressure.  4. The mitral valve is degenerative. Mild mitral valve regurgitation. No evidence of mitral stenosis.  5. The inferior vena cava is normal in size with <50% respiratory variability, suggesting right atrial pressure of 8 mmHg. FINDINGS  Left Ventricle: Left ventricular ejection fraction, by estimation, is 35 to 40%. Left ventricular ejection  fraction by 2D MOD biplane is 37.7 %. The left ventricle has moderately decreased function. The left ventricle demonstrates global hypokinesis. The left ventricular internal cavity size was normal in size. There is no left ventricular hypertrophy. Left ventricular diastolic parameters are consistent with Grade II diastolic dysfunction (pseudonormalization). Right Ventricle: The right ventricular size is mildly enlarged. No increase in right ventricular wall thickness. Right ventricular systolic function is mildly reduced. Tricuspid regurgitation signal is inadequate for assessing PA pressure. Left Atrium: Left atrial size was normal in size. Right Atrium: Right atrial size was normal in size. Pericardium: There is no evidence of pericardial effusion. Mitral Valve: The mitral valve is degenerative in appearance. There is mild calcification of the anterior mitral valve leaflet(s). Mild mitral valve regurgitation. No evidence of mitral valve stenosis. Tricuspid Valve: The tricuspid valve is grossly normal. Tricuspid valve regurgitation is trivial. Aortic Valve: Suspect severe low  flow low gradient aortic stenosis is present. Visually, the valve is severely calcified with restricted leaflet motion in systole. Vmax 2.5 m/s, MG 17 mmHG, AVA 0.68, DI 0.20. The CW jet is rounded with prolonged AT >100 msec. Would recommend dobutamine stress echo vs calcium score for clarification. The aortic valve is tricuspid. There is severe calcifcation of the aortic valve. There is severe thickening of the aortic valve. Aortic valve regurgitation is trivial. Severe aortic stenosis is present. Aortic valve mean gradient measures 17.0 mmHg. Aortic valve peak gradient measures 24.6 mmHg. Aortic valve area, by VTI measures 0.68 cm. Pulmonic Valve: The pulmonic valve was grossly normal. Pulmonic valve regurgitation is trivial. No evidence of pulmonic stenosis. Aorta: The aortic root and ascending aorta are structurally normal, with no evidence of dilitation. Venous: The inferior vena cava is normal in size with less than 50% respiratory variability, suggesting right atrial pressure of 8 mmHg. IAS/Shunts: The atrial septum is grossly normal. Additional Comments: There is a small pleural effusion in the left lateral region. Mild ascites is present.  LEFT VENTRICLE PLAX 2D                        Biplane EF (MOD) LVIDd:         5.60 cm         LV Biplane EF:   Left LVIDs:         4.80 cm                          ventricular LV PW:         1.00 cm                          ejection LV IVS:        0.90 cm                          fraction by LVOT diam:     2.10 cm                          2D MOD LV SV:         41                               biplane is LV SV Index:   21  37.7 %. LVOT Area:     3.46 cm                                Diastology                                LV e' medial:    3.24 cm/s LV Volumes (MOD)               LV E/e' medial:  16.0 LV vol d, MOD    109.0 ml      LV e' lateral:   6.31 cm/s A2C:                           LV E/e' lateral: 8.2 LV vol d, MOD    156.0 ml A4C:  LV vol s, MOD    64.6 ml A2C: LV vol s, MOD    96.4 ml A4C: LV SV MOD A2C:   44.4 ml LV SV MOD A4C:   156.0 ml LV SV MOD BP:    52.0 ml RIGHT VENTRICLE            IVC RV Basal diam:  4.10 cm    IVC diam: 1.60 cm RV S prime:     7.27 cm/s TAPSE (M-mode): 2.3 cm LEFT ATRIUM             Index        RIGHT ATRIUM           Index LA diam:        5.20 cm 2.73 cm/m   RA Area:     16.20 cm LA Vol (A2C):   57.1 ml 30.01 ml/m  RA Volume:   40.80 ml  21.45 ml/m LA Vol (A4C):   57.3 ml 30.12 ml/m LA Biplane Vol: 58.4 ml 30.70 ml/m  AORTIC VALVE AV Area (Vmax):    0.72 cm AV Area (Vmean):   0.70 cm AV Area (VTI):     0.68 cm AV Vmax:           248.00 cm/s AV Vmean:          188.000 cm/s AV VTI:            0.599 m AV Peak Grad:      24.6 mmHg AV Mean Grad:      17.0 mmHg LVOT Vmax:         51.70 cm/s LVOT Vmean:        38.200 cm/s LVOT VTI:          0.118 m LVOT/AV VTI ratio: 0.20  AORTA Ao Root diam: 3.40 cm Ao Asc diam:  3.80 cm MITRAL VALVE MV Area (PHT): 4.33 cm    SHUNTS MV Decel Time: 175 msec    Systemic VTI:  0.12 m MR Peak grad: 57.5 mmHg    Systemic Diam: 2.10 cm MR Vmax:      379.00 cm/s MV E velocity: 51.80 cm/s MV A velocity: 48.40 cm/s MV E/A ratio:  1.07 Eleonore Chiquito MD Electronically signed by Eleonore Chiquito MD Signature Date/Time: 04/20/2021/5:58:50 PM    Final    CT LIVER ABDOMEN W WO CONTRAST  Result Date: 04/21/2021 CLINICAL DATA:  Evaluate liver lesion seen on noncontrast study 04/18/2021 EXAM: CT ABDOMEN WITH CONTRAST TECHNIQUE: Multidetector CT imaging of the abdomen was  performed using the standard protocol following bolus administration of intravenous contrast. CONTRAST:  137mL OMNIPAQUE IOHEXOL 350 MG/ML SOLN COMPARISON:  CT scan 04/18/2021 FINDINGS: Lower chest: Large right pleural effusion and small left pleural effusion. The left lung bases grossly clear. The heart is within normal limits in size. No pericardial effusion. Moderate aortic calcifications. Hepatobiliary: Severe cirrhotic  changes involving the liver with areas of confluent hepatic fibrosis and associated cortical scarring changes and retraction. I [name] not see an obvious early arterial phase enhancing hepatic lesion to suggest a hepatoma. There is thrombosis of the middle portal vein and also partial thrombosis of the right portal vein centrally. Extensive network of arterial vessels around the occluded middle portal vein and slightly more distally there appears to be collateralization or intrahepatic cavernous transformation. There are also some more distal branches of the middle portal vein that are thrombosed. Portal venous phase images show patent hepatic veins. The gallbladder is grossly normal.  No common bile duct dilatation. Pancreas: No mass, inflammation or ductal dilatation. Spleen: Normal size.  No focal lesions. Adrenals/Urinary Tract: The adrenal glands and kidneys are grossly normal. Stomach/Bowel: The stomach is distended with fluid, gas and fluid. The duodenum is grossly. The visualized small bowel and colon grossly no. Vascular/Lymphatic: Advanced atherosclerotic calcification involving the aorta and branch vessels but no aneurysm or dissection. The main portal vein and splenic vein are patent. No mesenteric or retroperitoneal mass adenopathy. Scattered upper abdominal lymph nodes typical with cirrhosis. Other: Large volume abdominal ascites with a markedly distended abdomen. Musculoskeletal: No significant bony findings. IMPRESSION: 1. Advanced cirrhotic changes involving the liver with areas of confluent hepatic fibrosis and associated cortical scarring changes and retraction. No obvious early arterial phase enhancing hepatic lesion to suggest a hepatoma. 2. Thrombosis of the middle portal vein and partial thrombosis of the right portal vein with associated perfusion abnormality and predominant arterial blood flow. Intrahepatic cavernous transformation noted. 3. Large volume abdominal ascites with a markedly  distended abdomen. 4. Large right pleural effusion and small left pleural effusion. 5. Advanced atherosclerotic calcification involving the aorta and branch vessels. Aortic Atherosclerosis (ICD10-I70.0). Electronically Signed   By: Marijo Sanes M.D.   On: 04/21/2021 17:30   VAS [place] LOWER EXTREMITY VENOUS (DVT) (7a-7p)  Result Date: 04/18/2021  Lower Venous DVT Study Patient Name:  [name] [name].  Date of Exam:   04/18/2021 Medical Rec #: [id number]         Accession #:    [id number] Date of Birth: 05-27-1951         Patient Gender: M Patient Age:   84 years Exam Location:  [facility] Procedure:      VAS [place] LOWER EXTREMITY VENOUS (DVT) Referring Phys: JULIE HAVILAND --------------------------------------------------------------------------------  Indications: Swelling.  Risk Factors: None identified. Limitations: Poor ultrasound/tissue interface. Comparison Study: No prior studies. Performing Technologist: Oliver Hum RVT  Examination Guidelines: A complete evaluation includes B-mode imaging, spectral Doppler, color Doppler, and power Doppler as needed of all accessible portions of each vessel. Bilateral testing is considered an integral part of a complete examination. Limited examinations for reoccurring indications may be performed as noted. The reflux portion of the exam is performed with the patient in reverse Trendelenburg.  +---------+---------------+---------+-----------+----------+--------------+ RIGHT    CompressibilityPhasicitySpontaneityPropertiesThrombus Aging +---------+---------------+---------+-----------+----------+--------------+ CFV      Full           Yes      Yes                                 +---------+---------------+---------+-----------+----------+--------------+  SFJ      Full                                                        +---------+---------------+---------+-----------+----------+--------------+ FV Prox  Full                                                         +---------+---------------+---------+-----------+----------+--------------+ FV Mid   Full                                                        +---------+---------------+---------+-----------+----------+--------------+ FV Distal               Yes      Yes                                 +---------+---------------+---------+-----------+----------+--------------+ PFV      Full                                                        +---------+---------------+---------+-----------+----------+--------------+ POP      Full           Yes      Yes                                 +---------+---------------+---------+-----------+----------+--------------+ PTV      Full                                                        +---------+---------------+---------+-----------+----------+--------------+ PERO     Full                                                        +---------+---------------+---------+-----------+----------+--------------+   +----+---------------+---------+-----------+----------+--------------+ LEFTCompressibilityPhasicitySpontaneityPropertiesThrombus Aging +----+---------------+---------+-----------+----------+--------------+ CFV Full           Yes      Yes                                 +----+---------------+---------+-----------+----------+--------------+     Summary: RIGHT: - There is no evidence of deep vein thrombosis in the lower extremity. However, portions of this examination were limited- see technologist comments above.  - No cystic structure found in the popliteal fossa.  LEFT: - No evidence of common femoral vein obstruction.  *See table(s) above for measurements  and observations. Electronically signed by Deitra Mayo MD on 04/18/2021 at 5:44:35 PM.    Final    CT CHEST ABDOMEN PELVIS WO CONTRAST  Result Date: 04/18/2021 CLINICAL DATA:  Pleural effusion.  Shortness of breath. EXAM: CT CHEST, ABDOMEN  AND PELVIS WITHOUT CONTRAST TECHNIQUE: Multidetector CT imaging of the chest, abdomen and pelvis was performed following the standard protocol without IV contrast. COMPARISON:  Chest x-ray 04/18/2021 FINDINGS: CT CHEST FINDINGS Cardiovascular: Normal heart size. No significant pericardial effusion. The thoracic aorta is normal in caliber. Severe atherosclerotic plaque of the thoracic aorta. Three-vessel coronary artery calcifications. Mediastinum/Nodes: No gross hilar adenopathy, noting limited sensitivity for the detection of hilar adenopathy on this noncontrast study. No enlarged mediastinal or axillary lymph nodes. Thyroid gland, trachea, and esophagus demonstrate no significant findings. Lungs/Pleura: Mild-to-moderate centrilobular emphysematous changes. Collapse of the right lower lobe. No focal consolidation. No pulmonary nodule. No pulmonary mass. No pneumothorax. Moderate small volume right pleural effusion. No left pleural effusion. Musculoskeletal: No chest wall abnormality. No suspicious lytic or blastic osseous lesions. No acute displaced fracture. Multilevel degenerative changes of the spine. CT ABDOMEN PELVIS FINDINGS Hepatobiliary: Nodular hepatic contour. Heterogeneous appearance of the liver with likely underlying mass lesions. No gallstones, gallbladder wall thickening, or pericholecystic fluid. No biliary dilatation. Pancreas: No focal lesion. Normal pancreatic contour. No surrounding inflammatory changes. No main pancreatic ductal dilatation. Spleen: Normal in size without focal abnormality. Adrenals/Urinary Tract: No adrenal nodule bilaterally. No nephrolithiasis and no hydronephrosis. No definite contour-deforming renal mass. No ureterolithiasis or hydroureter. The urinary bladder is unremarkable. Stomach/Bowel: Stomach is within normal limits. No evidence of definite bowel wall thickening or dilatation. No pneumatosis identified. Vascular/Lymphatic: No abdominal aorta or iliac aneurysm. Severe  atherosclerotic plaque of the aorta and its branches. No gross abdominal, pelvic, or inguinal lymphadenopathy. Reproductive: The prostate is unremarkable. Other: Large volume ascites. Musculoskeletal: Small volume right inguinal hernia containing ascites. No suspicious lytic or blastic osseous lesions. No acute displaced fracture. Multilevel degenerative changes of the spine. IMPRESSION: 1. Moderate to large volume right pleural effusion. Associated collapse of the right lower lobe. 2. Question cirrhotic morphology of the liver with heterogeneous appearance of the parenchyma suggestive of underlying mass lesions. Concern for malignancy. Recommend nonemergent MRI liver protocol. When the patient is clinically stable and able to follow directions and hold their breath (preferably as an outpatient) further evaluation with dedicated abdominal MRI should be considered. 3. Large volume ascites. 4. Markedly limited evaluation on this noncontrast study. 5. Aortic Atherosclerosis (ICD10-I70.0) and Emphysema (ICD10-J43.9). Electronically Signed   By: Iven Finn M.D.   On: 04/18/2021 19:45   IR Paracentesis  Result Date: 04/19/2021 INDICATION: Patient presented to the ED with a new onset abdominal swelling and diarrhea, found to have large volume ascites and large right pleural effusion. Request to IR for diagnostic and therapeutic paracentesis. EXAM: ULTRASOUND GUIDED DIAGNOSTIC AND THERAPEUTIC PARACENTESIS MEDICATIONS: 8 mL 1% lidocaine COMPLICATIONS: None immediate. PROCEDURE: Informed written consent was obtained from the patient after a discussion of the risks, benefits and alternatives to treatment. A timeout was performed prior to the initiation of the procedure. Initial ultrasound scanning demonstrates a large amount of ascites within the right lower abdominal quadrant. The right lower abdomen was prepped and draped in the usual sterile fashion. 1% lidocaine was used for local anesthesia. Following this, a 19  gauge, 7-cm, Yueh catheter was introduced. An ultrasound image was saved for documentation purposes. The paracentesis was performed. The catheter was removed and a dressing was applied.  The patient tolerated the procedure well without immediate post procedural complication. FINDINGS: A total of approximately 9.0 L of clear, very light yellow fluid was removed. Samples were sent to the laboratory as requested by the clinical team. IMPRESSION: Successful ultrasound-guided paracentesis yielding 9.0 liters of peritoneal fluid. Read by Candiss Norse, PA-C Electronically Signed   By: Miachel Roux M.D.   On: 04/19/2021 14:44   IR THORACENTESIS ASP PLEURAL SPACE W/IMG GUIDE  Result Date: 04/20/2021 INDICATION: Patient with a history of CHF admitted with increased shortness of breath and found to have a large right pleural effusion. Interventional radiology asked to perform a diagnostic and therapeutic thoracentesis. EXAM: ULTRASOUND GUIDED THORACENTESIS MEDICATIONS: 1% lidocaine 10 mL COMPLICATIONS: None immediate. PROCEDURE: An ultrasound guided thoracentesis was thoroughly discussed with the patient and questions answered. The benefits, risks, alternatives and complications were also discussed. The patient understands and wishes to proceed with the procedure. Written consent was obtained. Ultrasound was performed to localize and mark an adequate pocket of fluid in the right chest. The area was then prepped and draped in the normal sterile fashion. 1% Lidocaine was used for local anesthesia. Under ultrasound guidance a 6 Fr Safe-T-Centesis catheter was introduced. Thoracentesis was performed. The catheter was removed and a dressing applied. FINDINGS: A total of approximately 1.5 L of clear yellow fluid was removed. Samples were sent to the laboratory as requested by the clinical team. IMPRESSION: Successful ultrasound guided right thoracentesis yielding 1.5 L of pleural fluid. Read by: Soyla Dryer, NP  Electronically Signed   By: Jacqulynn Cadet M.D.   On: 04/20/2021 12:06       The results of significant diagnostics from this hospitalization (including imaging, microbiology, ancillary and laboratory) are listed below for reference.     Microbiology: Recent Results (from the past 240 hour(s))  Resp Panel by RT-PCR (Flu A&B, Covid) Nasopharyngeal Swab     Status: None   Collection Time: 04/18/21  7:28 PM   Specimen: Nasopharyngeal Swab; Nasopharyngeal(NP) swabs in vial transport medium  Result Value Ref Range Status   SARS Coronavirus 2 by RT PCR NEGATIVE NEGATIVE Final    Comment: (NOTE) SARS-CoV-2 target nucleic acids are NOT DETECTED.  The SARS-CoV-2 RNA is generally detectable in upper respiratory specimens during the acute phase of infection. The lowest concentration of SARS-CoV-2 viral copies this assay can detect is 138 copies/mL. A negative result does not preclude SARS-Cov-2 infection and should not be used as the sole basis for treatment or other patient management decisions. A negative result may occur with  improper specimen collection/handling, submission of specimen other than nasopharyngeal swab, presence of viral mutation(s) within the areas targeted by this assay, and inadequate number of viral copies(<138 copies/mL). A negative result must be combined with clinical observations, patient history, and epidemiological information. The expected result is Negative.  Fact Sheet for Patients:  EntrepreneurPulse.com.au  Fact Sheet for Healthcare Providers:  IncredibleEmployment.be  This test is no t yet approved or cleared by the [place] FDA and  has been authorized for detection and/or diagnosis of SARS-CoV-2 by FDA under an Emergency Use Authorization (EUA). This EUA will remain  in effect (meaning this test can be used) for the duration of the COVID-19 declaration under Section 564(b)(1) of the Act, 21 U.S.C.section  360bbb-3(b)(1), unless the authorization is terminated  or revoked sooner.       Influenza A by PCR NEGATIVE NEGATIVE Final   Influenza B by PCR NEGATIVE NEGATIVE Final    Comment: (NOTE) The  Xpert Xpress SARS-CoV-2/FLU/RSV plus assay is intended as an aid in the diagnosis of influenza from Nasopharyngeal swab specimens and should not be used as a sole basis for treatment. Nasal washings and aspirates are unacceptable for Xpert Xpress SARS-CoV-2/FLU/RSV testing.  Fact Sheet for Patients: EntrepreneurPulse.com.au  Fact Sheet for Healthcare Providers: IncredibleEmployment.be  This test is not yet approved or cleared by the [place] FDA and has been authorized for detection and/or diagnosis of SARS-CoV-2 by FDA under an Emergency Use Authorization (EUA). This EUA will remain in effect (meaning this test can be used) for the duration of the COVID-19 declaration under Section 564(b)(1) of the Act, 21 U.S.C. section 360bbb-3(b)(1), unless the authorization is terminated or revoked.  Performed at [facility], [place] [address] [place], [place] [address]   Gastrointestinal Panel by PCR , Stool     Status: None   Collection Time: 04/19/21  7:36 AM   Specimen: Stool  Result Value Ref Range Status   Campylobacter species NOT DETECTED NOT DETECTED Final   Plesimonas shigelloides NOT DETECTED NOT DETECTED Final   Salmonella species NOT DETECTED NOT DETECTED Final   Yersinia enterocolitica NOT DETECTED NOT DETECTED Final   Vibrio species NOT DETECTED NOT DETECTED Final   Vibrio cholerae NOT DETECTED NOT DETECTED Final   Enteroaggregative E coli (EAEC) NOT DETECTED NOT DETECTED Final   Enteropathogenic E coli (EPEC) NOT DETECTED NOT DETECTED Final   Enterotoxigenic E coli (ETEC) NOT DETECTED NOT DETECTED Final   Shiga like toxin producing E coli (STEC) NOT DETECTED NOT DETECTED Final   Shigella/Enteroinvasive E coli (EIEC) NOT DETECTED NOT  DETECTED Final   Cryptosporidium NOT DETECTED NOT DETECTED Final   Cyclospora cayetanensis NOT DETECTED NOT DETECTED Final   Entamoeba histolytica NOT DETECTED NOT DETECTED Final   Giardia lamblia NOT DETECTED NOT DETECTED Final   Adenovirus F40/41 NOT DETECTED NOT DETECTED Final   Astrovirus NOT DETECTED NOT DETECTED Final   Norovirus GI/GII NOT DETECTED NOT DETECTED Final   Rotavirus A NOT DETECTED NOT DETECTED Final   Sapovirus (I, II, IV, and V) NOT DETECTED NOT DETECTED Final    Comment: Performed at [facility], [address] [place], [place] [address]  C Difficile Quick Screen w PCR reflex     Status: None   Collection Time: 04/19/21  7:36 AM   Specimen: Stool  Result Value Ref Range Status   C Diff antigen NEGATIVE NEGATIVE Final   C Diff toxin NEGATIVE NEGATIVE Final   C Diff interpretation No C. difficile detected.  Final    Comment: Performed at [facility], [place] [address] [place], [place] [address]  Culture, body fluid w Gram Stain-bottle     Status: None   Collection Time: 04/20/21 10:46 AM   Specimen: Fluid  Result Value Ref Range Status   Specimen Description FLUID  Final   Special Requests  RIGHT LUNG  Final   Culture   Final    NO GROWTH 5 DAYS Performed at [place] [address] [place], [place] [address]    Report Status 04/25/2021 FINAL  Final  Gram stain     Status: None   Collection Time: 04/20/21 10:46 AM   Specimen: Fluid  Result Value Ref Range Status   Specimen Description FLUID  Final   Special Requests  RIGHT LUNG  Final   Gram Stain   Final    WBC PRESENT,BOTH PMN AND MONONUCLEAR NO ORGANISMS SEEN CYTOSPIN SMEAR Performed  at [facility], [place] [address], [place], [place] [address]    Report Status 04/20/2021 FINAL  Final     Labs:  CBC: Recent Labs  Lab 04/20/21 0428 04/21/21 0334 04/22/21 0229  WBC 6.6 6.4 9.2  HGB 12.0* 11.0* 11.5*  HCT 36.9* 33.6* 34.4*  MCV 96.6 94.9 93.2  PLT 126*  PLATELET CLUMPS NOTED ON SMEAR, UNABLE TO ESTIMATE 106*   BMP &GFR Recent Labs  Lab 04/20/21 0428 04/21/21 0334 04/22/21 0229  NA 135 136 132*  K 4.6 4.4 4.3  CL 105 105 104  CO2 20* 23 20*  GLUCOSE 107* 109* 113*  BUN 44* 49* 52*  CREATININE 1.83* 1.94* 2.13*  CALCIUM 8.4* 8.4* 8.4*  MG  --  2.0 1.9  PHOS  --  3.7 2.8   Estimated Creatinine Clearance: 29 mL/min (A) (by C-G formula based on SCr of 2.13 mg/dL (H)). Liver & Pancreas: Recent Labs  Lab 04/20/21 0428 04/21/21 0334 04/22/21 0229  AST 44*  --  57*  ALT 24  --  26  ALKPHOS 142*  --  148*  BILITOT 0.9  --  0.8  PROT 6.1*  --  5.9*  ALBUMIN 2.1* 2.4* 2.4*   No results for input(s): LIPASE, AMYLASE in the last 168 hours. No results for input(s): AMMONIA in the last 168 hours. Diabetic: No results for input(s): HGBA1C in the last 72 hours. No results for input(s): GLUCAP in the last 168 hours. Cardiac Enzymes: No results for input(s): CKTOTAL, CKMB, CKMBINDEX, TROPONINI in the last 168 hours. No results for input(s): PROBNP in the last 8760 hours. Coagulation Profile: No results for input(s): INR, PROTIME in the last 168 hours. Thyroid Function Tests: No results for input(s): TSH, T4TOTAL, FREET4, T3FREE, THYROIDAB in the last 72 hours. Lipid Profile: No results for input(s): CHOL, HDL, LDLCALC, TRIG, CHOLHDL, LDLDIRECT in the last 72 hours. Anemia Panel: No results for input(s): VITAMINB12, FOLATE, FERRITIN, TIBC, IRON, RETICCTPCT in the last 72 hours. Urine analysis:    Component Value Date/Time   COLORURINE YELLOW 04/19/2021 1039   APPEARANCEUR CLEAR 04/19/2021 1039   LABSPEC 1.008 04/19/2021 1039   PHURINE 5.0 04/19/2021 1039   GLUCOSEU NEGATIVE 04/19/2021 1039   HGBUR NEGATIVE 04/19/2021 [place] 04/19/2021 [place] 04/19/2021 [place] 04/19/2021 1039   UROBILINOGEN 0.2 07/22/2014 1330   NITRITE NEGATIVE 04/19/2021 1039   LEUKOCYTESUR  NEGATIVE 04/19/2021 1039   Sepsis Labs: Invalid input(s): PROCALCITONIN, LACTICIDVEN   Time coordinating discharge: 45 minutes  SIGNED:  Mercy Riding, MD  [facility] 04/26/2021, 10:55 AM

## 2021-04-26 NOTE — Progress Notes (Addendum)
[facility] 3E[facility]) Hospital Liaison Note  [facility] is able to offer a room today. We are waiting for consents to be completed. Once complete the hospital liaison will arrange for transport.  Please send signed and completed DNR with patient at time of discharge.  Bedside RN please call report to [phone].  Please call with any questions or concerns.  1220: [facility] has been called for transport.  Thank you, Haynes Bast, BSN, RN [facility] Liaison [phone]

## 2021-04-26 NOTE — Progress Notes (Signed)
Daily Progress Note   Patient Name: [name] [name].       Date: 04/26/2021 DOB: 1951/02/28  Age: 70 y.o. MRN#: [id number] Attending Physician: Mercy Riding, MD Primary Care Physician: Ladell Pier, MD Admit Date: 04/18/2021  Reason for Consultation/Follow-up: Establishing goals of care  Patient Profile/HPI:   70 y.o. male  with past medical history of CHF, A. fib, COPD, CVA, CKD, CAD, and HTN, admitted on 04/18/2021 with terminal swelling, weight loss, poor p.o. intake, diarrhea, acute on chronic respiratory failure with hypoxia.  Work-up revealed a large right pleural effusion, liver cirrhosis with ascites, hepatorenal and cardiorenal syndrome.  He had thoracentesis and paracentesis with very rapid reaccumulation of all fluids.  His recovery is complicated by ongoing hypotension.  He is not eating. Palliative medicine consulted for goals of care discussion.  Subjective: [name] [name] appears to be resting comfortably. No family at bedside.  He is a bit confused. When I ask about his pain he says he doesn't want anymore pain medicine because it isn't working, however, he then agrees that currently he does not have pain.  Noted he has a bed at [facility] today.  Review of Systems  Unable to perform ROS: Acuity of condition    Physical Exam Vitals and nursing note reviewed.  Constitutional:      Appearance: He is ill-appearing.     Comments: Frail, cachectic  Neurological:     Mental Status: He is disoriented.     Comments: Sleeping             Vital Signs: BP (!) 93/53 (BP Location: Left Arm)   Pulse 85   Temp 97.9 F (36.6 C) (Oral)   Resp 16   Ht 5\' 11"  (1.803 m)   Wt 64.4 kg   SpO2 95%   BMI 19.80 kg/m  SpO2: SpO2: 95 % O2 Device: O2 Device: Nasal Cannula O2 Flow Rate: O2 Flow  Rate (L/min): 3 L/min  Intake/output summary:  Intake/Output Summary (Last 24 hours) at 04/26/2021 1222 Last data filed at 04/25/2021 1848 Gross per 24 hour  Intake 240 ml  Output --  Net 240 ml    LBM: Last BM Date: 04/26/21 Baseline Weight: Weight: 71.2 kg Most recent weight: Weight: 64.4 kg       Palliative Assessment/Data: PPS: 20%  Patient Active Problem List   Diagnosis Date Noted   AKI (acute kidney injury) (HCC)    Generalized weakness    Protein-calorie malnutrition, severe 04/21/2021   Acute on chronic respiratory failure with hypoxia (HCC) 04/19/2021   Ascites 04/18/2021   Pleural effusion on right 04/18/2021   Hypoxemia 04/18/2021   CKD (chronic kidney disease) stage 3, GFR 30-59 ml/min (HCC) 04/18/2021   PAF (paroxysmal atrial fibrillation) (HCC) 04/18/2021   Abnormal CT of liver 04/18/2021   Diarrhea 04/18/2021   Essential hypertension 04/18/2021   Unintended weight loss 12/11/2020   Hypoxemic respiratory failure, chronic (HCC) 12/11/2020   Renal insufficiency 12/11/2020   End of life care 09/14/2015   Chronic systolic congestive heart failure (HCC) 09/14/2015   Acute systolic congestive heart failure (HCC) 08/12/2014   COPD bronchitis 08/12/2014   Alcohol abuse 08/12/2014   Cigarette nicotine dependence in remission 08/12/2014   Hyperlipidemia 08/12/2014   COLD (chronic obstructive lung disease) (HCC)    Thrombocytopenia (HCC)    QT prolongation    Korsakoff syndrome (HCC)    Chronic alcohol abuse    Altered consciousness    Shortness of breath    SOB (shortness of breath)    Atrial fibrillation (HCC) 07/24/2014   CHF exacerbation (HCC) 07/22/2014   Acute respiratory failure with hypoxia (HCC)    Alcohol withdrawal (HCC)    CAP (community acquired pneumonia)    Acute respiratory failure (HCC)    Dyspnea    CHF (congestive heart failure) (HCC) 07/21/2014    Palliative Care Assessment & Plan     Assessment/Recommendations/Plan  Continue current comfort measures D/C to [facility] today   Code Status: DNR  Prognosis:  < 2 weeks  Discharge Planning: Hospice facility  Care plan was discussed with patient and family.  Thank you for allowing the Palliative Medicine Team to assist in the care of this patient.  Total time: 30 minutes Prolonged billing: No     Greater than 50%  of this time was spent counseling and coordinating care related to the above assessment and plan.  Ocie Bob, AGNP-C Palliative Medicine   Please contact Palliative Medicine Team phone at [phone] for questions and concerns.

## 2021-04-26 NOTE — TOC CM/SW Note (Addendum)
1130 am Received notification pt has a bed today at [facility]. DNR and [facility] paperwork on shadow chart. Unit RN to call report to # [phone].  Isidoro Donning RN3 CCM, Heart Failure TOC CM [phone]    0924 am HF TOC CM contacted [facility] for available bed at [facility]. Isidoro Donning RN3 CCM, Heart Failure TOC CM [phone]

## 2021-04-26 NOTE — Care Management Important Message (Signed)
Important Message  Patient Details  Name: [name] [name]. MRN: [id number] Date of Birth: [birth year]   Medicare Important Message Given:  Yes     Renie Ora 04/26/2021, 8:56 AM

## 2021-05-25 ENCOUNTER — Other Ambulatory Visit: Payer: Self-pay | Admitting: Internal Medicine

## 2021-05-25 DIAGNOSIS — I5022 Chronic systolic (congestive) heart failure: Secondary | ICD-10-CM

## 2021-05-25 DIAGNOSIS — E785 Hyperlipidemia, unspecified: Secondary | ICD-10-CM

## 2021-05-26 NOTE — Telephone Encounter (Signed)
Requested medications are due for refill today.  unsure  Requested medications are on the active medications list.  no  Last refill. 12/10/2020 for all three  Future visit scheduled.   no  Notes to clinic.  All 3 medications were d/c'd on 04/26/2021 by Dr. Alanda Slim.

## 2021-05-27 DEATH — deceased

## 2021-08-23 ENCOUNTER — Other Ambulatory Visit: Payer: Self-pay | Admitting: Internal Medicine

## 2021-08-23 DIAGNOSIS — I5022 Chronic systolic (congestive) heart failure: Secondary | ICD-10-CM

## 2021-08-23 DIAGNOSIS — E785 Hyperlipidemia, unspecified: Secondary | ICD-10-CM
